# Patient Record
Sex: Male | Born: 1935 | Race: White | Hispanic: No | Smoking: Former smoker
Health system: Southern US, Community
[De-identification: ages and names within clinical notes are randomized; demographics above are authoritative.]

## PROBLEM LIST (undated history)

## (undated) DIAGNOSIS — I499 Cardiac arrhythmia, unspecified: Secondary | ICD-10-CM

## (undated) DIAGNOSIS — R0600 Dyspnea, unspecified: Secondary | ICD-10-CM

## (undated) DIAGNOSIS — I5189 Other ill-defined heart diseases: Secondary | ICD-10-CM

## (undated) DIAGNOSIS — K589 Irritable bowel syndrome without diarrhea: Secondary | ICD-10-CM

## (undated) DIAGNOSIS — K219 Gastro-esophageal reflux disease without esophagitis: Secondary | ICD-10-CM

## (undated) DIAGNOSIS — C801 Malignant (primary) neoplasm, unspecified: Secondary | ICD-10-CM

## (undated) DIAGNOSIS — I480 Paroxysmal atrial fibrillation: Secondary | ICD-10-CM

## (undated) DIAGNOSIS — K635 Polyp of colon: Secondary | ICD-10-CM

## (undated) DIAGNOSIS — I2510 Atherosclerotic heart disease of native coronary artery without angina pectoris: Secondary | ICD-10-CM

## (undated) DIAGNOSIS — K649 Unspecified hemorrhoids: Secondary | ICD-10-CM

## (undated) DIAGNOSIS — I493 Ventricular premature depolarization: Secondary | ICD-10-CM

## (undated) DIAGNOSIS — I471 Supraventricular tachycardia: Secondary | ICD-10-CM

## (undated) DIAGNOSIS — I4891 Unspecified atrial fibrillation: Secondary | ICD-10-CM

## (undated) DIAGNOSIS — H9190 Unspecified hearing loss, unspecified ear: Secondary | ICD-10-CM

## (undated) DIAGNOSIS — I10 Essential (primary) hypertension: Secondary | ICD-10-CM

## (undated) DIAGNOSIS — R0609 Other forms of dyspnea: Secondary | ICD-10-CM

## (undated) DIAGNOSIS — K449 Diaphragmatic hernia without obstruction or gangrene: Secondary | ICD-10-CM

## (undated) DIAGNOSIS — I2699 Other pulmonary embolism without acute cor pulmonale: Secondary | ICD-10-CM

## (undated) DIAGNOSIS — M1990 Unspecified osteoarthritis, unspecified site: Secondary | ICD-10-CM

## (undated) HISTORY — DX: Paroxysmal atrial fibrillation: I48.0

## (undated) HISTORY — DX: Malignant (primary) neoplasm, unspecified: C80.1

## (undated) HISTORY — DX: Unspecified hemorrhoids: K64.9

## (undated) HISTORY — DX: Atherosclerotic heart disease of native coronary artery without angina pectoris: I25.10

## (undated) HISTORY — DX: Irritable bowel syndrome, unspecified: K58.9

## (undated) HISTORY — DX: Diaphragmatic hernia without obstruction or gangrene: K44.9

## (undated) HISTORY — DX: Gastro-esophageal reflux disease without esophagitis: K21.9

## (undated) HISTORY — DX: Other ill-defined heart diseases: I51.89

## (undated) HISTORY — DX: Other forms of dyspnea: R06.09

## (undated) HISTORY — DX: Polyp of colon: K63.5

## (undated) HISTORY — DX: Supraventricular tachycardia: I47.1

## (undated) HISTORY — DX: Dyspnea, unspecified: R06.00

## (undated) HISTORY — PX: SKIN SURGERY: SHX2413

## (undated) HISTORY — PX: EYE SURGERY: SHX253

## (undated) HISTORY — DX: Other pulmonary embolism without acute cor pulmonale: I26.99

## (undated) HISTORY — DX: Ventricular premature depolarization: I49.3

## (undated) HISTORY — PX: HEMORROIDECTOMY: SUR656

---

## 2004-08-25 ENCOUNTER — Ambulatory Visit: Payer: Self-pay | Admitting: Unknown Physician Specialty

## 2006-08-23 ENCOUNTER — Ambulatory Visit: Payer: Self-pay | Admitting: Unknown Physician Specialty

## 2006-10-20 ENCOUNTER — Ambulatory Visit: Payer: Self-pay | Admitting: Ophthalmology

## 2006-11-09 ENCOUNTER — Ambulatory Visit: Payer: Self-pay | Admitting: Ophthalmology

## 2008-12-19 ENCOUNTER — Ambulatory Visit: Payer: Self-pay | Admitting: Unknown Physician Specialty

## 2009-01-09 ENCOUNTER — Ambulatory Visit: Payer: Self-pay | Admitting: Unknown Physician Specialty

## 2011-01-19 ENCOUNTER — Ambulatory Visit: Payer: Self-pay | Admitting: Unknown Physician Specialty

## 2012-09-26 DIAGNOSIS — C439 Malignant melanoma of skin, unspecified: Secondary | ICD-10-CM

## 2012-09-26 HISTORY — DX: Malignant melanoma of skin, unspecified: C43.9

## 2013-07-14 ENCOUNTER — Inpatient Hospital Stay: Payer: Self-pay | Admitting: General Surgery

## 2013-07-14 DIAGNOSIS — Q438 Other specified congenital malformations of intestine: Secondary | ICD-10-CM

## 2013-07-14 DIAGNOSIS — K562 Volvulus: Secondary | ICD-10-CM

## 2013-07-14 HISTORY — PX: COLON SURGERY: SHX602

## 2013-07-14 LAB — COMPREHENSIVE METABOLIC PANEL
ALK PHOS: 105 U/L
Albumin: 3.5 g/dL (ref 3.4–5.0)
Anion Gap: 8 (ref 7–16)
BILIRUBIN TOTAL: 0.4 mg/dL (ref 0.2–1.0)
BUN: 15 mg/dL (ref 7–18)
CREATININE: 0.96 mg/dL (ref 0.60–1.30)
Calcium, Total: 8.4 mg/dL — ABNORMAL LOW (ref 8.5–10.1)
Chloride: 105 mmol/L (ref 98–107)
Co2: 25 mmol/L (ref 21–32)
EGFR (African American): >60
Glucose: 100 mg/dL — ABNORMAL HIGH (ref 65–99)
Osmolality: 277 (ref 275–301)
Potassium: 4 mmol/L (ref 3.5–5.1)
SGOT(AST): 16 U/L (ref 15–37)
SGPT (ALT): 20 U/L (ref 12–78)
SODIUM: 138 mmol/L (ref 136–145)
TOTAL PROTEIN: 7 g/dL (ref 6.4–8.2)

## 2013-07-14 LAB — URINALYSIS, COMPLETE
BLOOD: NEGATIVE
Bacteria: NEGATIVE
Bilirubin,UR: NEGATIVE
GLUCOSE, UR: NEGATIVE mg/dL (ref 0–75)
KETONE: NEGATIVE
Leukocyte Esterase: NEGATIVE
Nitrite: NEGATIVE
PH: 5 (ref 4.5–8.0)
Protein: NEGATIVE
RBC, UR: NONE SEEN /HPF (ref 0–5)
Specific Gravity: 1.013 (ref 1.003–1.030)
WBC UR: NONE SEEN /HPF (ref 0–5)

## 2013-07-14 LAB — CBC
HCT: 47.5 % (ref 40.0–52.0)
HGB: 16.5 g/dL (ref 13.0–18.0)
MCH: 29.7 pg (ref 26.0–34.0)
MCHC: 34.7 g/dL (ref 32.0–36.0)
MCV: 86 fL (ref 80–100)
PLATELETS: 199 10*3/uL (ref 150–440)
RBC: 5.55 10*6/uL (ref 4.40–5.90)
RDW: 14.1 % (ref 11.5–14.5)
WBC: 6.9 10*3/uL (ref 3.8–10.6)

## 2013-07-14 LAB — LIPASE, BLOOD: Lipase: 103 U/L (ref 73–393)

## 2013-07-14 LAB — TROPONIN I: Troponin-I: <0.02 ng/mL

## 2013-07-15 LAB — CBC WITH DIFFERENTIAL/PLATELET
BASOS ABS: 0 10*3/uL (ref 0.0–0.1)
BASOS PCT: 0.1 %
EOS PCT: 0 %
Eosinophil #: 0 10*3/uL (ref 0.0–0.7)
HCT: 42.8 % (ref 40.0–52.0)
HGB: 14.2 g/dL (ref 13.0–18.0)
LYMPHS ABS: 1.5 10*3/uL (ref 1.0–3.6)
LYMPHS PCT: 8.8 %
MCH: 28.3 pg (ref 26.0–34.0)
MCHC: 33.1 g/dL (ref 32.0–36.0)
MCV: 86 fL (ref 80–100)
MONO ABS: 1 x10 3/mm (ref 0.2–1.0)
Monocyte %: 6 %
NEUTROS PCT: 85.1 %
Neutrophil #: 13.9 10*3/uL — ABNORMAL HIGH (ref 1.4–6.5)
Platelet: 235 10*3/uL (ref 150–440)
RBC: 5 10*6/uL (ref 4.40–5.90)
RDW: 14.1 % (ref 11.5–14.5)
WBC: 16.4 10*3/uL — AB (ref 3.8–10.6)

## 2013-07-15 LAB — BASIC METABOLIC PANEL
ANION GAP: 7 (ref 7–16)
BUN: 13 mg/dL (ref 7–18)
Calcium, Total: 7.6 mg/dL — ABNORMAL LOW (ref 8.5–10.1)
Chloride: 102 mmol/L (ref 98–107)
Co2: 25 mmol/L (ref 21–32)
Creatinine: 0.94 mg/dL (ref 0.60–1.30)
EGFR (Non-African Amer.): >60
Glucose: 167 mg/dL — ABNORMAL HIGH (ref 65–99)
OSMOLALITY: 272 (ref 275–301)
Potassium: 4.2 mmol/L (ref 3.5–5.1)
SODIUM: 134 mmol/L — AB (ref 136–145)

## 2013-07-16 LAB — CBC WITH DIFFERENTIAL/PLATELET
BASOS ABS: 0 10*3/uL (ref 0.0–0.1)
Basophil %: 0.2 %
EOS PCT: 1.3 %
Eosinophil #: 0.1 10*3/uL (ref 0.0–0.7)
HCT: 35.5 % — ABNORMAL LOW (ref 40.0–52.0)
HGB: 12.5 g/dL — AB (ref 13.0–18.0)
Lymphocyte #: 1.6 10*3/uL (ref 1.0–3.6)
Lymphocyte %: 14.4 %
MCH: 30 pg (ref 26.0–34.0)
MCHC: 35.4 g/dL (ref 32.0–36.0)
MCV: 85 fL (ref 80–100)
MONO ABS: 0.8 x10 3/mm (ref 0.2–1.0)
Monocyte %: 6.9 %
NEUTROS PCT: 77.2 %
Neutrophil #: 8.5 10*3/uL — ABNORMAL HIGH (ref 1.4–6.5)
PLATELETS: 205 10*3/uL (ref 150–440)
RBC: 4.17 10*6/uL — ABNORMAL LOW (ref 4.40–5.90)
RDW: 14 % (ref 11.5–14.5)
WBC: 11.1 10*3/uL — AB (ref 3.8–10.6)

## 2013-07-16 LAB — BASIC METABOLIC PANEL
ANION GAP: 6 — AB (ref 7–16)
BUN: 12 mg/dL (ref 7–18)
CO2: 28 mmol/L (ref 21–32)
Calcium, Total: 7.7 mg/dL — ABNORMAL LOW (ref 8.5–10.1)
Chloride: 98 mmol/L (ref 98–107)
Creatinine: 1.02 mg/dL (ref 0.60–1.30)
EGFR (African American): >60
EGFR (Non-African Amer.): >60
GLUCOSE: 132 mg/dL — AB (ref 65–99)
Osmolality: 266 (ref 275–301)
POTASSIUM: 3.8 mmol/L (ref 3.5–5.1)
Sodium: 132 mmol/L — ABNORMAL LOW (ref 136–145)

## 2013-07-17 ENCOUNTER — Encounter: Payer: Self-pay | Admitting: General Surgery

## 2013-07-17 LAB — BASIC METABOLIC PANEL
ANION GAP: 6 — AB (ref 7–16)
BUN: 9 mg/dL (ref 7–18)
Calcium, Total: 7.5 mg/dL — ABNORMAL LOW (ref 8.5–10.1)
Chloride: 104 mmol/L (ref 98–107)
Co2: 27 mmol/L (ref 21–32)
Creatinine: 0.92 mg/dL (ref 0.60–1.30)
EGFR (African American): >60
EGFR (Non-African Amer.): >60
GLUCOSE: 111 mg/dL — AB (ref 65–99)
Osmolality: 273 (ref 275–301)
POTASSIUM: 4.1 mmol/L (ref 3.5–5.1)
Sodium: 137 mmol/L (ref 136–145)

## 2013-07-18 LAB — PATHOLOGY REPORT

## 2013-07-20 ENCOUNTER — Encounter: Payer: Self-pay | Admitting: General Surgery

## 2013-07-21 ENCOUNTER — Ambulatory Visit (INDEPENDENT_AMBULATORY_CARE_PROVIDER_SITE_OTHER): Payer: Self-pay | Admitting: *Deleted

## 2013-07-21 DIAGNOSIS — K56609 Unspecified intestinal obstruction, unspecified as to partial versus complete obstruction: Secondary | ICD-10-CM

## 2013-07-21 NOTE — Progress Notes (Signed)
The staples were removed and steri strips applied. Patient came in today for a wound check.  The wound is clean, with no signs of infection noted. Follow up as scheduled.

## 2013-07-21 NOTE — Patient Instructions (Signed)
Patient to follow up as scheduled. The patient is aware to call back for any questions or concerns.  

## 2013-07-24 ENCOUNTER — Encounter: Payer: Self-pay | Admitting: General Surgery

## 2013-08-02 ENCOUNTER — Ambulatory Visit (INDEPENDENT_AMBULATORY_CARE_PROVIDER_SITE_OTHER): Payer: Self-pay | Admitting: General Surgery

## 2013-08-02 ENCOUNTER — Encounter: Payer: Self-pay | Admitting: General Surgery

## 2013-08-02 ENCOUNTER — Ambulatory Visit: Payer: Self-pay

## 2013-08-02 VITALS — BP 120/74 | HR 72 | Resp 12 | Ht 70.0 in | Wt 208.0 lb

## 2013-08-02 DIAGNOSIS — K56609 Unspecified intestinal obstruction, unspecified as to partial versus complete obstruction: Secondary | ICD-10-CM

## 2013-08-02 NOTE — Patient Instructions (Addendum)
Patient to return in three weeks.No exertional activity

## 2013-08-02 NOTE — Progress Notes (Signed)
This is a 80 year old male here today for his post op small bowel resection done on 07/14/13. Patient states he is doing okay.  Incision looks clean and well healed.Abdomen is soft and non tender, normal bowel sounds.  Lungs are clear  At surgery pt had multiple large diverticula in proximal 2 feet of jejunum causing this segment to twist on the mesentery Resection of this segment was done. Path revealed no diverticulitis but evidence of volvulus. Pt advised to avoid exertional activity for now.  F/U in 3 weeks or sooner for any abd symptoms.

## 2013-08-22 ENCOUNTER — Ambulatory Visit (INDEPENDENT_AMBULATORY_CARE_PROVIDER_SITE_OTHER): Payer: Self-pay | Admitting: General Surgery

## 2013-08-22 ENCOUNTER — Encounter: Payer: Self-pay | Admitting: General Surgery

## 2013-08-22 VITALS — BP 122/64 | HR 58 | Resp 12 | Ht 70.0 in | Wt 206.0 lb

## 2013-08-22 DIAGNOSIS — K56609 Unspecified intestinal obstruction, unspecified as to partial versus complete obstruction: Secondary | ICD-10-CM

## 2013-08-22 NOTE — Patient Instructions (Signed)
The patient is aware to call back for any questions or concerns. May resume regular activity.

## 2013-08-22 NOTE — Progress Notes (Signed)
Patient ID: Jordan Figueroa, male   DOB: [birth year], 80 y.o.   MRN: [id number]  Chief Complaint  Patient presents with  . Follow-up    small bowel obstruction    HPI Jordan Figueroa is a 80 y.o. male.  Here today for follow up small bowel obstruction. He states he is doing well, bowels moving daily, no bleeding or abdominal pain. He had small bowel resection done on 07/14/13 for volvulus of segment of jejunum with multiple large diverticula.  HPI  Past Medical History  Diagnosis Date  . IBS (irritable bowel syndrome)   . Colon polyps   . GERD (gastroesophageal reflux disease)   . Hiatal hernia   . Hemorrhoids   . Cancer     skin    Past Surgical History  Procedure Laterality Date  . Colon surgery  07/14/2013    small bowel obstruction    History reviewed. No pertinent family history.  Social History History  Substance Use Topics  . Smoking status: Former Research scientist (life sciences)  . Smokeless tobacco: Never Used  . Alcohol Use: No    No Known Allergies  Current Outpatient Prescriptions  Medication Sig Dispense Refill  . finasteride (PROSCAR) 5 MG tablet       . tamsulosin (FLOMAX) 0.4 MG CAPS capsule Take 0.4 mg by mouth daily.       No current facility-administered medications for this visit.    Review of Systems Review of Systems  Constitutional: Negative.   Respiratory: Negative.   Cardiovascular: Negative.   Gastrointestinal: Negative for abdominal pain, diarrhea, constipation and blood in stool.    Blood pressure 122/64, pulse 58, resp. rate 12, height 5\' 10"  (1.778 m), weight 206 lb (93.441 kg).  Physical Exam Physical Exam  Constitutional: He is oriented to person, place, and time. He appears well-developed and well-nourished.  Eyes: Conjunctivae are normal. No scleral icterus.  Abdominal: Soft. Normal appearance. There is no hepatosplenomegaly. There is no tenderness.  diastasis recti. Abdominal incision well healed.  Neurological: He is alert and oriented to person,  place, and time.  Skin: Skin is warm and dry.    Data Reviewed none  Assessment    Abdominal incision well healed.    Plan    Follow up as needed. May resume regular activity.       SANKAR,SEEPLAPUTHUR G 08/22/2013, 1:07 PM

## 2014-04-20 DIAGNOSIS — C4492 Squamous cell carcinoma of skin, unspecified: Secondary | ICD-10-CM

## 2014-04-20 HISTORY — DX: Squamous cell carcinoma of skin, unspecified: C44.92

## 2014-05-05 NOTE — Discharge Summary (Signed)
PATIENT NAME:  Jordan Figueroa, SALMONS MR#:  [id number] DATE OF BIRTH:  04-05-1927  DATE OF ADMISSION:  07/14/2013 DATE OF DISCHARGE:  07/19/2013  HISTORY OF PRESENT ILLNESS: This is an 81 year old, very healthy male, who presented with an ongoing history for the last 2 to 3 weeks of significant abdominal pain. The patient had been to the urgent care and to his primary care physician, who initially thought that he might have symptoms of constipation, and was recommended that he use some laxatives. The patient, however, failed to have any relief of pain. He describes the pain as being more or less persistent, located in the mid to upper abdominal area, and fairly severe at times. He has had some bowel movements and thought the pain was getting worse, and presented to the Emergency Room. His past history was significant for BPH, history of colon polyps. His last colonoscopy was about 4 to 5 years ago and that was normal. He has a history of melanoma in the distant past.  On initial evaluation in the Emergency Room, it was noted that the patient had tenderness in his abdomen, mostly in the mid and upper abdominal area without any guarding or rebound. There were no hernias. The abdomen was soft, but distended, and there were hyperactive bowel sounds.   LABORATORY DATA: Had initial chemistries which were essentially normal. White count was normal at 6900, hemoglobin was 16.   The patient underwent a CT of the abdomen and pelvis, which revealed findings consistent with a small bowel obstruction, and it appeared that he may have a volvulus involving the small intestine in the upper to mid abdominal area. There was evidence of some pneumatosis in some of the loops of the bowel. This was concerning for some vascular compromise, although the patient did not appear to be septic or acidotic from this kind of process.   Initial impression was that he had a volvulus, which was accounting for his obstructive symptoms, but also  there was evidence of pneumatosis, which was concerning for vascular compromise, and, therefore, laparotomy was recommended. The patient was agreeable.   He was taken to the operating room and found that, in fact, he had a volvulus involving the proximal 2 feet of jejunum, and this appeared to be twisted pretty much on the mesentery going towards the left side. After this was satisfactorily reversed back into position without any takedown of adhesions, it was noted that the patient had a somewhat long and thick mesentery with an extremely heavy 2 feet of   jejunum containing multiple large congenital diverticula. There was evidence of some inflammation involving the mesenteric area. There was some scarring of the lower part of the mesentery where the patient likely had twisted.However, the patient had strong pulsatile flow all the way to the bowel, which appeared normal in color. Some of the diverticula were very large measuring 6 to 7 cm in size. There were 1 or 2 scattered diverticula in the distal portion of the bowel, which appeared more or less collapsed and normal. It was decided to the resect the involvedportion of the jejunum, which was about 2 feet, with a total length of the small intestine being approximately 22 feet. With resection, reanastomosing was accomplished. The patient was  returned to floor care after surgery.   The patient had essentially an uneventful postoperative course. His abdominal pain that he experienced preoperatively was completely resolved. All he complained of was some mild incisional pain. His NG tube was draining some green bilious  material, but the quantity lessened very quickly. The patient had return of some bowel sounds with a decrease in the abdominal distention. The NG tube came out accidentally after a second postoperative day, and it was just left out. The patient subsequently had a small bowel movement and was started on a liquid diet, and advanced to a full liquid  without any difficulty.   The patient was subsequently discharged home in stable condition on July 8 to be followed as an outpatient.   FINAL DIAGNOSES:  1. 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 2. History of benign prostatic hypertrophy.   OPERATION PERFORMED: Laparotomy and small bowel resection with reanastomosis.   ____________________________ S.Robinette Haines, MD sgs:jr D: 08/09/2013 15:17:12 ET T: 08/09/2013 17:04:13 ET JOB#: [id number]  cc: S.G. Jamal Collin, MD, <Dictator> [facility] Robinette Haines MD ELECTRONICALLY SIGNED 08/10/2013 8:49

## 2014-05-05 NOTE — H&P (Signed)
Subjective/Chief Complaint abdominal pain   History of Present Illness 81 yr old healthy male presents withg 3 wk history of intermittent crampy abd pain. Was seen in urgentcare twice and treated for possible fecal retention. He has had no relief, says he has no n/v, bowels have been moving. Pain is getting worse.   Past History BPH, history of colon polyps. Last colonoscopy 4-32yr ago-normal. History of melanoma   Past Med/Surgical Hx:  prostate:   melanoma:   denie surgical hx:   ALLERGIES:  No Known Allergies:   HOME MEDICATIONS: Medication Instructions Status  Flomax 0.4 mg oral capsule 1 cap(s) orally once a day Active   Family and Social History:  Family History Non-Contributory   Place of Living Home   Review of Systems:  Subjective/Chief Complaint abd pain   Fever/Chills No   Cough No   Sputum No   Abdominal Pain Yes   Diarrhea No   Constipation No   Nausea/Vomiting No   SOB/DOE No   Chest Pain No   Dysuria No   Physical Exam:  GEN well developed, well nourished, no acute distress   HEENT pink conjunctivae, PERRL   NECK supple  No masses   RESP normal resp effort  clear BS   CARD regular rate  no murmur   ABD positive tenderness  no liver/spleen enlargement  no hernia  soft  distended  hyperactive BS  tender in mid abdomonal area, no guarding or rebound, tympanitic   LYMPH negative neck   EXTR negative cyanosis/clubbing, negative edema, strong prdal pulses   PSYCH A+O to time, place, person   Lab Results: Routine Chem:  03-Jul-15 09:30   Glucose, Serum  100  BUN 15  Creatinine (comp) 0.96  Sodium, Serum 138  Potassium, Serum 4.0  Chloride, Serum 105  CO2, Serum 25  eGFR (African American) >60  eGFR (Non-African American) >60 (eGFR values <639mmin/1.73 m2 may be an indication of chronic kidney disease (CKD). Calculated eGFR is useful in patients with stable renal function. The eGFR calculation will not be reliable in  acutely ill patients when serum creatinine is changing rapidly. It is not useful in  patients on dialysis. The eGFR calculation may not be applicable to patients at the low and high extremes of body sizes, pregnant women, and vegetarians.)  Lipase 103 (Result(s) reported on 14 Jul 2013 at 09:56AM.)  Routine Hem:  03-Jul-15 09:30   WBC (CBC) 6.9  RBC (CBC) 5.55  Hemoglobin (CBC) 16.5  Hematocrit (CBC) 47.5  Platelet Count (CBC) 199 (Result(s) reported on 14 Jul 2013 at 09:48AM.)   Radiology Results: CT:    03-Jul-15 10:22, CT Abdomen and Pelvis With Contrast  CT Abdomen and Pelvis With Contrast  REASON FOR EXAM:    (1) abdominal pain  (mid abdomen) with bloating; (2)   same;    NOTE: Nursing to G  COMMENTS:   May transport without cardiac monitor    PROCEDURE: CT  - CT ABDOMEN / PELVIS  W  - Jul 14 2013 10:22AM     CLINICAL DATA:  Abdominal pain    EXAM:  CT ABDOMEN AND PELVIS WITH CONTRAST    TECHNIQUE:  Multidetector CT imaging of the abdomen and pelvis was performed  using the standard protocol following bolus administration of  intravenous contrast.  CONTRAST:  100 mL Isovue-300    COMPARISON:  CT abdomen pelvis dated 12/19/2008    FINDINGS:  Lung bases are negative.    Multiple punctate areas  of free air adjacent to the dome of the  liver, posterior to the gastric antrum, along the central apex of  porta hepatis, and within Morison's pouch. Free air is also  appreciated within the anterior aspect of the abdomen. There is  diffuse pneumatosis throughout multiple loops of small bowel within  the left portion of the abdomen and centrally. On the coronal images  loops of small bowel demonstrate a whorled appearance centrally in  the abdomen, containing a central transition point. These findings  are concerning for a small bowel volvulus, internal hernia.    There is diverticulosis within the sigmoid colon.    There is no abdominal aortic aneurysm. The celiac, SMA,  IMA, portal  vein, SMV are opacified.    The liver is otherwise unremarkable. Multiple calcified granuloma  within the spleen. The pancreas, adrenals are unremarkable.    No abdominal or pelvic masses, free fluid, loculated fluid  collections, masses nor adenopathy. The gallbladder fossa is  unremarkable.    Stable bilateral benign-appearing renal cyst. Kidneys otherwise  unremarkable.    Multilevel spondylosis no aggressive appearing osseous lesions.     IMPRESSION:  Multiple dilated loops small bowel with areas of pneumatosis. There  are areas of free air within the abdomen. There are findings within  the central abdomen consistent with small bowel volvulus, internal  hernia, and small bowel obstruction with perforation. These results  were called by telephone at the time of interpretation on 07/14/2013  at 10:50 AM to Dr. Harvest Dark , who verbally acknowledged  these results. Surgical consultation recommended.    Diverticulosis within the colon.  Stable bilateral Bosniak type 1 cyst    Granulomatous changes within the spleen    Small hiatal hernia      Electronically Signed    By: Margaree Mackintosh M.D.    On: 07/14/2013 11:01         Verified By: Mikki Santee, M.D., MD    Assessment/Admission Diagnosis Findings consistent with bowel obstruction, perfioration. Hee is not toxic or acidotic.   Plan Exploratory laparotomy, possible bowel resction. Explained in full including procedure, risks and benefits. Pt is agreeable.   Electronic Signatures: Christene Lye (MD)  (Signed 03-Jul-15 12:36)  Authored: CHIEF COMPLAINT and HISTORY, PAST MEDICAL/SURGIAL HISTORY, ALLERGIES, HOME MEDICATIONS, FAMILY AND SOCIAL HISTORY, REVIEW OF SYSTEMS, PHYSICAL EXAM, LABS, Radiology, ASSESSMENT AND PLAN   Last Updated: 03-Jul-15 12:36 by Christene Lye (MD)

## 2014-05-05 NOTE — Op Note (Signed)
PATIENT NAME:  Jordan Figueroa, Jordan Figueroa MR#:  [id number] DATE OF BIRTH:  1927/04/16  DATE OF PROCEDURE:  07/14/2013  PREOPERATIVE DIAGNOSIS: Small bowel obstruction, possible perforation.   POSTOPERATIVE DIAGNOSES:  1.  Small bowel obstruction due to volvulus of the proximal jejunum.  2.  Multiple large diverticula of the proximal jejunum.   OPERATION PERFORMED:  1.  Exploratory laparotomy. 2.  Small bowel resection with reanastomosis.   ANESTHESIA: General.   COMPLICATIONS: None.   ESTIMATED BLOOD LOSS: 50 mL.   DRAINS: None.    SURGEON:  Mckinley Jewel, MD.  ASSISTANT: Dr. Tollie Pizza.   DESCRIPTION OF PROCEDURE: This patient was put to sleep in the supine position on the operating table. Foley catheter was inserted. Abdomen was prepped and draped as a sterile field. Timeout was performed. Vertical midline incision was made in the upper abdomen from the mid epigastrium down to the umbilicus, and carefully deepened through the layers into the peritoneal cavity.  There was no evidence of free air or any free fluid encountered.  With satisfactory exposure, exploration of the small bowel was performed. A CT scan suggested a possible volvulus. The terminal ileum was identified, then it followed all the way back towards the ligament of Treitz. It was noted that the distal half of the small bowel was relatively normal, however, approaching the area of the proximal jejunum, there appeared to be a slight twist of the mesentery with the bowels tucked underneath it.  This was then pulled out to reveal a moderately dilated proximal jejunum about 2 feet in length that appeared to be the cause of his problem. There were no adhesions to account for this; however, it was noted that this portion of the bowel to contain multiple congenital diverticula, some of them measuring up to 78 cm in size. It felt very, very bulky with some mild thickening in the wall, and there appeared to be some palpable soft fullness and in the  areas of diverticula and in the adjoining portions of the bowel. Multiple small nodules were palpated in the mesentery and there was scarring in the mesentery of this segment of the bowel suggesting the volvulus. After adequate exploration, it was decided that this was about a 2 foot length with the entire small bowel measurement being approximately 22-24 feet.  It was felt safe to resect this portion of the bowel to prevent him from doing this again. Accordingly, approximately 6 inches from the ligament of Treitz, the jejunal area was free of any abnormality.  The mesentery here was opened and just beyond the end of the row of diverticula, the small bowel mesentery was also opened. This mesentery was then scored and was then taken down with the use of the Harmonic device and also by clamping, cutting, and ligating with 2-0 Vicryl. A side-to-side anastomosis was then performed using a GIA stapling device and the remaining opening was closed and the bowel transected with a second application of the GIA. The anastomotic staple lines, that were then in the corners, were reinforced with 3-0 silk stitches. The resected portion of the bowel was subsequently opened at the end of the procedure revealing no intraluminal abnormality. The mesenteric opening was reapproximated with a running suture of 3-0 Vicryl. The bowel was then placed back in the abdominal cavity. There was a very little in the way of an omentum to cover this.  Sponge and instrument counts were declared correct x 2. The peritoneum was closed with a running suture of 2-0 Vicryl.  The fascia was then approximated with the interrupted figure-of-8 stitches of 0 PDS. The subcutaneous tissue was irrigated and the subcutaneous layer was closed with a running 2-0 Vicryl stitch, and the skin approximated with staples. Dry sterile dressing was placed. The patient tolerated the procedure well. No immediate problems were encountered. He was subsequently extubated and  returned to the recovery room in stable condition.    ____________________________ S.Robinette Haines, MD sgs:ts D: 07/15/2013 09:04:23 ET T: 07/15/2013 14:30:52 ET JOB#: [id number]  cc: Synthia Innocent. Jamal Collin, MD, <Dictator> [facility] Robinette Haines MD ELECTRONICALLY SIGNED 07/19/2013 5:43

## 2015-03-13 DIAGNOSIS — I471 Supraventricular tachycardia, unspecified: Secondary | ICD-10-CM

## 2015-03-13 HISTORY — DX: Supraventricular tachycardia: I47.1

## 2015-03-13 HISTORY — DX: Supraventricular tachycardia, unspecified: I47.10

## 2015-04-10 ENCOUNTER — Inpatient Hospital Stay
Admission: EM | Admit: 2015-04-10 | Discharge: 2015-04-13 | DRG: 308 | Disposition: A | Discharge status: Home / Self Care | Payer: Medicare Other | Attending: Internal Medicine | Admitting: Internal Medicine

## 2015-04-10 ENCOUNTER — Emergency Department: Payer: Medicare Other

## 2015-04-10 ENCOUNTER — Inpatient Hospital Stay (HOSPITAL_COMMUNITY)
Admit: 2015-04-10 | Discharge: 2015-04-10 | Disposition: A | Discharge status: Home / Self Care | Payer: Medicare Other | Attending: Specialist | Admitting: Specialist

## 2015-04-10 DIAGNOSIS — Z85828 Personal history of other malignant neoplasm of skin: Secondary | ICD-10-CM | POA: Diagnosis not present

## 2015-04-10 DIAGNOSIS — R42 Dizziness and giddiness: Secondary | ICD-10-CM | POA: Diagnosis not present

## 2015-04-10 DIAGNOSIS — I4891 Unspecified atrial fibrillation: Secondary | ICD-10-CM

## 2015-04-10 DIAGNOSIS — R002 Palpitations: Secondary | ICD-10-CM

## 2015-04-10 DIAGNOSIS — Z87891 Personal history of nicotine dependence: Secondary | ICD-10-CM | POA: Diagnosis not present

## 2015-04-10 DIAGNOSIS — M7989 Other specified soft tissue disorders: Secondary | ICD-10-CM | POA: Diagnosis present

## 2015-04-10 DIAGNOSIS — K649 Unspecified hemorrhoids: Secondary | ICD-10-CM | POA: Diagnosis present

## 2015-04-10 DIAGNOSIS — I2699 Other pulmonary embolism without acute cor pulmonale: Secondary | ICD-10-CM | POA: Diagnosis present

## 2015-04-10 DIAGNOSIS — Z79899 Other long term (current) drug therapy: Secondary | ICD-10-CM | POA: Diagnosis not present

## 2015-04-10 DIAGNOSIS — I471 Supraventricular tachycardia: Secondary | ICD-10-CM | POA: Diagnosis not present

## 2015-04-10 DIAGNOSIS — K589 Irritable bowel syndrome without diarrhea: Secondary | ICD-10-CM | POA: Diagnosis present

## 2015-04-10 DIAGNOSIS — Z86010 Personal history of colonic polyps: Secondary | ICD-10-CM | POA: Diagnosis not present

## 2015-04-10 DIAGNOSIS — J111 Influenza due to unidentified influenza virus with other respiratory manifestations: Secondary | ICD-10-CM | POA: Diagnosis not present

## 2015-04-10 DIAGNOSIS — Z823 Family history of stroke: Secondary | ICD-10-CM

## 2015-04-10 DIAGNOSIS — R0602 Shortness of breath: Secondary | ICD-10-CM

## 2015-04-10 DIAGNOSIS — K219 Gastro-esophageal reflux disease without esophagitis: Secondary | ICD-10-CM | POA: Diagnosis present

## 2015-04-10 DIAGNOSIS — K449 Diaphragmatic hernia without obstruction or gangrene: Secondary | ICD-10-CM | POA: Diagnosis present

## 2015-04-10 DIAGNOSIS — J101 Influenza due to other identified influenza virus with other respiratory manifestations: Secondary | ICD-10-CM | POA: Diagnosis present

## 2015-04-10 DIAGNOSIS — Z820 Family history of epilepsy and other diseases of the nervous system: Secondary | ICD-10-CM

## 2015-04-10 DIAGNOSIS — I248 Other forms of acute ischemic heart disease: Secondary | ICD-10-CM | POA: Diagnosis present

## 2015-04-10 DIAGNOSIS — J209 Acute bronchitis, unspecified: Secondary | ICD-10-CM | POA: Diagnosis present

## 2015-04-10 DIAGNOSIS — R609 Edema, unspecified: Secondary | ICD-10-CM

## 2015-04-10 HISTORY — PX: TRANSTHORACIC ECHOCARDIOGRAM: SHX275

## 2015-04-10 LAB — COMPREHENSIVE METABOLIC PANEL
ALT: 22 U/L (ref 17–63)
AST: 24 U/L (ref 15–41)
Albumin: 4.1 g/dL (ref 3.5–5.0)
Alkaline Phosphatase: 73 U/L (ref 38–126)
Anion gap: 7 (ref 5–15)
BUN: 16 mg/dL (ref 6–20)
CHLORIDE: 105 mmol/L (ref 101–111)
CO2: 24 mmol/L (ref 22–32)
CREATININE: 1.2 mg/dL (ref 0.61–1.24)
Calcium: 8.6 mg/dL — ABNORMAL LOW (ref 8.9–10.3)
GFR calc Af Amer: >60 mL/min (ref >60)
GFR, EST NON AFRICAN AMERICAN: 53 mL/min — AB (ref >60)
GLUCOSE: 125 mg/dL — AB (ref 65–99)
Potassium: 3.7 mmol/L (ref 3.5–5.1)
Sodium: 136 mmol/L (ref 135–145)
Total Bilirubin: 0.6 mg/dL (ref 0.3–1.2)
Total Protein: 7.2 g/dL (ref 6.5–8.1)

## 2015-04-10 LAB — CBC WITH DIFFERENTIAL/PLATELET
BASOS ABS: 0 10*3/uL (ref 0–0.1)
Basophils Relative: 0 %
Eosinophils Absolute: 0.1 10*3/uL (ref 0–0.7)
Eosinophils Relative: 1 %
HEMATOCRIT: 49.6 % (ref 40.0–52.0)
Hemoglobin: 16.7 g/dL (ref 13.0–18.0)
LYMPHS PCT: 8 %
Lymphs Abs: 0.8 10*3/uL — ABNORMAL LOW (ref 1.0–3.6)
MCH: 29.2 pg (ref 26.0–34.0)
MCHC: 33.7 g/dL (ref 32.0–36.0)
MCV: 86.7 fL (ref 80.0–100.0)
Monocytes Absolute: 0.7 10*3/uL (ref 0.2–1.0)
Monocytes Relative: 7 %
NEUTROS PCT: 84 %
Neutro Abs: 8.3 10*3/uL — ABNORMAL HIGH (ref 1.4–6.5)
PLATELETS: 176 10*3/uL (ref 150–440)
RBC: 5.72 MIL/uL (ref 4.40–5.90)
RDW: 14.1 % (ref 11.5–14.5)
WBC: 9.9 10*3/uL (ref 3.8–10.6)

## 2015-04-10 LAB — INFLUENZA PANEL BY PCR (TYPE A & B)
H1N1FLUPCR: NOT DETECTED
INFLAPCR: POSITIVE — AB
Influenza B By PCR: NEGATIVE

## 2015-04-10 LAB — MRSA PCR SCREENING: MRSA by PCR: NEGATIVE

## 2015-04-10 LAB — TROPONIN I: TROPONIN I: 0.04 ng/mL — AB (ref <0.031)

## 2015-04-10 LAB — PROTIME-INR
INR: 1.18
Prothrombin Time: 15.2 seconds — ABNORMAL HIGH (ref 11.4–15.0)

## 2015-04-10 LAB — BRAIN NATRIURETIC PEPTIDE: B Natriuretic Peptide: 87 pg/mL (ref 0.0–100.0)

## 2015-04-10 LAB — GLUCOSE, CAPILLARY: GLUCOSE-CAPILLARY: 120 mg/dL — AB (ref 65–99)

## 2015-04-10 LAB — MAGNESIUM: Magnesium: 1.9 mg/dL (ref 1.7–2.4)

## 2015-04-10 LAB — TSH: TSH: 1.329 u[IU]/mL (ref 0.350–4.500)

## 2015-04-10 MED ORDER — DILTIAZEM HCL 100 MG IV SOLR
5.0000 mg/h | Freq: Once | INTRAVENOUS | Status: AC
  Administered 2015-04-10: 5 mg/h via INTRAVENOUS
  Filled 2015-04-10: qty 100

## 2015-04-10 MED ORDER — POTASSIUM CHLORIDE CRYS ER 20 MEQ PO TBCR
20.0000 meq | EXTENDED_RELEASE_TABLET | Freq: Every day | ORAL | Status: DC
  Administered 2015-04-10 – 2015-04-13 (×4): 20 meq via ORAL
  Filled 2015-04-10 (×4): qty 1

## 2015-04-10 MED ORDER — METOPROLOL TARTRATE 1 MG/ML IV SOLN: 5.0000 mg | Freq: Once | INTRAVENOUS | Status: DC

## 2015-04-10 MED ORDER — ONDANSETRON HCL 4 MG PO TABS: 4.0000 mg | ORAL_TABLET | Freq: Four times a day (QID) | ORAL | Status: DC | PRN

## 2015-04-10 MED ORDER — ADENOSINE 12 MG/4ML IV SOLN
12.0000 mg | Freq: Once | INTRAVENOUS | Status: AC
  Administered 2015-04-10: 12 mg via INTRAVENOUS

## 2015-04-10 MED ORDER — AMIODARONE IV BOLUS ONLY 150 MG/100ML
150.0000 mg | Freq: Once | INTRAVENOUS | Status: AC
  Administered 2015-04-10: 150 mg via INTRAVENOUS
  Filled 2015-04-10: qty 100

## 2015-04-10 MED ORDER — SODIUM CHLORIDE 0.9 % IV BOLUS (SEPSIS)
1000.0000 mL | Freq: Once | INTRAVENOUS | Status: AC
  Administered 2015-04-10: 1000 mL via INTRAVENOUS

## 2015-04-10 MED ORDER — ACETAMINOPHEN 325 MG PO TABS
650.0000 mg | ORAL_TABLET | Freq: Four times a day (QID) | ORAL | Status: DC | PRN
  Administered 2015-04-10 – 2015-04-12 (×4): 650 mg via ORAL
  Filled 2015-04-10 (×4): qty 2

## 2015-04-10 MED ORDER — METOPROLOL TARTRATE 25 MG PO TABS
25.0000 mg | ORAL_TABLET | Freq: Two times a day (BID) | ORAL | Status: DC
  Administered 2015-04-11 – 2015-04-13 (×4): 25 mg via ORAL
  Filled 2015-04-10 (×5): qty 1

## 2015-04-10 MED ORDER — ONDANSETRON HCL 4 MG/2ML IJ SOLN: 4.0000 mg | Freq: Four times a day (QID) | INTRAMUSCULAR | Status: DC | PRN

## 2015-04-10 MED ORDER — SODIUM CHLORIDE 0.9% FLUSH
3.0000 mL | Freq: Two times a day (BID) | INTRAVENOUS | Status: DC
  Administered 2015-04-10 – 2015-04-13 (×6): 3 mL via INTRAVENOUS

## 2015-04-10 MED ORDER — ENOXAPARIN SODIUM 40 MG/0.4ML ~~LOC~~ SOLN: 40.0000 mg | SUBCUTANEOUS | Status: DC

## 2015-04-10 MED ORDER — ADENOSINE 12 MG/4ML IV SOLN
INTRAVENOUS | Status: AC
  Filled 2015-04-10: qty 4

## 2015-04-10 MED ORDER — DILTIAZEM HCL 25 MG/5ML IV SOLN
15.0000 mg | Freq: Once | INTRAVENOUS | Status: AC
  Administered 2015-04-10: 15 mg via INTRAVENOUS

## 2015-04-10 MED ORDER — ADENOSINE 12 MG/4ML IV SOLN
INTRAVENOUS | Status: AC
  Filled 2015-04-10: qty 8

## 2015-04-10 MED ORDER — METOPROLOL TARTRATE 25 MG PO TABS
25.0000 mg | ORAL_TABLET | Freq: Four times a day (QID) | ORAL | Status: DC
  Administered 2015-04-10: 25 mg via ORAL
  Filled 2015-04-10: qty 1

## 2015-04-10 MED ORDER — ADENOSINE 6 MG/2ML IV SOLN
6.0000 mg | Freq: Once | INTRAVENOUS | Status: AC
  Administered 2015-04-10: 6 mg via INTRAVENOUS
  Filled 2015-04-10: qty 2

## 2015-04-10 MED ORDER — ACETAMINOPHEN 650 MG RE SUPP: 650.0000 mg | Freq: Four times a day (QID) | RECTAL | Status: DC | PRN

## 2015-04-10 MED ORDER — DILTIAZEM HCL 25 MG/5ML IV SOLN
INTRAVENOUS | Status: AC
  Administered 2015-04-10: 15 mg via INTRAVENOUS
  Filled 2015-04-10: qty 5

## 2015-04-10 NOTE — ED Notes (Signed)
Attempted to call report

## 2015-04-10 NOTE — ED Notes (Signed)
Carotid massage given. Hr 96

## 2015-04-10 NOTE — ED Provider Notes (Signed)
[facility] Emergency Department Provider Note  ____________________________________________  Time seen: Approximately 1:45 PM  I have reviewed the triage vital signs and the nursing notes.   HISTORY  Chief Complaint Irregular Heart Beat    HPI Jordan Figueroa is a 82 y.o. male  who has had several episodes of tachycardia and dizziness since yesterday. Patient states he got some better last night but then got much worse again today. Patient has never had a problem with irregular heartbeat, CHF, or COPD. Patient states that his heart is beating so fast since making him dizzy and woozy feeling. Patient denies any significant chest pain associated, nausea, vomiting, or changes bowels. He states he has had a small amount of swelling on his bilateral ankles but otherwise no other acute focal deficits or findings. The patient also denies any fever, chills, cough, or any other respiratory issues other than feeling somewhat short of breath. Patient is not having any pain at this time.    Past Medical History  Diagnosis Date  . IBS (irritable bowel syndrome)   . Colon polyps   . GERD (gastroesophageal reflux disease)   . Hiatal hernia   . Hemorrhoids   . Cancer ([place])     skin    There are no active problems to display for this patient.   Past Surgical History  Procedure Laterality Date  . Colon surgery  07/14/2013    small bowel obstruction    Current Outpatient Rx  Name  Route  Sig  Dispense  Refill  . guaiFENesin (MUCINEX) 600 MG 12 hr tablet   Oral   Take 600 mg by mouth 2 (two) times daily as needed.         . pseudoephedrine (SUDAFED) 30 MG tablet   Oral   Take 30 mg by mouth every 4 (four) hours as needed for congestion.           Allergies Review of patient's allergies indicates no known allergies.  No family history on file.  Social History Social History  Substance Use Topics  . Smoking status: Former Research scientist (life sciences)  . Smokeless tobacco:  Never Used  . Alcohol Use: No    Review of Systems Constitutional: No fever/chills Eyes: No visual changes. ENT: No sore throat. Cardiovascular: Denies chest pain. patient with rapid heart rate  Respiratory:Patient with shortness of breath has been worsening over the past couple of days.. Gastrointestinal: No abdominal pain.  No nausea, no vomiting.  No diarrhea.  No constipation. Genitourinary: Negative for dysuria. Musculoskeletal: Negative for back pain. Skin: Negative for rash. Neurological: Negative for headache or focal deficits.  Patient extremely dizzy with the rapid heart rate.otherwise negative.  ____________________________________________   PHYSICAL EXAM:  VITAL SIGNS: ED Triage Vitals  Enc Vitals Group     BP 04/10/15 1354 136/57 mmHg     Pulse Rate 04/10/15 1351 64     Resp 04/10/15 1351 21     Temp 04/10/15 1354 98.6 F (37 C)     Temp Source 04/10/15 1354 Oral     SpO2 04/10/15 1351 96 %     Weight --      Height --      Head Cir --      Peak Flow --      Pain Score 04/10/15 1354 0     Pain Loc --      Pain Edu? --      Excl. in [place]? --     Constitutional: Alert and oriented.  Well appearing and in Mild distress secondary to his heart rate. The patient arrived in the ER his heart rate was in the 180s and quickly dropped to 100. Not long after patient went back to 186. Patient was given some IV Cardizem at that time. Eyes: Conjunctivae are normal. PERRL. EOMI. Head: Atraumatic. Nose: No congestion/rhinnorhea. Mouth/Throat: Mucous membranes are moist.  Oropharynx non-erythematous. Neck: No stridor.   Cardiovascular: Normal rate, regular rhythm. Grossly normal heart sounds.  Good peripheral circulation. Respiratory:  Patient with mild distress with occasional scattered wheezes throughout.  No retractions. Lungs CTAB. Gastrointestinal: Soft and nontender. No distention. No abdominal bruits. No CVA tenderness. Musculoskeletal: No lower extremity tenderness,  mild edema on the legs.  No joint effusions. Neurologic:  Normal speech and language. No gross focal neurologic deficits are appreciated. No gait instability. Skin:  Skin is warm, dry and intact. No rash noted. Psychiatric: Mood and affect are normal. Speech and behavior are normal.  ____________________________________________   LABS (all [phone] ordered are listed, but only abnormal results are displayed)  Labs Reviewed  CBC WITH DIFFERENTIAL/PLATELET - Abnormal; Notable for the following:    Neutro Abs 8.3 (*)    Lymphs Abs 0.8 (*)    All other components within normal limits  COMPREHENSIVE METABOLIC PANEL - Abnormal; Notable for the following:    Glucose, Bld 125 (*)    Calcium 8.6 (*)    GFR calc non Af Amer 53 (*)    All other components within normal limits  PROTIME-INR - Abnormal; Notable for the following:    Prothrombin Time 15.2 (*)    All other components within normal limits  TROPONIN I  BRAIN NATRIURETIC PEPTIDE   ____________________________________________  EKG  ED ECG REPORT I, Ruby Cola, the attending physician, personally viewed and interpreted this ECG.   Date: 04/10/2015  EKG Time: 1351  [id number]  Rhythm: normal EKG, normal sinus rhythm, unchanged from previous tracings, atrial fibrillation, rate 193, verses SVT  Axis: LAD  Intervals:Nonspecific intraventricular block  ST&T Change: nonspecific ST and T wave changes  ____________________________________________  RADIOLOGY Dg Chest 1 View  04/10/2015  CLINICAL DATA:  Central chest pain, cough yesterday. EXAM: CHEST 1 VIEW COMPARISON:  None. FINDINGS: Mild cardiomegaly. Lungs are clear. No effusions. No acute bony abnormality. IMPRESSION: Mild cardiomegaly.  No active disease. Electronically Signed   By: Rolm Baptise M.D.   On: 04/10/2015 14:50    ____________________________________________   PROCEDURES  Procedure(s) performed: None  Critical Care performed: Yes, see critical care  note(s) CRITICAL CARE Performed by: Ruby Cola   Total critical care time: 35 minutes  Critical care time was exclusive of separately billable procedures and treating other patients.  Critical care was necessary to treat or prevent imminent or life-threatening deterioration.  Critical care was time spent personally by me on the following activities: development of treatment plan with patient and/or surrogate as well as nursing, discussions with consultants, evaluation of patient's response to treatment, examination of patient, obtaining history from patient or surrogate, ordering and performing treatments and interventions, ordering and review of laboratory studies, ordering and review of radiographic studies, pulse oximetry and re-evaluation of patient's condition.  ____________________________________________   INITIAL IMPRESSION / ASSESSM  ertinent labs & imaging results that were available during my care of the patient were reviewed by me and considered in my medical decision making (see chart for details). 4:05 PM Pt was given 10mg  IV cardizem on arrival to the ED and once he  converted he had a rhythm that was either afib or NSR with frequent PACs.  Pt has questionable P waves.  4:05 PM Patient continued to have multiple episodes one in and out of this atrial fibrillation with RVR versus SVT. Patient was imaged time responded to IV Cardizem boluses therefore we decided to place him on IV Cardizem drip at 5 mg per hour. Patient still is pain-free and otherwise symptom-free at this time. The hospitalist was consulted for admission. ____________________________________________   FINAL CLINICAL IMPRESSION(S) / ED DIAGNOSES  Final diagnoses:  Atrial fibrillation with RVR ([place])  SVT (supraventricular tachycardia) ([place])  Dizziness       Ruby Cola, MD 04/10/15 1605

## 2015-04-10 NOTE — H&P (Signed)
[place] [facility] [place] NAME: Jordan Figueroa    MR#:  [id number]  DATE OF BIRTH:  12-May-1927  DATE OF ADMISSION:  04/10/2015  PRIMARY CARE PHYSICIAN: Juluis Pitch, MD   REQUESTING/REFERRING PHYSICIAN: Dr. Marta Antu  CHIEF COMPLAINT:   Chief Complaint  Patient presents with  . Irregular Heart Beat    HISTORY OF PRESENT ILLNESS:  Jordan Figueroa  is a 82 y.o. male with a known history of Irritable bowel syndrome, GERD, hiatal hernia who presents to the hospital due to shortness of breath, lower extremity edema and noted to be in SVT. She says that he has had worsening lower extremity edema now for the past few days and also has had some exertional dyspnea. He made an appointment to his primary care physician and when he arrived though today he was noted to be tachycardic with heart rates in the 150s to 160s. He was sent to the ER for further evaluation. In the emergency room patient received a couple doses of IV Cardizem which slowed his heart rate down but it went right back up into the 150s to 160s.  I also gave the patient 2 doses of adenosine which slowed his heart rate down but he went right back up into the 150s to 160s. He denies any chest pain, nausea, vomiting, abdominal pain, or any other associated symptoms. Given his persistent SVT hospitalist services were contacted further treatment and evaluation.  PAST MEDICAL HISTORY:   Past Medical History  Diagnosis Date  . IBS (irritable bowel syndrome)   . Colon polyps   . GERD (gastroesophageal reflux disease)   . Hiatal hernia   . Hemorrhoids   . Cancer ([place])     skin    PAST SURGICAL HISTORY:   Past Surgical History  Procedure Laterality Date  . Colon surgery  07/14/2013    small bowel obstruction    SOCIAL HISTORY:   Social History  Substance Use Topics  . Smoking status: Former Smoker -- 20.00 packs/day for 2 years    Types: Cigarettes  . Smokeless tobacco: Never  Used  . Alcohol Use: No    FAMILY HISTORY:   Family History  Problem Relation Age of Onset  . CVA Father   . Alzheimer's disease Mother     DRUG ALLERGIES:  No Known Allergies  REVIEW OF SYSTEMS:   Review of Systems  Constitutional: Negative for fever and weight loss.  HENT: Negative for congestion, nosebleeds and tinnitus.   Eyes: Negative for blurred vision, double vision and redness.  Respiratory: Positive for shortness of breath. Negative for cough and hemoptysis.   Cardiovascular: Positive for chest pain, palpitations and leg swelling. Negative for orthopnea and PND.  Gastrointestinal: Negative for nausea, vomiting, abdominal pain, diarrhea and melena.  Genitourinary: Negative for dysuria, urgency and hematuria.  Musculoskeletal: Negative for joint pain and falls.  Neurological: Negative for dizziness, tingling, sensory change, focal weakness, seizures, weakness and headaches.  Endo/Heme/Allergies: Negative for polydipsia. Does not bruise/bleed easily.  Psychiatric/Behavioral: Negative for depression and memory loss. The patient is not nervous/anxious.     MEDICATIONS AT HOME:   Prior to Admission medications   Medication Sig Start Date End Date Taking? Authorizing Provider  guaiFENesin (MUCINEX) 600 MG 12 hr tablet Take 600 mg by mouth 2 (two) times daily as needed.   Yes Historical Provider, MD  pseudoephedrine (SUDAFED) 30 MG tablet Take 30 mg by mouth every 4 (four) hours as needed for congestion.  Yes Historical Provider, MD      VITAL SIGNS:  Blood pressure 125/65, pulse 84, temperature 100.9 F (38.3 C), temperature source Oral, resp. rate 20, SpO2 95 %.  PHYSICAL EXAMINATION:  Physical Exam  GENERAL:  82 y.o.-year-old patient lying in the bed in no acute distress.  EYES: Pupils equal, round, reactive to light and accommodation. No scleral icterus. Extraocular muscles intact.  HEENT: Head atraumatic, normocephalic. Oropharynx and nasopharynx clear. No  oropharyngeal erythema, moist oral mucosa  NECK:  Supple, no jugular venous distention. No thyroid enlargement, no tenderness.  LUNGS: Normal breath sounds bilaterally, no wheezing, rales, rhonchi. No use of accessory muscles of respiration.  CARDIOVASCULAR: S1, S2 Tachycardic. No murmurs, rubs, gallops, clicks.  ABDOMEN: Soft, nontender, nondistended. Bowel sounds present. No organomegaly or mass.  EXTREMITIES: +1 edema b/l, No cyanosis, or clubbing. + 2 pedal & radial pulses b/l.   NEUROLOGIC: Cranial nerves II through XII are intact. No focal Motor or sensory deficits appreciated b/l PSYCHIATRIC: The patient is alert and oriented x 3. Good affect.  SKIN: No obvious rash, lesion, or ulcer.   LABORATORY PANEL:   CBC  Recent Labs Lab 04/10/15 1356  WBC 9.9  HGB 16.7  HCT 49.6  PLT 176   ------------------------------------------------------------------------------------------------------------------  Chemistries   Recent Labs Lab 04/10/15 1356  NA 136  K 3.7  CL 105  CO2 24  GLUCOSE 125*  BUN 16  CREATININE 1.20  CALCIUM 8.6*  AST 24  ALT 22  ALKPHOS 73  BILITOT 0.6   ------------------------------------------------------------------------------------------------------------------  Cardiac Enzymes  Recent Labs Lab 04/10/15 1356  TROPONINI <0.03   ------------------------------------------------------------------------------------------------------------------  RADIOLOGY:  Dg Chest 1 View  04/10/2015  CLINICAL DATA:  Central chest pain, cough yesterday. EXAM: CHEST 1 VIEW COMPARISON:  None. FINDINGS: Mild cardiomegaly. Lungs are clear. No effusions. No acute bony abnormality. IMPRESSION: Mild cardiomegaly.  No active disease. Electronically Signed   By: Rolm Baptise M.D.   On: 04/10/2015 14:50     IMPRESSION AND PLAN:   82 year old male with past medical history of GERD, hiatal hernia, irritable bowel syndrome presents to the hospital due to palpitations,  shortness of breath and noted to be in SVT.    #1 SVT-this is the cause of patient's palpitations. -It has not improved with Adenocard. Patient has been started on a Cardizem drip which I will continue. -I will also add oral metoprolol. I will get a cardiology consult. Check a magnesium level. -He is hemodynamically stable.  #2 shortness of breath-etiology unclear possibly related to underlying SVT. -I will hold off on IV diuretics. Check a two-dimensional echocardiogram. Get a cardiology consult. -Cycle his cardiac markers.    All the records are reviewed and case discussed with ED provider. Management plans discussed with the patient, family and they are in agreement.  CODE STATUS: Full code  TOTAL Critical Care TIME TAKING CARE OF THIS PATIENT: 50 minutes.    Henreitta Leber M.D on 04/10/2015 at 4:39 PM  Between 7am to 6pm - Pager - [phone]  After 6pm go to [email or web address] - password [email or web address] [facility]  Office  [phone]  CC: Primary care physician; Juluis Pitch, MD

## 2015-04-10 NOTE — ED Notes (Signed)
Ice packs placed on neck

## 2015-04-10 NOTE — ED Notes (Signed)
MD at bedside. 

## 2015-04-10 NOTE — ED Notes (Signed)
Pt sts he is feeling "less dizzy".  HR 95

## 2015-04-10 NOTE — ED Notes (Signed)
Per MD, keep pt of cardizem drip and give 25 mg Lopressor PO

## 2015-04-10 NOTE — ED Notes (Addendum)
MD at bedside. MD has pt cough, drink cold water and do valsalva maneuver.  Pts HR decreases w/ cough and carotid massage.

## 2015-04-10 NOTE — ED Notes (Signed)
Pt placed on 2L O2 

## 2015-04-10 NOTE — ED Notes (Signed)
Pt sent over from [facility] w/ c/o HR 190.  Pt A/O x 4, NAD.  Pt placed on ED monitor and HR 109. Pt c/o CP r/t cough.  Pt sts he went to [facility] for leg swelling.  While being triage pts HR 196.  Pt sts that he is having a "dizzy spell".

## 2015-04-10 NOTE — Consult Note (Signed)
Cardiology Consultation Note  Patient ID: Jordan Figueroa, MRN: [id number], DOB/AGE: 1927-04-30 82 y.o. Admit date: 04/10/2015   Date of Consult: 04/10/2015 Primary Physician: Juluis Pitch, MD Primary Cardiologist: New to [facility]  Chief Complaint: Weakness, SOB, and lower extremity swelling x 6 weeks Reason for Consult: New onset SVT  HPI: 82 y.o. male with h/o chronic SOB, obesity, melanoma, and hiatal hernia who presented to [facility] on 3/29 with a 6 week history of persistent SOB and a 1 day history of lower extremity swelling after visiting his PCP. He was found to be in new onset SVT with heart rates into the 160s. Cardiology is consulted for further evaluation.   He has no previously known cardiac history and has never seen a cardiologist before. He previously walked 4 miles daily up until approximately 6 weeks ago when he began to develop right foot pain. He has was diagnosed with plantar fasciitis. Since this issue his exercise tolerance has declined significantly and he now gets SOB with minimal ambulation. Never with chest pain. Today he noticed bilateral lower extremity swelling prompting him to go see his PCP. Upon his arrival he was noted to be tachycardic and was sent to the ED for further evaluation. He denies any orthopnea or PND. He does report early satiety.   Upon the patient's arrival to [facility] they were found to have troponin negative x 1, BNP 87, unremarkable CBC, K+ 3.7, Mg++ 1.9, SCr 1.20. Multiple ECGs described in detail below, CXR showed mild cardiomegaly without active disease. He has received adenosine 12 mg x 1 and IV Cardizem 10 in the ED with brief conversion to NSR, subsequently going back into SVT qith HR in the 130's. On telemetry in the ED has just converted back to NSR with PACs with HR in the 90's on metoprolol and diltiazem gtt.      Past Medical History  Diagnosis Date  . IBS (irritable bowel syndrome)   . Colon polyps   . GERD (gastroesophageal reflux disease)   .  Hiatal hernia   . Hemorrhoids   . Cancer ([place])     skin      Most Recent Cardiac Studies: None.   Surgical History:  Past Surgical History  Procedure Laterality Date  . Colon surgery  07/14/2013    small bowel obstruction     Home Meds: Prior to Admission medications   Medication Sig Start Date End Date Taking? Authorizing Provider  guaiFENesin (MUCINEX) 600 MG 12 hr tablet Take 600 mg by mouth 2 (two) times daily as needed.   Yes Historical Provider, MD  pseudoephedrine (SUDAFED) 30 MG tablet Take 30 mg by mouth every 4 (four) hours as needed for congestion.   Yes Historical Provider, MD    Inpatient Medications:  . metoprolol tartrate  25 mg Oral Q6H      Allergies: No Known Allergies  Social History   Social History  . Marital Status: Widowed    Spouse Name: N/A  . Number of Children: N/A  . Years of Education: N/A   Occupational History  . Not on file.   Social History Main Topics  . Smoking status: Former Smoker -- 20.00 packs/day for 2 years    Types: Cigarettes  . Smokeless tobacco: Never Used  . Alcohol Use: No  . Drug Use: No  . Sexual Activity: Not on file   Other Topics Concern  . Not on file   Social History Narrative     Family History  Problem Relation  Age of Onset  . CVA Father   . Alzheimer's disease Mother      Review of Systems: Review of Systems  Constitutional: Positive for malaise/fatigue. Negative for fever, chills, weight loss and diaphoresis.  HENT: Negative for congestion, ear pain, hearing loss and sore throat.   Eyes: Negative for discharge and redness.  Respiratory: Positive for shortness of breath. Negative for cough, hemoptysis, sputum production, wheezing and stridor.   Cardiovascular: Positive for leg swelling. Negative for chest pain, palpitations, orthopnea, claudication and PND.  Gastrointestinal: Negative for heartburn, nausea, vomiting, abdominal pain, diarrhea, constipation, blood in stool and melena.    Genitourinary: Negative for hematuria.  Musculoskeletal: Positive for joint pain. Negative for myalgias, back pain, falls and neck pain.       Long standing right foot pain  Skin: Negative for rash.  Neurological: Positive for weakness. Negative for dizziness, tingling, tremors, sensory change, speech change, focal weakness, seizures, loss of consciousness and headaches.  Endo/Heme/Allergies: Does not bruise/bleed easily.  Psychiatric/Behavioral: Negative for substance abuse. The patient is not nervous/anxious.   All other systems reviewed and are negative.   Labs:  Recent Labs  04/10/15 1356  TROPONINI <0.03   Lab Results  Component Value Date   WBC 9.9 04/10/2015   HGB 16.7 04/10/2015   HCT 49.6 04/10/2015   MCV 86.7 04/10/2015   PLT 176 04/10/2015    Recent Labs Lab 04/10/15 1356  NA 136  K 3.7  CL 105  CO2 24  BUN 16  CREATININE 1.20  CALCIUM 8.6*  PROT 7.2  BILITOT 0.6  ALKPHOS 73  ALT 22  AST 24  GLUCOSE 125*   No results found for: CHOL, HDL, LDLCALC, TRIG No results found for: DDIMER  Radiology/Studies:  Dg Chest 1 View  04/10/2015  CLINICAL DATA:  Central chest pain, cough yesterday. EXAM: CHEST 1 VIEW COMPARISON:  None. FINDINGS: Mild cardiomegaly. Lungs are clear. No effusions. No acute bony abnormality. IMPRESSION: Mild cardiomegaly.  No active disease. Electronically Signed   By: Rolm Baptise M.D.   On: 04/10/2015 14:50    EKG: 13:47 sinus tachycardia with PACs with supraventricular bigeminy, 105 bpm, 1st degree AV block, bifascicular block, old anterior MI. 13:51 SVT, 193 bpm, bifascicular block, old anterior MI, nonspecific st/t changes. 14:02 NSR with PACs, 91 bpm, left axis deviation, bifascicular block, old anterior MI.   Weights: There were no vitals filed for this visit.   Physical Exam: Blood pressure 136/77, pulse 163, temperature 100.9 F (38.3 C), temperature source Oral, resp. rate 23, SpO2 92 %. There is no weight on file to  calculate BMI. General: Well developed, well nourished, in no acute distress. Head: Normocephalic, atraumatic, sclera non-icteric, no xanthomas, nares are without discharge.  Neck: Negative for carotid bruits. JVD not elevated. Lungs: Clear bilaterally to auscultation without wheezes, rales, or rhonchi. Breathing is unlabored. Heart: Irregular with S1 S2. No murmurs, rubs, or gallops appreciated. Patient intermittently going into SVT with HR in the 160's. During exam he was in NSR with frequent PACs.  Abdomen: Obese, soft, non-tender, non-distended with normoactive bowel sounds. No hepatomegaly. No rebound/guarding. No obvious abdominal masses. Msk:  Strength and tone appear normal for age. Extremities: No clubbing or cyanosis. Trace pre-tibial edema along the bilateral lower extremities.  Distal pedal pulses are 2+ and equal bilaterally. Neuro: Alert and oriented X 3. No facial asymmetry. No focal deficit. Moves all extremities spontaneously. Psych:  Responds to questions appropriately with a normal affect.    Assessment  and Plan:   1. New onset SVT: -Heart rates intermittently into the 130's to 160's when he is in SVT -He has received PO metoprolol and is on diltiazem gtt and has converted to NSR with frequent PACs in the ED several times, each time lasting longer in sinus rhythm than the last -He will break with adenosine for approximately 10-12 seconds then go back into SVT -He does not feel his tachy-palpitations, but is symptomatic with his SOB -His blood pressure is beginning to become somewhat soft with readings in the 0000000 systolic -As his rate controlling medication load continues to become therapeutic his heart rates should begin to improve -Give 150 mg of IV amiodarone bolus over 60 minutes in an effort to convert to sinus and help prevent Afib -Continue PO metoprolol with next dose in AM of 3/30 -If he converts to NSR on amiodarone may want to taper diltiazem gtt and put back on  BB given his AV block -Uncertain precipitating event at this time given potassium of 3.7, magnesium of 1.9, negative troponin, and negative BNP -Will add on TSH to assess thyroid function -Given his low grade fever we have also asked IM to check for influenza  -Check echocardiogram when he is rate controlled to assess LV systolic function, wall motion, and right-sided pressure -His baseline sinus rhythm rate is in the 90's to low 100's. This may be in the setting of some dehydration vs his low-grade fever. He may benefit from gentle IV hydration and close monitoring of his temperature -He does have a 1st degree AV block with a PR interval of 233 msec on EKG. Will need to be cautious with AV nodal blocking agents  2. Lower extremity swelling/SOB: -Negative BNP -Check echo as above   -Albumin 4.1 -May benefit from compression hose   3. Hypokalemia: -Replete to 4.0  4. Obesity: -Weight loss advised -Would benefit from outpatient sleep study   5. Prior tobacco abuse: -No formal diagnosis of COPD   Signed, [name], PA-C Pager: [phone] 04/10/2015, 4:44 PM  I have seen, examined and evaluated the patient on 3/29 along with Mr. Idolina Primer, [place].  After reviewing all the available data and chart,  I agree with his findings, examination as well as impression recommendations.  Very pleasant elderly gentleman with no prior cardiac history who presented to [facility] emergency room after being seen by his PCP for edema that is minimal today. He also has been noted to have some rapid heart rates. In the emergency room he was noted to have a low-grade fever, and has had a productive cough over last couple days. Telemetry showed SVT initially in the 190s, currently in the 140-160 range.  He is been treated with IV diltiazem and now is on IV diltiazem drip. He has been given 6 and then 12 mg of atropine, and I gave him an additional 12 of atropine while in the emergency room. This temporarily broke the  SVT as did vagal maneuvers, but he quickly went reverted back into SVT.  Besides being a little short of breath of the SVT, he is not necessarily symptomatic with it. Does feel somewhat flushed and hot. Exam reveals tachycardia with no obvious murmurs rubs or gallops. Lung exam shows mild upper airway x-ray wheeze but no significant rhonchi. He does have a cough that sounds relatively wet but not apparently productive.  I discussed recommendations in detail with Thurmond Butts, and agree with his note above. I would prefer to see if we could  potentially convert him to sinus rhythm with IV amiodarone bolus, continue carefully with ties and drip overnight and plan for oral beta blocker tomorrow morning  When he is in sinus rhythm and he does have first-degree AV block, therefore we would need to be cautious for how much AV nodal agents will use for discharge. I agree with echocardiogram to evaluate his dyspnea and edema. I would expect him to have mild troponin elevation with prolonged SVT. In the absence of anginal symptoms with a heart rate of 160 beats a minute, don't think he needs an ischemic evaluation at this time.   I personally spent close to 45 minutes with the patient with at least 30 minutes of it being a care management of SVT during adenosine and performing vagal maneuvers.    Leonie Man, M.D., M.S. Interventional Cardiologist   Pager # [phone] Phone # [phone] [address] [place], [place] [address]

## 2015-04-11 ENCOUNTER — Encounter: Payer: Self-pay | Admitting: Radiology

## 2015-04-11 ENCOUNTER — Inpatient Hospital Stay: Payer: Medicare Other

## 2015-04-11 DIAGNOSIS — J111 Influenza due to unidentified influenza virus with other respiratory manifestations: Secondary | ICD-10-CM

## 2015-04-11 DIAGNOSIS — R42 Dizziness and giddiness: Secondary | ICD-10-CM | POA: Insufficient documentation

## 2015-04-11 LAB — ECHOCARDIOGRAM COMPLETE
Height: 71 in
Weight: 3608.49 oz

## 2015-04-11 LAB — TROPONIN I
TROPONIN I: 0.06 ng/mL — AB (ref <0.031)
Troponin I: 0.05 ng/mL — ABNORMAL HIGH (ref <0.031)

## 2015-04-11 LAB — GLUCOSE, CAPILLARY: GLUCOSE-CAPILLARY: 134 mg/dL — AB (ref 65–99)

## 2015-04-11 MED ORDER — GUAIFENESIN 100 MG/5ML PO SOLN
5.0000 mL | ORAL | Status: DC | PRN
  Administered 2015-04-11: 100 mg via ORAL
  Filled 2015-04-11: qty 10

## 2015-04-11 MED ORDER — RIVAROXABAN 20 MG PO TABS: 20.0000 mg | ORAL_TABLET | Freq: Every day | ORAL | Status: DC

## 2015-04-11 MED ORDER — AMIODARONE HCL 200 MG PO TABS
400.0000 mg | ORAL_TABLET | Freq: Two times a day (BID) | ORAL | Status: DC
  Administered 2015-04-11 – 2015-04-12 (×4): 400 mg via ORAL
  Filled 2015-04-11 (×4): qty 2

## 2015-04-11 MED ORDER — RIVAROXABAN 15 MG PO TABS
15.0000 mg | ORAL_TABLET | Freq: Two times a day (BID) | ORAL | Status: DC
  Administered 2015-04-11 – 2015-04-13 (×4): 15 mg via ORAL
  Filled 2015-04-11 (×4): qty 1

## 2015-04-11 MED ORDER — OSELTAMIVIR PHOSPHATE 30 MG PO CAPS
30.0000 mg | ORAL_CAPSULE | Freq: Two times a day (BID) | ORAL | Status: DC
  Administered 2015-04-11 – 2015-04-13 (×6): 30 mg via ORAL
  Filled 2015-04-11 (×6): qty 1

## 2015-04-11 MED ORDER — CEFUROXIME AXETIL 500 MG PO TABS
500.0000 mg | ORAL_TABLET | Freq: Two times a day (BID) | ORAL | Status: DC
  Administered 2015-04-11 – 2015-04-13 (×5): 500 mg via ORAL
  Filled 2015-04-11 (×6): qty 1

## 2015-04-11 MED ORDER — FUROSEMIDE 10 MG/ML IJ SOLN
20.0000 mg | Freq: Two times a day (BID) | INTRAMUSCULAR | Status: DC
  Administered 2015-04-11 – 2015-04-12 (×4): 20 mg via INTRAVENOUS
  Filled 2015-04-11 (×4): qty 2

## 2015-04-11 MED ORDER — IOPAMIDOL (ISOVUE-370) INJECTION 76%
100.0000 mL | Freq: Once | INTRAVENOUS | Status: AC | PRN
  Administered 2015-04-11: 100 mL via INTRAVENOUS

## 2015-04-11 MED ORDER — IBUPROFEN 400 MG PO TABS
400.0000 mg | ORAL_TABLET | Freq: Four times a day (QID) | ORAL | Status: DC | PRN
  Administered 2015-04-11: 400 mg via ORAL
  Filled 2015-04-11: qty 1

## 2015-04-11 NOTE — Progress Notes (Signed)
Patient: Jordan Figueroa / Admit Date: 04/10/2015 / Date of Encounter: 04/11/2015, 9:03 AM   Subjective: Patient tested positive for influenza A. Echo showed EF 60-65%, no RWMA, GR1DD, left atrium normal in size, RV systolic function normal, PASP 38 mm Hg. SOB improving. Febrile with Tmax 102.4 overnight.   Review of Systems: Review of Systems  Constitutional: Positive for fever, chills, weight loss and malaise/fatigue. Negative for diaphoresis.  HENT: Positive for congestion.   Eyes: Negative for discharge and redness.  Respiratory: Positive for cough and shortness of breath. Negative for hemoptysis, sputum production and wheezing.   Cardiovascular: Positive for leg swelling. Negative for chest pain, palpitations, orthopnea, claudication and PND.  Gastrointestinal: Negative for nausea, vomiting and abdominal pain.  Musculoskeletal: Positive for myalgias. Negative for falls.  Skin: Negative for rash.  Neurological: Positive for weakness and headaches. Negative for dizziness, tingling, tremors, sensory change, speech change, focal weakness and loss of consciousness.  Endo/Heme/Allergies: Does not bruise/bleed easily.  Psychiatric/Behavioral: The patient is not nervous/anxious.     Objective: Telemetry: converted from SVT to sinus rhythm at 6:09 PM on 3/29 and has remained in sinus rhythm with a heart rate in the 70's since Physical Exam: Blood pressure 129/57, pulse 72, temperature 101.2 F (38.4 C), temperature source Oral, resp. rate 17, height 5\' 11"  (1.803 m), weight 225 lb 8.5 oz (102.3 kg), SpO2 97 %. Body mass index is 31.47 kg/(m^2). General: Well developed, well nourished, in no acute distress. Head: Normocephalic, atraumatic, sclera non-icteric, no xanthomas, nares are without discharge. Neck: Negative for carotid bruits. JVP not elevated. Lungs: Clear bilaterally to auscultation without wheezes, rales, or rhonchi. Breathing is unlabored. Heart: RRR S1 S2 without murmurs, rubs,  or gallops.  Abdomen: Obese, soft, non-tender, non-distended with normoactive bowel sounds. No rebound/guarding. Extremities: No clubbing or cyanosis. No edema. Distal pedal pulses are 2+ and equal bilaterally. Neuro: Alert and oriented X 3. Moves all extremities spontaneously. Psych:  Responds to questions appropriately with a normal affect.   Intake/Output Summary (Last 24 hours) at 04/11/15 0903 Last data filed at 04/11/15 0700  Gross per 24 hour  Intake    425 ml  Output    700 ml  Net   -275 ml    Inpatient Medications:  . enoxaparin (LOVENOX) injection  40 mg Subcutaneous Q24H  . metoprolol tartrate  25 mg Oral BID  . oseltamivir  30 mg Oral BID  . potassium chloride  20 mEq Oral Daily  . sodium chloride flush  3 mL Intravenous Q12H   Infusions:    Labs:  Recent Labs  04/10/15 1356  NA 136  K 3.7  CL 105  CO2 24  GLUCOSE 125*  BUN 16  CREATININE 1.20  CALCIUM 8.6*  MG 1.9    Recent Labs  04/10/15 1356  AST 24  ALT 22  ALKPHOS 73  BILITOT 0.6  PROT 7.2  ALBUMIN 4.1    Recent Labs  04/10/15 1356  WBC 9.9  NEUTROABS 8.3*  HGB 16.7  HCT 49.6  MCV 86.7  PLT 176    Recent Labs  04/10/15 1356 04/10/15 2032 04/11/15 0018 04/11/15 0335  TROPONINI <0.03 0.04* 0.05* 0.06*   Invalid input(s): POCBNP No results for input(s): HGBA1C in the last 72 hours.   Weights: Filed Weights   04/10/15 1800  Weight: 225 lb 8.5 oz (102.3 kg)     Radiology/Studies:  Dg Chest 1 View  04/10/2015  CLINICAL DATA:  Central chest pain, cough yesterday.  EXAM: CHEST 1 VIEW COMPARISON:  None. FINDINGS: Mild cardiomegaly. Lungs are clear. No effusions. No acute bony abnormality. IMPRESSION: Mild cardiomegaly.  No active disease. Electronically Signed   By: Rolm Baptise M.D.   On: 04/10/2015 14:50     Assessment and Plan   1. New onset SVT: -Converted to sinus rhythm with heart rate in the 70's at 6:09 PM on 3/29 s/p amiodarone bolus and has remained in sinus  rhythm since -Will place patient on PO amiodarone 400 mg bid for now while he is dealing with influenza A as he is at high risk of recurrent arrhythmia -Continue metoprolol 25 mg bid  2. Influenza A: -Tamiflu per IM -Supportive care  3. Lower extremity swelling/SOB: -Negative BNP -Echo showed EF 60-65%, no RWMA, GR1DD, mild MR, left atrium normal in size, RV systolic function normal, PASP 38 mm Hg -Consider compression hose -SOB possibly in the setting of deconditioning and influenza A  4. Elevated troponin: -Likely supply demand ischemia in the setting of the patient's SVT and influenza A -No angina  -Normal echo  5. Hypokalemia:   -Bmet pending   Melvern Banker, PA-C Pager: [phone] 04/11/2015, 9:03 AM   Attending Note Patient seen and examined, agree with detailed note above,  Patient presentation and plan discussed on rounds.   General malaise today, flu positive Now on Tamiflu Normal sinus rhythm with no SVT since amiodarone started Now on amiodarone by mouth dose twice a day Poor appetite this morning otherwise stable No change on clinical exam, lungs with coarse breath sounds, soft abdomen, heart rate regular  --- Would continue amiodarone 400 mg twice a day for SVT, We can wean this off later once flu symptoms have resolved   Signed: Esmond Plants  M.D., Ph.D. [facility]

## 2015-04-11 NOTE — Progress Notes (Signed)
Pt has remained A & O x 4. Bilateral upper lobe expiratory wheezes on 3LNC. Pt has been in sinus rhythm with multifocal PVCs. Cardizem gtt has been d/c today and pt is now on Amiodarone PO. Pt was generally appearing unwell today upon shift assessment with a Tmax of 101.2-PRN 650mg  APAP given. Since this am pt appears better and has been more talkative. Daughter at bedside. Orders to transfer pt to floor and report given to Lavella Lemons, Therapist, sports.

## 2015-04-11 NOTE — Care Management (Signed)
Patient presented to ED from [facility] for irregular heart beat.  Admitted to icu stepdown and for transferred out to 2A.  There is discussion regarding need for chronic anticoagulation

## 2015-04-11 NOTE — Progress Notes (Signed)
Patient resting overnight, no complaints of pain or shortness of breath. Cardiazem continues at 5mg /hr until bag complete. No SVT or significant ectopy noted. Daughter at bedside updated on POC. See [facility] for further assessment. Will continue to assess for changes/need.

## 2015-04-11 NOTE — Progress Notes (Signed)
[place] at [facility]                                                                                                                                                                                            Patient Demographics   Jordan Figueroa, is a 82 y.o. male, DOB - 03-20-1927, [id number]  Admit date - 04/10/2015   Admitting Physician Henreitta Leber, MD  Outpatient Primary MD for the patient is Jordan Lovie Macadamia, MD   LOS - 1  Subjective: Patient feeling little better but still short of breath states that he has a dry cough. No further SVT    Review of Systems:   CONSTITUTIONAL: No documented fever. No fatigue, weakness. No weight gain, no weight loss.  EYES: No blurry or double vision.  ENT: No tinnitus. No postnasal drip. No redness of the oropharynx.  RESPIRATORY: Positive cough, no wheeze, no hemoptysis. Positive dyspnea.  CARDIOVASCULAR: No chest pain. No orthopnea. No palpitations. No syncope.  GASTROINTESTINAL: No nausea, no vomiting or diarrhea. No abdominal pain. No melena or hematochezia.  GENITOURINARY: No dysuria or hematuria.  ENDOCRINE: No polyuria or nocturia. No heat or cold intolerance.  HEMATOLOGY: No anemia. No bruising. No bleeding.  INTEGUMENTARY: No rashes. No lesions.  MUSCULOSKELETAL: No arthritis. No swelling. No gout.  NEUROLOGIC: No numbness, tingling, or ataxia. No seizure-type activity.  PSYCHIATRIC: No anxiety. No insomnia. No ADD.    Vitals:   Filed Vitals:   04/11/15 0800 04/11/15 0900 04/11/15 0918 04/11/15 0938  BP: 112/56 121/61  121/61  Pulse: 72 77  77  Temp: 101.2 F (38.4 C)  100.6 F (38.1 C)   TempSrc: Oral  Oral   Resp: 18 17    Height:      Weight:      SpO2: 93% 94%      Wt Readings from Last 3 Encounters:  04/10/15 102.3 kg (225 lb 8.5 oz)  08/22/13 93.441 kg (206 lb)  08/02/13 94.348 kg (208 lb)     Intake/Output Summary (Last 24 hours) at 04/11/15 1048 Last data filed  at 04/11/15 0700  Gross per 24 hour  Intake    425 ml  Output    700 ml  Net   -275 ml    Physical Exam:   GENERAL: Pleasant-appearing in no apparent distress.  HEAD, EYES, EARS, NOSE AND THROAT: Atraumatic, normocephalic. Extraocular muscles are intact. Pupils equal and reactive to light. Sclerae anicteric. No conjunctival injection. No oro-pharyngeal erythema.  NECK: Supple. There is no jugular venous distention. No bruits, no lymphadenopathy, no thyromegaly.  HEART: Regular rate and rhythm,.  No murmurs, no rubs, no clicks.  LUNGS: Clear to auscultation bilaterally. No rales or rhonchi. No wheezes.  ABDOMEN: Soft, flat, nontender, nondistended. Has good bowel sounds. No hepatosplenomegaly appreciated.  EXTREMITIES: No evidence of any cyanosis, clubbing, or 2+ peripheral edema.  +2 pedal and radial pulses bilaterally.  NEUROLOGIC: The patient is alert, awake, and oriented x3 with no focal motor or sensory deficits appreciated bilaterally.  SKIN: Moist and warm with no rashes appreciated.  Psych: Not anxious, depressed LN: No inguinal LN enlargement    Antibiotics   Anti-infectives    Start     Dose/Rate Route Frequency Ordered Stop   04/11/15 1700  cefUROXime (CEFTIN) tablet 500 mg     500 mg Oral 2 times daily with meals 04/11/15 1047     04/11/15 0130  oseltamivir (TAMIFLU) capsule 30 mg     30 mg Oral 2 times daily 04/11/15 0117 04/15/15 2159      Medications   Scheduled Meds: . amiodarone  400 mg Oral BID  . cefUROXime  500 mg Oral BID WC  . enoxaparin (LOVENOX) injection  40 mg Subcutaneous Q24H  . furosemide  20 mg Intravenous Q12H  . metoprolol tartrate  25 mg Oral BID  . oseltamivir  30 mg Oral BID  . potassium chloride  20 mEq Oral Daily  . sodium chloride flush  3 mL Intravenous Q12H   Continuous Infusions:  PRN Meds:.acetaminophen **OR** acetaminophen, ondansetron **OR** ondansetron (ZOFRAN) IV   Data Review:   Micro Results Recent Results (from the  past 240 hour(s))  MRSA PCR Screening     Status: None   Collection Time: 04/10/15  6:42 PM  Result Value Ref Range Status   MRSA by PCR NEGATIVE NEGATIVE Final    Comment:        The GeneXpert MRSA Assay (FDA approved for NASAL specimens only), is one component of a comprehensive MRSA colonization surveillance program. It is not intended to diagnose MRSA infection nor to guide or monitor treatment for MRSA infections.     Radiology Reports Dg Chest 1 View  04/10/2015  CLINICAL DATA:  Central chest pain, cough yesterday. EXAM: CHEST 1 VIEW COMPARISON:  None. FINDINGS: Mild cardiomegaly. Lungs are clear. No effusions. No acute bony abnormality. IMPRESSION: Mild cardiomegaly.  No active disease. Electronically Signed   By: Rolm Baptise M.D.   On: 04/10/2015 14:50     CBC  Recent Labs Lab 04/10/15 1356  WBC 9.9  HGB 16.7  HCT 49.6  PLT 176  MCV 86.7  MCH 29.2  MCHC 33.7  RDW 14.1  LYMPHSABS 0.8*  MONOABS 0.7  EOSABS 0.1  BASOSABS 0.0    Chemistries   Recent Labs Lab 04/10/15 1356  NA 136  K 3.7  CL 105  CO2 24  GLUCOSE 125*  BUN 16  CREATININE 1.20  CALCIUM 8.6*  MG 1.9  AST 24  ALT 22  ALKPHOS 73  BILITOT 0.6   ------------------------------------------------------------------------------------------------------------------ estimated creatinine clearance is 54.8 mL/min (by C-G formula based on Cr of 1.2). ------------------------------------------------------------------------------------------------------------------ No results for input(s): HGBA1C in the last 72 hours. ------------------------------------------------------------------------------------------------------------------ No results for input(s): CHOL, HDL, LDLCALC, TRIG, CHOLHDL, LDLDIRECT in the last 72 hours. ------------------------------------------------------------------------------------------------------------------  Recent Labs  04/10/15 1407  TSH 1.329    ------------------------------------------------------------------------------------------------------------------ No results for input(s): VITAMINB12, FOLATE, FERRITIN, TIBC, IRON, RETICCTPCT in the last 72 hours.  Coagulation profile  Recent Labs Lab 04/10/15 1356  INR 1.18    No results for input(s): DDIMER in  the last 72 hours.  Cardiac Enzymes  Recent Labs Lab 04/10/15 2032 04/11/15 0018 04/11/15 0335  TROPONINI 0.04* 0.05* 0.06*   ------------------------------------------------------------------------------------------------------------------ Invalid input(s): POCBNP    Assessment & Plan   82 year old male with past medical history of GERD, hiatal hernia, irritable bowel syndrome presents to the hospital due to palpitations, shortness of breath and noted to be in SVT.   #1 SVT- No further episodes appreciate cardiology input Continue amiodarone twice a day   #2 shortness of breath-etiology unclear possibly related to underlying SVT. We'll go ahead and start patient on Ceftin for acute bronchitis He has lower extremity swelling but chest x-ray is negative I will try some IV Lasix If shortness of breath persists we'll get a CT scan of the chest to further evaluate  #3 influenza continue Tamiflu  #4 miscellaneous Lovenox for DVT prophylax       Code Status Orders        Start     Ordered   04/10/15 1834  Full code   Continuous     04/10/15 1833    Code Status History    Date Active Date Inactive Code Status Order ID Comments User Context   This patient has a current code status but no historical code status.    Advance Directive Documentation        Most Recent Value   Type of Advance Directive  Living will   Pre-existing out of facility DNR order (yellow form or pink MOST form)     "MOST" Form in Place?             Consults Cardiology   DVT Prophylaxis  Lovenox   Lab Results  Component Value Date   PLT 176 04/10/2015      Time Spent in minutes   35 minutes  Greater than 50% of time spent in care coordination and counseling patient regarding the condition and plan of care.   Dustin Flock M.D on 04/11/2015 at 10:48 AM  Between 7am to 6pm - Pager - [phone]  After 6pm go to [email or web address] - password [email or web address] [place]   Office  [phone]

## 2015-04-11 NOTE — Plan of Care (Signed)
Problem: Safety: Goal: Ability to remain free from injury will improve Outcome: Progressing Fall precautions in place  Problem: Pain Managment: Goal: General experience of comfort will improve Outcome: Progressing Prn medications  Problem: Physical Regulation: Goal: Will remain free from infection Outcome: Progressing Po antibiotics  Problem: Tissue Perfusion: Goal: Risk factors for ineffective tissue perfusion will decrease Outcome: Progressing SQ Lovenox  Problem: Cardiac: Goal: Ability to achieve and maintain adequate cardiopulmonary perfusion will improve Outcome: Progressing Po amiodarone

## 2015-04-11 NOTE — Progress Notes (Addendum)
ANTICOAGULATION CONSULT NOTE - Initial Consult  Pharmacy Consult for Xarelto  Indication: pulmonary embolus  No Known Allergies  Patient Measurements: Height: 5\' 11"  (180.3 cm) Weight: 223 lb 3.2 oz (101.243 kg) IBW/kg (Calculated) : 75.3 Heparin Dosing Weight:   Vital Signs: Temp: 100 F (37.8 C) (03/30 1445) Temp Source: Oral (03/30 1445) BP: 111/55 mmHg (03/30 1445) Pulse Rate: 70 (03/30 1445)  Labs:  Recent Labs  04/10/15 1356 04/10/15 2032 04/11/15 0018 04/11/15 0335  HGB 16.7  --   --   --   HCT 49.6  --   --   --   PLT 176  --   --   --   LABPROT 15.2*  --   --   --   INR 1.18  --   --   --   CREATININE 1.20  --   --   --   TROPONINI <0.03 0.04* 0.05* 0.06*    Estimated Creatinine Clearance: 54.6 mL/min (by C-G formula based on Cr of 1.2).   Medical History: Past Medical History  Diagnosis Date  . IBS (irritable bowel syndrome)   . Colon polyps   . GERD (gastroesophageal reflux disease)   . Hiatal hernia   . Hemorrhoids   . Cancer ([place])     skin    Medications:  Prescriptions prior to admission  Medication Sig Dispense Refill Last Dose  . guaiFENesin (MUCINEX) 600 MG 12 hr tablet Take 600 mg by mouth 2 (two) times daily as needed.   04/09/2015 at Unknown time  . pseudoephedrine (SUDAFED) 30 MG tablet Take 30 mg by mouth every 4 (four) hours as needed for congestion.   04/09/2015 at Unknown time    Assessment: TBW = 101.2 kg ,  SrCr = 1.2  CrCl (TBW) = 64.42 ml/min  No prior anticoag noted  Goal of Therapy:  resolution of PE   Plan:  Xarelto 15 mg PO BID ordered to start 3/30 X 21 days followed by  Xarelto 20 mg PO Q Supper to start 4/21 @ 17:00.   Jordan Figueroa D 04/11/2015,4:04 PM

## 2015-04-11 NOTE — Progress Notes (Signed)
82 yo wm transferred from ICU 12 to room 244 via bed.  No distress on 2LO2 per [place].  Cardiac monitor placed on pt and verified.  Denies chest pain.  Lungs diminished with scattered wheezes bil.  Pt on droplet precautions for flu.  1+ edema lower legs bil.  SL lt ac/rt fa inplace.  Daughter at bedside.  Oriented to room and surroundings.  CB in reach, SR up x 2, bed alarm on.

## 2015-04-12 ENCOUNTER — Inpatient Hospital Stay: Payer: Medicare Other

## 2015-04-12 MED ORDER — PREDNISONE 20 MG PO TABS
40.0000 mg | ORAL_TABLET | Freq: Once | ORAL | Status: AC
  Administered 2015-04-12: 40 mg via ORAL
  Filled 2015-04-12: qty 2

## 2015-04-12 MED ORDER — GUAIFENESIN ER 600 MG PO TB12
600.0000 mg | ORAL_TABLET | Freq: Two times a day (BID) | ORAL | Status: DC
  Administered 2015-04-12 – 2015-04-13 (×3): 600 mg via ORAL
  Filled 2015-04-12 (×3): qty 1

## 2015-04-12 NOTE — Progress Notes (Signed)
ANTICOAGULATION CONSULT NOTE - Initial Consult  Pharmacy Consult for Xarelto  Indication: pulmonary embolus  No Known Allergies  Patient Measurements: Height: 5\' 11"  (180.3 cm) Weight: 223 lb 3.2 oz (101.243 kg) IBW/kg (Calculated) : 75.3 Heparin Dosing Weight:   Vital Signs: Temp: 99.2 F (37.3 C) (03/31 0745) Temp Source: Oral (03/31 0745) BP: 122/67 mmHg (03/31 0745) Pulse Rate: 74 (03/31 0745)  Labs:  Recent Labs  04/10/15 1356 04/10/15 2032 04/11/15 0018 04/11/15 0335  HGB 16.7  --   --   --   HCT 49.6  --   --   --   PLT 176  --   --   --   LABPROT 15.2*  --   --   --   INR 1.18  --   --   --   CREATININE 1.20  --   --   --   TROPONINI <0.03 0.04* 0.05* 0.06*    Estimated Creatinine Clearance: 54.6 mL/min (by C-G formula based on Cr of 1.2).   Medical History: Past Medical History  Diagnosis Date  . IBS (irritable bowel syndrome)   . Colon polyps   . GERD (gastroesophageal reflux disease)   . Hiatal hernia   . Hemorrhoids   . Cancer [facility])     skin    Assessment: Pharmacy consulted to dose rivaroxaban for newly diagnosed PE. Patient was not taking anticoagulants prior to admission. Patient was started on rivaroxaban yesterday.  CrCl: 54 mL/min  Goal of Therapy:  resolution of PE   Plan:  Continue current regimen of rivaroxaban 15 mg PO BID x 21 days followed by 20 mg daily with supper to start on 4/21.   Lenis Noon, PharmD Clinical Pharmacist 04/12/2015,9:43 AM

## 2015-04-12 NOTE — Plan of Care (Signed)
Problem: Safety: Goal: Ability to remain free from injury will improve Outcome: Progressing Fall precautions in place  Problem: Pain Managment: Goal: General experience of comfort will improve Outcome: Progressing Prn medications  Problem: Physical Regulation: Goal: Ability to maintain clinical measurements within normal limits will improve Outcome: Progressing PT eval today Goal: Will remain free from infection Outcome: Progressing antibiotics  Problem: Tissue Perfusion: Goal: Risk factors for ineffective tissue perfusion will decrease Outcome: Progressing PO Xarelto  Problem: Activity: Goal: Risk for activity intolerance will decrease Outcome: Progressing PT evaluation  Problem: Cardiac: Goal: Ability to achieve and maintain adequate cardiopulmonary perfusion will improve Outcome: Progressing PO amiodarone and xarelto

## 2015-04-12 NOTE — Care Management Note (Signed)
Case Management Note  Patient Details  Name: Jordan Figueroa MRN: [id number] Date of Birth: [birth year]  Subjective/Objective:  Met with patient at bedside. He lives at home. PCP is Dr. Lovie Macadamia.  He states he is independent, active and drives. He has been less mobile lately due to foot pain. Denies the use of any DME. Positive for a PE. Now on Xarelto. Coupon given for free 30 day trial. Venous Leg Doppler pending to R/O DVT. PT eval pending.  Patient denies the need for any home health services but is willing to work with PT and go from there.                  Action/Plan:   Expected Discharge Date:  04/15/15               Expected Discharge Plan:  Home/Self Care  In-House Referral:     Discharge planning Services  CM Consult  Post Acute Care Choice:    Choice offered to:     DME Arranged:    DME Agency:     HH Arranged:    HH Agency:     Status of Service:  Completed, signed off  Medicare Important Message Given:    Date Medicare IM Given:    Medicare IM give by:    Date Additional Medicare IM Given:    Additional Medicare Important Message give by:     If discussed at [place] of Stay Meetings, dates discussed:    Additional Comments:  Jolly Mango, RN 04/12/2015, 2:39 PM

## 2015-04-12 NOTE — Progress Notes (Signed)
[name] [facility]                                                                                                                                                                                            Patient Demographics   Jordan Figueroa, is a 82 y.o. male, DOB - 12-Oct-1927, [id number]  Admit date - 04/10/2015   Admitting Physician Henreitta Leber, MD  Outpatient Primary MD for the patient is Jordan Lovie Macadamia, MD   LOS - 2  Subjective: No further SVT noted patient had a CT per PE which shows pulmonary embolism. Patient does report that he had some issues with this foot and what had a sedentary lifestyle recently. Still feels short of breath and has a cough.   Review of Systems:   CONSTITUTIONAL: No documented fever. No fatigue, weakness. No weight gain, no weight loss.  EYES: No blurry or double vision.  ENT: No tinnitus. No postnasal drip. No redness of the oropharynx.  RESPIRATORY: Positive cough, no wheeze, no hemoptysis. Positive dyspnea.  CARDIOVASCULAR: No chest pain. No orthopnea. No palpitations. No syncope.  GASTROINTESTINAL: No nausea, no vomiting or diarrhea. No abdominal pain. No melena or hematochezia.  GENITOURINARY: No dysuria or hematuria.  ENDOCRINE: No polyuria or nocturia. No heat or cold intolerance.  HEMATOLOGY: No anemia. No bruising. No bleeding.  INTEGUMENTARY: No rashes. No lesions.  MUSCULOSKELETAL: No arthritis. No swelling. No gout.  NEUROLOGIC: No numbness, tingling, or ataxia. No seizure-type activity.  PSYCHIATRIC: No anxiety. No insomnia. No ADD.    Vitals:   Filed Vitals:   04/11/15 2024 04/11/15 2357 04/12/15 0540 04/12/15 0745  BP: 109/52 114/63 132/64 122/67  Pulse: 68 68 79 74  Temp: 99.9 F (37.7 C) 98.9 F (37.2 C) 99 F (37.2 C) 99.2 F (37.3 C)  TempSrc: Oral Oral Oral Oral  Resp:  20 20 17   Height:      Weight:      SpO2: 94% 96% 95% 95%    Wt Readings from Last 3 Encounters:   04/11/15 101.243 kg (223 lb 3.2 oz)  08/22/13 93.441 kg (206 lb)  08/02/13 94.348 kg (208 lb)     Intake/Output Summary (Last 24 hours) at 04/12/15 0919 Last data filed at 04/12/15 0540  Gross per 24 hour  Intake    243 ml  Output   2370 ml  Net  -2127 ml    Physical Exam:   GENERAL: Pleasant-appearing in no apparent distress.  HEAD, EYES, EARS, NOSE AND THROAT: Atraumatic, normocephalic. Extraocular muscles are intact. Pupils equal and reactive to light. Sclerae anicteric. No conjunctival  injection. No oro-pharyngeal erythema.  NECK: Supple. There is no jugular venous distention. No bruits, no lymphadenopathy, no thyromegaly.  HEART: Regular rate and rhythm,. No murmurs, no rubs, no clicks.  LUNGS: Clear to auscultation bilaterally. No rales or rhonchi. No wheezes.  ABDOMEN: Soft, flat, nontender, nondistended. Has good bowel sounds. No hepatosplenomegaly appreciated.  EXTREMITIES: No evidence of any cyanosis, clubbing, or 2+ peripheral edema.  +2 pedal and radial pulses bilaterally.  NEUROLOGIC: The patient is alert, awake, and oriented x3 with no focal motor or sensory deficits appreciated bilaterally.  SKIN: Moist and warm with no rashes appreciated.  Psych: Not anxious, depressed LN: No inguinal LN enlargement    Antibiotics   Anti-infectives    Start     Dose/Rate Route Frequency Ordered Stop   04/11/15 1200  cefUROXime (CEFTIN) tablet 500 mg     500 mg Oral 2 times daily with meals 04/11/15 1047     04/11/15 0130  oseltamivir (TAMIFLU) capsule 30 mg     30 mg Oral 2 times daily 04/11/15 0117 04/15/15 2159      Medications   Scheduled Meds: . amiodarone  400 mg Oral BID  . cefUROXime  500 mg Oral BID WC  . furosemide  20 mg Intravenous Q12H  . guaiFENesin  600 mg Oral BID  . metoprolol tartrate  25 mg Oral BID  . oseltamivir  30 mg Oral BID  . potassium chloride  20 mEq Oral Daily  . predniSONE  40 mg Oral Once  . Rivaroxaban  15 mg Oral BID WC  . [START ON  05/03/2015] rivaroxaban  20 mg Oral Q supper  . sodium chloride flush  3 mL Intravenous Q12H   Continuous Infusions:  PRN Meds:.acetaminophen **OR** acetaminophen, guaiFENesin, ibuprofen, ondansetron **OR** ondansetron (ZOFRAN) IV   Data Review:   Micro Results Recent Results (from the past 240 hour(s))  MRSA PCR Screening     Status: None   Collection Time: 04/10/15  6:42 PM  Result Value Ref Range Status   MRSA by PCR NEGATIVE NEGATIVE Final    Comment:        The GeneXpert MRSA Assay (FDA approved for NASAL specimens only), is one component of a comprehensive MRSA colonization surveillance program. It is not intended to diagnose MRSA infection nor to guide or monitor treatment for MRSA infections.     Radiology Reports Dg Chest 1 View  04/10/2015  CLINICAL DATA:  Central chest pain, cough yesterday. EXAM: CHEST 1 VIEW COMPARISON:  None. FINDINGS: Mild cardiomegaly. Lungs are clear. No effusions. No acute bony abnormality. IMPRESSION: Mild cardiomegaly.  No active disease. Electronically Signed   By: Rolm Baptise M.D.   On: 04/10/2015 14:50   Ct Angio Chest Pe W/cm &/or Wo Cm  04/11/2015  CLINICAL DATA:  Shortness of Breath EXAM: CT ANGIOGRAPHY CHEST WITH CONTRAST TECHNIQUE: Multidetector CT imaging of the chest was performed using the standard protocol during bolus administration of intravenous contrast. Multiplanar CT image reconstructions and MIPs were obtained to evaluate the vascular anatomy. CONTRAST:  100 mL Isovue 370. COMPARISON:  04/10/2015 FINDINGS: Lungs are well aerated bilaterally. A tiny calcified nodule is noted in the left apex on image number 20 of series 6 consistent with a small calcified granuloma. No other nodular changes are seen. Very minimal atelectatic changes are noted in the right upper lobe along the major fissure. No sizable effusion is noted. The thoracic inlet is within normal limits. The thoracic aorta is unremarkable. The pulmonary artery shows  a  normal branching pattern. A tiny filling defect is noted in the right main pulmonary artery extending into right middle lobe pulmonary arterial branches consistent with a small pulmonary embolus. No significant hilar or mediastinal adenopathy is noted. Some calcified lymph nodes are noted within the right hilum consistent with prior granulomatous disease. Very mild bronchial thickening is noted which may be related to a degree of bronchitis. Mild coronary calcifications are seen. The visualized upper abdomen is within normal limits. The osseous structures show no acute abnormality. Degenerative changes of the thoracic spine are seen. Review of the MIP images confirms the above findings. IMPRESSION: Mild bronchial thickening which may be related to a degree of bronchitis. Tiny filling defect within the right middle lobe arterial branches consistent with single pulmonary embolus. No other definitive embolus is seen. Electronically Signed   By: Inez Catalina M.D.   On: 04/11/2015 14:36     CBC  Recent Labs Lab 04/10/15 1356  WBC 9.9  HGB 16.7  HCT 49.6  PLT 176  MCV 86.7  MCH 29.2  MCHC 33.7  RDW 14.1  LYMPHSABS 0.8*  MONOABS 0.7  EOSABS 0.1  BASOSABS 0.0    Chemistries   Recent Labs Lab 04/10/15 1356  NA 136  K 3.7  CL 105  CO2 24  GLUCOSE 125*  BUN 16  CREATININE 1.20  CALCIUM 8.6*  MG 1.9  AST 24  ALT 22  ALKPHOS 73  BILITOT 0.6   ------------------------------------------------------------------------------------------------------------------ estimated creatinine clearance is 54.6 mL/min (by C-G formula based on Cr of 1.2). ------------------------------------------------------------------------------------------------------------------ No results for input(s): HGBA1C in the last 72 hours. ------------------------------------------------------------------------------------------------------------------ No results for input(s): CHOL, HDL, LDLCALC, TRIG, CHOLHDL, LDLDIRECT  in the last 72 hours. ------------------------------------------------------------------------------------------------------------------  Recent Labs  04/10/15 1407  TSH 1.329   ------------------------------------------------------------------------------------------------------------------ No results for input(s): VITAMINB12, FOLATE, FERRITIN, TIBC, IRON, RETICCTPCT in the last 72 hours.  Coagulation profile  Recent Labs Lab 04/10/15 1356  INR 1.18    No results for input(s): DDIMER in the last 72 hours.  Cardiac Enzymes  Recent Labs Lab 04/10/15 2032 04/11/15 0018 04/11/15 0335  TROPONINI 0.04* 0.05* 0.06*   ------------------------------------------------------------------------------------------------------------------ Invalid input(s): POCBNP    Assessment & Plan   82 year old male with past medical history of GERD, hiatal hernia, irritable bowel syndrome presents to the hospital due to palpitations, shortness of breath and noted to be in SVT.   #1 SVT- No further episodes appreciate cardiology input Continue amiodarone twice a day   #2 shortness of breath- Due to acute bronchitis as well as an acute pulmonary embolism We'll try to wean his oxygen Continue Ceftin  #3 influenza continue Tamiflu  #4 Pulmonary embolism continue Xarelto, I would check Doppler of his lower extremity  #5 DVT prophylaxis with Xarelto     Code Status Orders        Start     Ordered   04/10/15 1834  Full code   Continuous     04/10/15 1833    Code Status History    Date Active Date Inactive Code Status Order ID Comments User Context   This patient has a current code status but no historical code status.    Advance Directive Documentation        Most Recent Value   Type of Advance Directive  Living will   Pre-existing out of facility DNR order (yellow form or pink MOST form)     "MOST" Form in Place?  Consults Cardiology   DVT Prophylaxis   Lovenox   Lab Results  Component Value Date   PLT 176 04/10/2015     Time Spent in minutes   35 minutes  Greater than 50% of time spent in care coordination and counseling patient regarding the condition and plan of care.   Dustin Flock M.D on 04/12/2015 at 9:19 AM  Between 7am to 6pm - Pager - [phone]  After 6pm go to [email or web address] - password [email or web address] [facility]   Office  [phone]

## 2015-04-12 NOTE — Progress Notes (Signed)
PT Cancellation Note  Patient Details Name: Jordan Figueroa MRN: [id number] DOB: 1927/02/08   Cancelled Treatment:    Reason Eval/Treat Not Completed: Medical issues which prohibited therapy Pt with PE, startedxorelto yesterday, will hold PT until tomorrow.  Kreg Shropshire 04/12/2015, 11:59 AM

## 2015-04-12 NOTE — Progress Notes (Signed)
Hospital Problem List     Principal Problem:   Influenza Active Problems:   Paroxysmal SVT (supraventricular tachycardia) (HCC)   Dizziness    Patient Profile:   Primary Cardiologist: New to [facility] - Dr. Ellyn Hack  82 y.o. male w/ PMH of chronic SOB, obesity, melanoma, and hiatal hernia who presented to [facility] on 04/10/2015 with a 6 week history of persistent SOB and a 1 day history of lower extremity swelling after visiting his PCP. Found to be in new onset SVT with heart rates into the 160s. Subsequently diagnosed with influenza.  Subjective   Reports improvement in his breathing. Reports a mild sternal chest discomfort with coughing.  Inpatient Medications    . amiodarone  400 mg Oral BID  . cefUROXime  500 mg Oral BID WC  . furosemide  20 mg Intravenous Q12H  . guaiFENesin  600 mg Oral BID  . metoprolol tartrate  25 mg Oral BID  . oseltamivir  30 mg Oral BID  . potassium chloride  20 mEq Oral Daily  . Rivaroxaban  15 mg Oral BID WC  . [START ON 05/03/2015] rivaroxaban  20 mg Oral Q supper  . sodium chloride flush  3 mL Intravenous Q12H    Vital Signs    Filed Vitals:   04/11/15 2024 04/11/15 2357 04/12/15 0540 04/12/15 0745  BP: 109/52 114/63 132/64 122/67  Pulse: 68 68 79 74  Temp: 99.9 F (37.7 C) 98.9 F (37.2 C) 99 F (37.2 C) 99.2 F (37.3 C)  TempSrc: Oral Oral Oral Oral  Resp:  20 20 17   Height:      Weight:      SpO2: 94% 96% 95% 95%    Intake/Output Summary (Last 24 hours) at 04/12/15 1150 Last data filed at 04/12/15 0946  Gross per 24 hour  Intake    243 ml  Output   1620 ml  Net  -1377 ml   Filed Weights   04/10/15 1800 04/11/15 1445  Weight: 225 lb 8.5 oz (102.3 kg) 223 lb 3.2 oz (101.243 kg)    Physical Exam    General: Well developed, well nourished, male appearing in no acute distress. Head: Normocephalic, atraumatic.  Neck: Supple without bruits, JVD not elevated. Lungs:  Resp regular and unlabored, CTA without wheezing or  rales. Heart: RRR, S1, S2, no S3, S4, or murmur; no rub. Abdomen: Soft, non-tender, non-distended with normoactive bowel sounds. No hepatomegaly. No rebound/guarding. No obvious abdominal masses. Extremities: No clubbing, cyanosis, or edema. Distal pedal pulses are 2+ bilaterally. Neuro: Alert and oriented X 3. Moves all extremities spontaneously. Psych: Normal affect.  Labs    CBC  Recent Labs  04/10/15 1356  WBC 9.9  NEUTROABS 8.3*  HGB 16.7  HCT 49.6  MCV 86.7  PLT 0000000   Basic Metabolic Panel  Recent Labs  04/10/15 1356  NA 136  K 3.7  CL 105  CO2 24  GLUCOSE 125*  BUN 16  CREATININE 1.20  CALCIUM 8.6*  MG 1.9   Liver Function Tests  Recent Labs  04/10/15 1356  AST 24  ALT 22  ALKPHOS 73  BILITOT 0.6  PROT 7.2  ALBUMIN 4.1   No results for input(s): LIPASE, AMYLASE in the last 72 hours. Cardiac Enzymes  Recent Labs  04/10/15 2032 04/11/15 0018 04/11/15 0335  TROPONINI 0.04* 0.05* 0.06*   BNP Invalid input(s): POCBNP D-Dimer No results for input(s): DDIMER in the last 72 hours. Hemoglobin A1C No results for input(s): HGBA1C  in the last 72 hours. Fasting Lipid Panel No results for input(s): CHOL, HDL, LDLCALC, TRIG, CHOLHDL, LDLDIRECT in the last 72 hours. Thyroid Function Tests  Recent Labs  04/10/15 1407  TSH 1.329    Telemetry    NSR, HR in 60's - 70's. Multiform PVC's noted.  ECG    No new tracings.   Cardiac Studies and Radiology    Dg Chest 1 View: 04/10/2015  CLINICAL DATA:  Central chest pain, cough yesterday. EXAM: CHEST 1 VIEW COMPARISON:  None. FINDINGS: Mild cardiomegaly. Lungs are clear. No effusions. No acute bony abnormality. IMPRESSION: Mild cardiomegaly.  No active disease. Electronically Signed   By: Rolm Baptise M.D.   On: 04/10/2015 14:50   Ct Angio Chest Pe W/cm &/or Wo Cm: 04/11/2015  CLINICAL DATA:  Shortness of Breath EXAM: CT ANGIOGRAPHY CHEST WITH CONTRAST TECHNIQUE: Multidetector CT imaging of the chest  was performed using the standard protocol during bolus administration of intravenous contrast. Multiplanar CT image reconstructions and MIPs were obtained to evaluate the vascular anatomy. CONTRAST:  100 mL Isovue 370. COMPARISON:  04/10/2015 FINDINGS: Lungs are well aerated bilaterally. A tiny calcified nodule is noted in the left apex on image number 20 of series 6 consistent with a small calcified granuloma. No other nodular changes are seen. Very minimal atelectatic changes are noted in the right upper lobe along the major fissure. No sizable effusion is noted. The thoracic inlet is within normal limits. The thoracic aorta is unremarkable. The pulmonary artery shows a normal branching pattern. A tiny filling defect is noted in the right main pulmonary artery extending into right middle lobe pulmonary arterial branches consistent with a small pulmonary embolus. No significant hilar or mediastinal adenopathy is noted. Some calcified lymph nodes are noted within the right hilum consistent with prior granulomatous disease. Very mild bronchial thickening is noted which may be related to a degree of bronchitis. Mild coronary calcifications are seen. The visualized upper abdomen is within normal limits. The osseous structures show no acute abnormality. Degenerative changes of the thoracic spine are seen. Review of the MIP images confirms the above findings. IMPRESSION: Mild bronchial thickening which may be related to a degree of bronchitis. Tiny filling defect within the right middle lobe arterial branches consistent with single pulmonary embolus. No other definitive embolus is seen. Electronically Signed   By: Inez Catalina M.D.   On: 04/11/2015 14:36    Echocardiogram: 04/10/2015 Study Conclusions  - Left ventricle: The cavity size was normal. Systolic function was  normal. The estimated ejection fraction was in the range of 60%  to 65%. Wall motion was normal; there were no regional wall  motion  abnormalities. Doppler parameters are consistent with  abnormal left ventricular relaxation (grade 1 diastolic  dysfunction). - Mitral valve: There was mild regurgitation. - Left atrium: The atrium was normal in size. - Right ventricle: Systolic function was normal. - Pulmonary arteries: Systolic pressure was mildly elevated. PA  peak pressure: 38 mm Hg (S).  Assessment & Plan    1. New onset SVT - Converted to sinus rhythm with heart rate in the 70's at 6:09 PM on 04/10/2015 after receiving an Amiodarone bolus and has remained in sinus rhythm since. Telemetry shows frequent PVC's but no recurrence of SVT. - Continue PO Amiodarone 400 mg BID for now while he is dealing with influenza A as he is at high risk of recurrent arrhythmia. Would wean once his flu symptoms have improved. - Continue Lopressor 25 mg BID.  2. Influenza A - started on Tamiflu  - per admitting team  3. SOB/ Newly Diagnosed Pulmonary Embolism - Negative BNP woth echo showing EF 60-65%, no RWMA, GR1DD, mild MR, left atrium normal in size, RV systolic function normal, PASP 38 mm Hg - CT Scan showed a filling defect within the right middle lobe arterial branches consistent with single pulmonary embolus. Lower extremity dopplers are pending. Started on Xarelto.  - per admitting team  4. Elevated troponin - cyclic troponin values were 0.04, 0.05, and 0.06, likely supply-demand ischemia in the setting of the patient's SVT and influenza A. - denies any recent anginal symptoms. Echocardiogram without wall motion abnormalities. - no further ischemic evaluation indicated at this time.  5. Hypokalemia - 3.7 on 04/10/2015. - repeat BMET is pending.   Signed, Erma Heritage , PA-C 11:50 AM 04/12/2015 Pager: [phone]

## 2015-04-13 LAB — BASIC METABOLIC PANEL
Anion gap: 5 (ref 5–15)
BUN: 16 mg/dL (ref 6–20)
CALCIUM: 8 mg/dL — AB (ref 8.9–10.3)
CHLORIDE: 103 mmol/L (ref 101–111)
CO2: 29 mmol/L (ref 22–32)
CREATININE: 0.89 mg/dL (ref 0.61–1.24)
GFR calc non Af Amer: >60 mL/min (ref >60)
Glucose, Bld: 113 mg/dL — ABNORMAL HIGH (ref 65–99)
Potassium: 3.5 mmol/L (ref 3.5–5.1)
SODIUM: 137 mmol/L (ref 135–145)

## 2015-04-13 LAB — CBC
HCT: 44.5 % (ref 40.0–52.0)
HEMOGLOBIN: 15.4 g/dL (ref 13.0–18.0)
MCH: 29.6 pg (ref 26.0–34.0)
MCHC: 34.6 g/dL (ref 32.0–36.0)
MCV: 85.5 fL (ref 80.0–100.0)
Platelets: 150 10*3/uL (ref 150–440)
RBC: 5.21 MIL/uL (ref 4.40–5.90)
RDW: 13.6 % (ref 11.5–14.5)
WBC: 7.2 10*3/uL (ref 3.8–10.6)

## 2015-04-13 MED ORDER — RIVAROXABAN 15 MG PO TABS: ORAL_TABLET | ORAL | Status: DC

## 2015-04-13 MED ORDER — METOPROLOL TARTRATE 25 MG PO TABS: 25.0000 mg | ORAL_TABLET | Freq: Two times a day (BID) | ORAL | Status: DC

## 2015-04-13 MED ORDER — OSELTAMIVIR PHOSPHATE 30 MG PO CAPS: 30.0000 mg | ORAL_CAPSULE | Freq: Two times a day (BID) | ORAL | Status: DC

## 2015-04-13 MED ORDER — RIVAROXABAN 20 MG PO TABS: ORAL_TABLET | ORAL | Status: DC

## 2015-04-13 NOTE — Progress Notes (Signed)
MD aware PO metoprolol held d/t HR<65. MD will assess and see if PO amio needs to be given this AM as well.

## 2015-04-13 NOTE — Care Management Note (Signed)
Case Management Note  Patient Details  Name: Jordan Figueroa MRN: [id number] Date of Birth: 12/12/1927  Subjective/Objective:         Jordan Figueroa insurance provider would not authorize Chief Strategy Officer. Dr Leslye Peer discontinued [name] and prescribed Eliquis 10mg  BID x 1 week, the 5 mg for the rest of the month. This Probation officer provided Eliquis coupon to Manufacturing engineer on [facility]. Pharmacist there ran the Eliquis coupon through the computer and it was approved by Jordan Figueroa provider.            Action/Plan:   Expected Discharge Date:  04/15/15               Expected Discharge Plan:  Home/Self Care  In-House Referral:     Discharge planning Services  CM Consult  Post Acute Care Choice:    Choice offered to:     DME Arranged:    DME Agency:     HH Arranged:    HH Agency:     Status of Service:  Completed, signed off  Medicare Important Message Given:    Date Medicare IM Given:    Medicare IM give by:    Date Additional Medicare IM Given:    Additional Medicare Important Message give by:     If discussed at [place] of Stay Meetings, dates discussed:    Additional Comments:  [name] A, RN 04/13/2015, 1:17 PM

## 2015-04-13 NOTE — Progress Notes (Signed)
Pt. Discharged to home via wc. Discharge instructions and medication regimen reviewed at bedside with patient and daughter. Both verbalize 3understanding of instructions and medication regimen. Prescriptions included with d/c papers. Patient assessment unchanged from this morning. TELE and IV discontinued per policy.

## 2015-04-13 NOTE — Evaluation (Signed)
Physical Therapy Evaluation Patient Details Name: Jordan Figueroa MRN: [id number] DOB: Nov 17, 1927 Today's Date: 04/13/2015   History of Present Illness  Pt here with the flu, he is feeling better and is eager to go home.    Clinical Impression  Pt did well with PT exam, he showed no safety issues or other concerns.  His O2 remained in the high 90s t/o the effort, he was confident with ambulation and reports he feels ready to go home.  Pt does not need further PT intervention.     Follow Up Recommendations No PT follow up    Equipment Recommendations       Recommendations for Other Services       Precautions / Restrictions Precautions Precautions: Fall Restrictions Weight Bearing Restrictions: No      Mobility  Bed Mobility               General bed mobility comments: Pt in recliner on arrival.  Transfers Overall transfer level: Independent               General transfer comment: Pt able to rise to standing w/o issue, no AD and no safety concerns.    Ambulation/Gait   Ambulation Distance (Feet): 250 Feet Assistive device: None       General Gait Details: Pt walks with good confidence, speed and safety. He has no issues, does not fatigue with the effort and ultimately is near his baseline.   Stairs Stairs: Yes Stairs assistance: Independent Stair Management: One rail Right Number of Stairs: 6    Wheelchair Mobility    Modified Rankin (Stroke Patients Only)       Balance Overall balance assessment: Independent                                           Pertinent Vitals/Pain Pain Assessment: No/denies pain    Home Living Family/patient expects to be discharged to:: Private residence Living Arrangements: Children Available Help at Discharge: Family Type of Home: House Home Access: Stairs to enter   Technical brewer of Steps: 1          Prior Function Level of Independence: Independent         Comments: Pt  gets out of the house multiple times a week and generally is very active.      Hand Dominance        Extremity/Trunk Assessment   Upper Extremity Assessment: Overall WFL for tasks assessed           Lower Extremity Assessment: Overall WFL for tasks assessed         Communication   Communication: No difficulties  Cognition Arousal/Alertness: Awake/alert Behavior During Therapy: WFL for tasks assessed/performed Overall Cognitive Status: Within Functional Limits for tasks assessed                      General Comments      Exercises        Assessment/Plan    PT Assessment Patent does not need any further PT services  PT Diagnosis Generalized weakness   PT Problem List    PT Treatment Interventions     PT Goals (Current goals can be found in the Care Plan section) Acute Rehab PT Goals Patient Stated Goal: Go home    Frequency     Barriers to discharge  Co-evaluation               End of Session Equipment Utilized During Treatment: Gait belt Activity Tolerance: Patient tolerated treatment well Patient left: with call bell/phone within reach;with chair alarm set           Time: [phone] PT Time Calculation (min) (ACUTE ONLY): 14 min   Charges:   PT Evaluation $PT Eval Low Complexity: 1 Procedure     PT G Codes:       Wayne Both, PT, DPT [phone]  Kreg Shropshire 04/13/2015, 11:28 AM

## 2015-04-13 NOTE — Discharge Instructions (Signed)
Rivaroxaban oral tablets What is this medicine? RIVAROXABAN (ri va ROX a ban) is an anticoagulant (blood thinner). It is used to treat blood clots in the lungs or in the veins. It is also used after knee or hip surgeries to prevent blood clots. It is also used to lower the chance of stroke in people with a medical condition called atrial fibrillation. This medicine may be used for other purposes; ask your health care provider or pharmacist if you have questions. What should I tell my health care provider before I take this medicine? They need to know if you have any of these conditions: -bleeding disorders -bleeding in the brain -blood in your stools (black or tarry stools) or if you have blood in your vomit -history of stomach bleeding -kidney disease -liver disease -low blood counts, like low white cell, platelet, or red cell counts -recent or planned spinal or epidural procedure -take medicines that treat or prevent blood clots -an unusual or allergic reaction to rivaroxaban, other medicines, foods, dyes, or preservatives -pregnant or trying to get pregnant -breast-feeding How should I use this medicine? Take this medicine by mouth with a glass of water. Follow the directions on the prescription label. Take your medicine at regular intervals. Do not take it more often than directed. Do not stop taking except on your doctor's advice. Stopping this medicine may increase your risk of a blood clot. Be sure to refill your prescription before you run out of medicine. If you are taking this medicine after hip or knee replacement surgery, take it with or without food. If you are taking this medicine for atrial fibrillation, take it with your evening meal. If you are taking this medicine to treat blood clots, take it with food at the same time each day. If you are unable to swallow your tablet, you may crush the tablet and mix it in applesauce. Then, immediately eat the applesauce. You should eat more  food right after you eat the applesauce containing the crushed tablet. Talk to your pediatrician regarding the use of this medicine in children. Special care may be needed. Overdosage: If you think you have taken too much of this medicine contact a poison control center or emergency room at once. NOTE: This medicine is only for you. Do not share this medicine with others. What if I miss a dose? If you take your medicine once a day and miss a dose, take the missed dose as soon as you remember. If you take your medicine twice a day and miss a dose, take the missed dose immediately. In this instance, 2 tablets may be taken at the same time. The next day you should take 1 tablet twice a day as directed. What may interact with this medicine? -aspirin and aspirin-like medicines -certain antibiotics like erythromycin, azithromycin, and clarithromycin -certain medicines for fungal infections like ketoconazole and itraconazole -certain medicines for irregular heart beat like amiodarone, quinidine, dronedarone -certain medicines for seizures like carbamazepine, phenytoin -certain medicines that treat or prevent blood clots like warfarin, enoxaparin, and dalteparin -conivaptan -diltiazem -felodipine -indinavir -lopinavir; ritonavir -NSAIDS, medicines for pain and inflammation, like ibuprofen or naproxen -ranolazine -rifampin -ritonavir -St. John's wort -verapamil This list may not describe all possible interactions. Give your health care provider a list of all the medicines, herbs, non-prescription drugs, or dietary supplements you use. Also tell them if you smoke, drink alcohol, or use illegal drugs. Some items may interact with your medicine. What should I watch for while using this   medicine? Visit your doctor or health care professional for regular checks on your progress. Your condition will be monitored carefully while you are receiving this medicine. Notify your doctor or health care  professional and seek emergency treatment if you develop breathing problems; changes in vision; chest pain; severe, sudden headache; pain, swelling, warmth in the leg; trouble speaking; sudden numbness or weakness of the face, arm, or leg. These can be signs that your condition has gotten worse. If you are going to have surgery, tell your doctor or health care professional that you are taking this medicine. Tell your health care professional that you use this medicine before you have a spinal or epidural procedure. Sometimes people who take this medicine have bleeding problems around the spine when they have a spinal or epidural procedure. This bleeding is very rare. If you have a spinal or epidural procedure while on this medicine, call your health care professional immediately if you have back pain, numbness or tingling (especially in your legs and feet), muscle weakness, paralysis, or loss of bladder or bowel control. Avoid sports and activities that might cause injury while you are using this medicine. Severe falls or injuries can cause unseen bleeding. Be careful when using sharp tools or knives. Consider using an Copy. Take special care brushing or flossing your teeth. Report any injuries, bruising, or red spots on the skin to your doctor or health care professional. What side effects may I notice from receiving this medicine? Side effects that you should report to your doctor or health care professional as soon as possible: -allergic reactions like skin rash, itching or hives, swelling of the face, lips, or tongue -back pain -redness, blistering, peeling or loosening of the skin, including inside the mouth -signs and symptoms of bleeding such as bloody or black, tarry stools; red or dark-brown urine; spitting up blood or brown material that looks like coffee grounds; red spots on the skin; unusual bruising or bleeding from the eye, gums, or nose Side effects that usually do not require  medical attention (Report these to your doctor or health care professional if they continue or are bothersome.): -dizziness -muscle pain This list may not describe all possible side effects. Call your doctor for medical advice about side effects. You may report side effects to FDA at 1-800-FDA-1088. Where should I keep my medicine? Keep out of the reach of children. Store at room temperature between 15 and 30 degrees C (59 and 86 degrees F). Throw away any unused medicine after the expiration date. NOTE: This sheet is a summary. It may not cover all possible information. If you have questions about this medicine, talk to your doctor, pharmacist, or health care provider.    2016, Elsevier/Gold Standard. (2013-12-27 12:45:34)  Oseltamivir capsules What is this medicine? OSELTAMIVIR (os el TAM i vir) is an antiviral medicine. It is used to prevent and to treat some kinds of influenza or the flu. It will not work for colds or other viral infections. This medicine may be used for other purposes; ask your health care provider or pharmacist if you have questions. What should I tell my health care provider before I take this medicine? They need to know if you have any of the following conditions: -heart disease -immune system problems -kidney disease -liver disease -lung disease -an unusual or allergic reaction to oseltamivir, other medicines, foods, dyes, or preservatives -pregnant or trying to get pregnant -breast-feeding How should I use this medicine? Take this medicine by mouth with  a glass of water. Follow the directions on the prescription label. Start this medicine at the first sign of flu symptoms. You can take it with or without food. If it upsets your stomach, take it with food. Take your medicine at regular intervals. Do not take your medicine more often than directed. Take all of your medicine as directed even if you think you are better. Do not skip doses or stop your medicine  early. Talk to your pediatrician regarding the use of this medicine in children. While this drug may be prescribed for children as young as 14 days for selected conditions, precautions do apply. Overdosage: If you think you have taken too much of this medicine contact a poison control center or emergency room at once. NOTE: This medicine is only for you. Do not share this medicine with others. What if I miss a dose? If you miss a dose, take it as soon as you remember. If it is almost time for your next dose (within 2 hours), take only that dose. Do not take double or extra doses. What may interact with this medicine? Interactions are not expected. This list may not describe all possible interactions. Give your health care provider a list of all the medicines, herbs, non-prescription drugs, or dietary supplements you use. Also tell them if you smoke, drink alcohol, or use illegal drugs. Some items may interact with your medicine. What should I watch for while using this medicine? Visit your doctor or health care professional for regular check ups. Tell your doctor if your symptoms do not start to get better or if they get worse. If you have the flu, you may be at an increased risk of developing seizures, confusion, or abnormal behavior. This occurs early in the illness, and more frequently in children and teens. These events are not common, but may result in accidental injury to the patient. Families and caregivers of patients should watch for signs of unusual behavior and contact a doctor or health care professional right away if the patient shows signs of unusual behavior. This medicine is not a substitute for the flu shot. Talk to your doctor each year about an annual flu shot. What side effects may I notice from receiving this medicine? Side effects that you should report to your doctor or health care professional as soon as possible: -allergic reactions like skin rash, itching or hives, swelling  of the face, lips, or tongue -anxiety, confusion, unusual behavior -breathing problems -hallucination, loss of contact with reality -redness, blistering, peeling or loosening of the skin, including inside the mouth -seizures Side effects that usually do not require medical attention (report to your doctor or health care professional if they continue or are bothersome): -diarrhea -headache -nausea, vomiting -pain This list may not describe all possible side effects. Call your doctor for medical advice about side effects. You may report side effects to FDA at 1-800-FDA-1088. Where should I keep my medicine? Keep out of the reach of children. Store at room temperature between 15 and 30 degrees C (59 and 86 degrees F). Throw away any unused medicine after the expiration date. NOTE: This sheet is a summary. It may not cover all possible information. If you have questions about this medicine, talk to your doctor, pharmacist, or health care provider.    2016, Elsevier/Gold Standard. (2014-07-04 10:50:39)  Metoprolol tablets What is this medicine? METOPROLOL (me TOE proe lole) is a beta-blocker. Beta-blockers reduce the workload on the heart and help it to beat more  regularly. This medicine is used to treat high blood pressure and to prevent chest pain. It is also used to after a heart attack and to prevent an additional heart attack from occurring. This medicine may be used for other purposes; ask your health care provider or pharmacist if you have questions. What should I tell my health care provider before I take this medicine? They need to know if you have any of these conditions: -diabetes -heart or vessel disease like slow heart rate, worsening heart failure, heart block, sick sinus syndrome or Raynaud's disease -kidney disease -liver disease -lung or breathing disease, like asthma or emphysema -pheochromocytoma -thyroid disease -an unusual or allergic reaction to metoprolol, other  beta-blockers, medicines, foods, dyes, or preservatives -pregnant or trying to get pregnant -breast-feeding How should I use this medicine? Take this medicine by mouth with a drink of water. Follow the directions on the prescription label. Take this medicine immediately after meals. Take your doses at regular intervals. Do not take more medicine than directed. Do not stop taking this medicine suddenly. This could lead to serious heart-related effects. Talk to your pediatrician regarding the use of this medicine in children. Special care may be needed. Overdosage: If you think you have taken too much of this medicine contact a poison control center or emergency room at once. NOTE: This medicine is only for you. Do not share this medicine with others. What if I miss a dose? If you miss a dose, take it as soon as you can. If it is almost time for your next dose, take only that dose. Do not take double or extra doses. What may interact with this medicine? This medicine may interact with the following medications: -certain medicines for blood pressure, heart disease, irregular heart beat -certain medicines for depression like monoamine oxidase (MAO) inhibitors, fluoxetine, or paroxetine -clonidine -dobutamine -epinephrine -isoproterenol -reserpine This list may not describe all possible interactions. Give your health care provider a list of all the medicines, herbs, non-prescription drugs, or dietary supplements you use. Also tell them if you smoke, drink alcohol, or use illegal drugs. Some items may interact with your medicine. What should I watch for while using this medicine? Visit your doctor or health care professional for regular check ups. Contact your doctor right away if your symptoms worsen. Check your blood pressure and pulse rate regularly. Ask your health care professional what your blood pressure and pulse rate should be, and when you should contact them. You may get drowsy or dizzy.  Do not drive, use machinery, or do anything that needs mental alertness until you know how this medicine affects you. Do not sit or stand up quickly, especially if you are an older patient. This reduces the risk of dizzy or fainting spells. Contact your doctor if these symptoms continue. Alcohol may interfere with the effect of this medicine. Avoid alcoholic drinks. What side effects may I notice from receiving this medicine? Side effects that you should report to your doctor or health care professional as soon as possible: -allergic reactions like skin rash, itching or hives -cold or numb hands or feet -depression -difficulty breathing -faint -fever with sore throat -irregular heartbeat, chest pain -rapid weight gain -swollen legs or ankles Side effects that usually do not require medical attention (report to your doctor or health care professional if they continue or are bothersome): -anxiety or nervousness -change in sex drive or performance -dry skin -headache -nightmares or trouble sleeping -short term memory loss -stomach upset or diarrhea -  unusually tired This list may not describe all possible side effects. Call your doctor for medical advice about side effects. You may report side effects to FDA at 1-800-FDA-1088. Where should I keep my medicine? Keep out of the reach of children. Store at room temperature between 15 and 30 degrees C (59 and 86 degrees F). Throw away any unused medicine after the expiration date. NOTE: This sheet is a summary. It may not cover all possible information. If you have questions about this medicine, talk to your doctor, pharmacist, or health care provider.    2016, Elsevier/Gold Standard. (2012-09-02 14:40:36)

## 2015-04-14 NOTE — Discharge Summary (Signed)
[facility] NAME: Jordan Figueroa    MR#:  [id number]  DATE OF BIRTH:  01-04-1928  DATE OF ADMISSION:  04/10/2015 ADMITTING PHYSICIAN: Henreitta Leber, MD  DATE OF DISCHARGE: 04/13/2015 10:56 AM  PRIMARY CARE PHYSICIAN: Juluis Pitch, MD    ADMISSION DIAGNOSIS:  Dizziness [R42] SVT (supraventricular tachycardia) (HCC) [I47.1] Atrial fibrillation with RVR ([place]) [I48.91]  DISCHARGE DIAGNOSIS:  Principal Problem:   Influenza Active Problems:   Paroxysmal SVT (supraventricular tachycardia) (HCC)   Dizziness   SECONDARY DIAGNOSIS:   Past Medical History  Diagnosis Date  . IBS (irritable bowel syndrome)   . Colon polyps   . GERD (gastroesophageal reflux disease)   . Hiatal hernia   . Hemorrhoids   . Cancer ([place])     skin    HOSPITAL COURSE:   1. Paroxysmal SVT. Patient was placed on amiodarone and metoprolol control heart rate. Patient became bradycardic and amiodarone was stopped and metoprolol will be continued upon discharge home. 2. Influenza positive patient was given Tamiflu and prescribed Tamiflu upon discharge home 3. Small pulmonary embolism seen on CT scan. This was likely asymptomatic but since we saw and I will treat it. Patient was given Xarelto while here in the hospital. There was a problem with the pharmacy filling the Xarelto prescription and I had to call and eliquis instead. The patient will take 5 mg 2 tablets twice a day for 1 week and then 5 mg twice a day afterwards. Benefits and risks of blood thinner explained to the patient and the patient's daughter and they understand the risks. 4. Antibiotics were stopped secondary to no pneumonia seen on CT scan   DISCHARGE CONDITIONS:   Satisfactory  CONSULTS OBTAINED:  Treatment Team:  Leonie Man, MD Dustin Flock, MD  DRUG ALLERGIES:  No Known Allergies  DISCHARGE MEDICATIONS:   Discharge Medication List as of 04/13/2015 10:18 AM     START taking these medications   Details  metoprolol tartrate (LOPRESSOR) 25 MG tablet Take 1 tablet (25 mg total) by mouth 2 (two) times daily., Starting 04/13/2015, Until Discontinued, Print    oseltamivir (TAMIFLU) 30 MG capsule Take 1 capsule (30 mg total) by mouth 2 (two) times daily., Starting 04/13/2015, Until Discontinued, Print    !! Rivaroxaban (XARELTO) 15 MG TABS tablet On tablet twice a day for 20 days, then switch to 20mg  daily afterwards, Print    !! rivaroxaban (XARELTO) 20 MG TABS tablet One tablet po daily.  After 20 days of twice a day dosing., Print     !! - Potential duplicate medications found. Please discuss with provider.    STOP taking these medications     guaiFENesin (MUCINEX) 600 MG 12 hr tablet      pseudoephedrine (SUDAFED) 30 MG tablet        Xarelto was stopped because of issue with pharmacy filling the prescription. Eliquis 5 mg. 2 tablets twice a day for 1 week and then one tablet twice a day afterwards was called into the pharmacy.  DISCHARGE INSTRUCTIONS:   Satisfactory  If you experience worsening of your admission symptoms, develop shortness of breath, life threatening emergency, suicidal or homicidal thoughts you must seek medical attention immediately by calling 911 or calling your MD immediately  if symptoms less severe.  You Must read complete instructions/literature along with all the possible adverse reactions/side effects for all the Medicines you take and that have been prescribed to you. Take any  new Medicines after you have completely understood and accept all the possible adverse reactions/side effects.   Please note  You were cared for by a hospitalist during your hospital stay. If you have any questions about your discharge medications or the care you received while you were in the hospital after you are discharged, you can call the unit and asked to speak with the hospitalist on call if the hospitalist that took care of you is not  available. Once you are discharged, your primary care physician will handle any further medical issues. Please note that NO REFILLS for any discharge medications will be authorized once you are discharged, as it is imperative that you return to your primary care physician (or establish a relationship with a primary care physician if you do not have one) for your aftercare needs so that they can reassess your need for medications and monitor your lab values.    Today   CHIEF COMPLAINT:   Chief Complaint  Patient presents with  . Irregular Heart Beat    HISTORY OF PRESENT ILLNESS:  Jordan Figueroa  is a 82 y.o. male presented with palpitations   VITAL SIGNS:  Blood pressure 119/66, pulse 54, temperature 97.9 F (36.6 C), temperature source Oral, resp. rate 19, height 5\' 11"  (1.803 m), weight 101.243 kg (223 lb 3.2 oz), SpO2 92 %.    PHYSICAL EXAMINATION:  GENERAL:  82 y.o.-year-old patient lying in the bed with no acute distress.  EYES: Pupils equal, round, reactive to light and accommodation. No scleral icterus. Extraocular muscles intact.  HEENT: Head atraumatic, normocephalic. Oropharynx and nasopharynx clear.  NECK:  Supple, no jugular venous distention. No thyroid enlargement, no tenderness.  LUNGS: Coarse breath sounds bilaterally, no wheezing, rales,rhonchi or crepitation. No use of accessory muscles of respiration.  CARDIOVASCULAR: S1, S2 bradycardic. No murmurs, rubs, or gallops.  ABDOMEN: Soft, non-tender, non-distended. Bowel sounds present. No organomegaly or mass.  EXTREMITIES: Trace edema, cyanosis, or clubbing.  NEUROLOGIC: Cranial nerves II through XII are intact. Muscle strength 5/5 in all extremities. Sensation intact. Gait not checked.  PSYCHIATRIC: The patient is alert and oriented x 3.  SKIN: No obvious rash, lesion, or ulcer.   DATA REVIEW:   CBC  Recent Labs Lab 04/13/15 0541  WBC 7.2  HGB 15.4  HCT 44.5  PLT 150    Chemistries   Recent Labs Lab  04/10/15 1356 04/13/15 0541  NA 136 137  K 3.7 3.5  CL 105 103  CO2 24 29  GLUCOSE 125* 113*  BUN 16 16  CREATININE 1.20 0.89  CALCIUM 8.6* 8.0*  MG 1.9  --   AST 24  --   ALT 22  --   ALKPHOS 73  --   BILITOT 0.6  --     Cardiac Enzymes  Recent Labs Lab 04/11/15 0335  TROPONINI 0.06*    Microbiology Results  Results for orders placed or performed during the hospital encounter of 04/10/15  MRSA PCR Screening     Status: None   Collection Time: 04/10/15  6:42 PM  Result Value Ref Range Status   MRSA by PCR NEGATIVE NEGATIVE Final    Comment:        The GeneXpert MRSA Assay (FDA approved for NASAL specimens only), is one component of a comprehensive MRSA colonization surveillance program. It is not intended to diagnose MRSA infection nor to guide or monitor treatment for MRSA infections.     RADIOLOGY:  US Venous Img Lower Bilateral  04/12/2015  CLINICAL DATA:  82 year old male with pulmonary embolus. EXAM: BILATERAL LOWER EXTREMITY VENOUS DOPPLER ULTRASOUND TECHNIQUE: Gray-scale sonography with graded compression, as well as color Doppler and duplex ultrasound were performed to evaluate the lower extremity deep venous systems from the level of the common femoral vein and including the common femoral, femoral, profunda femoral, popliteal and calf veins including the posterior tibial, peroneal and gastrocnemius veins when visible. The superficial great saphenous vein was also interrogated. Spectral Doppler was utilized to evaluate flow at rest and with distal augmentation maneuvers in the common femoral, femoral and popliteal veins. COMPARISON:  None. FINDINGS: Deep venous system appears patent and compressible from groin through popliteal fossae bilaterally. Spontaneous venous flow present with evidence of respiratory phasicity. Augmentation intact. No intraluminal thrombus identified. Visualized portions of the greater saphenous veins patent bilaterally. IMPRESSION: No  evidence of deep venous thrombosis. Electronically Signed   By: Margarette Canada M.D.   On: 04/12/2015 18:26   Management plans discussed with the patient, family and they are in agreement.  CODE STATUS:  Code Status History    Date Active Date Inactive Code Status Order ID Comments User Context   04/10/2015  6:33 PM 04/13/2015  1:56 PM Full Code [id number]  Henreitta Leber, MD Inpatient    Advance Directive Documentation        Most Recent Value   Type of Advance Directive  Living will   Pre-existing out of facility DNR order (yellow form or pink MOST form)     "MOST" Form in Place?        TOTAL TIME TAKING CARE OF THIS PATIENT: 35 minutes.    Loletha Grayer M.D on 04/13/2015 at 5 PM  Between 7am to 6pm - Pager - [phone]  After 6pm go to [email or web address] - password [email or web address]  Office  [phone]  CC: Primary care physician; Juluis Pitch, MD

## 2015-04-16 DIAGNOSIS — I480 Paroxysmal atrial fibrillation: Secondary | ICD-10-CM | POA: Insufficient documentation

## 2015-04-16 DIAGNOSIS — I2782 Chronic pulmonary embolism: Secondary | ICD-10-CM | POA: Insufficient documentation

## 2015-04-17 ENCOUNTER — Encounter: Payer: Self-pay | Admitting: Cardiology

## 2015-04-17 ENCOUNTER — Ambulatory Visit (INDEPENDENT_AMBULATORY_CARE_PROVIDER_SITE_OTHER): Payer: Medicare Other | Admitting: Cardiology

## 2015-04-17 VITALS — BP 134/64 | HR 59 | Ht 71.0 in | Wt 223.5 lb

## 2015-04-17 DIAGNOSIS — I471 Supraventricular tachycardia: Secondary | ICD-10-CM

## 2015-04-17 DIAGNOSIS — R42 Dizziness and giddiness: Secondary | ICD-10-CM

## 2015-04-17 MED ORDER — METOPROLOL TARTRATE 25 MG PO TABS: 12.5000 mg | ORAL_TABLET | Freq: Two times a day (BID) | ORAL | Status: DC

## 2015-04-17 NOTE — Progress Notes (Signed)
PCP: Kirk Ruths., MD  Clinic Note: Chief Complaint  Patient presents with  . other    F/u hospital SVT c/o chest pain. Meds reviewed verbally with pt.    HPI: Jordan Figueroa is a 82 y.o. male with a PMH below who presents today for Hospital follow-up. Use metered [facility] on March 29 with what amounted to be influenza A complicated by PSVT.  PSVT is initially broken with vagal maneuvers and adenosine, however recurred rapidly. Therefore we treated him with amiodarone intravenously in the ER. Subsequently was converted to by mouth. He was started on Tamiflu and then discharged over the weekend on simply beta blocker. He is also noted to have a small pulmonary embolus that was treated with Xarelto which is been converted to [facility].   Studies Reviewed:   Echo 04/10/2015: - Left ventricle: The cavity size was normal. Systolic function was  normal. The estimated ejection fraction was in the range of 60%  to 65%. Wall motion was normal; there were no regional wall  motion abnormalities. Doppler parameters are consistent with  abnormal left ventricular relaxation (grade 1 diastolic  dysfunction). - Mitral valve: There was mild regurgitation. - Left atrium: The atrium was normal in size.  - Right ventricle: Systolic function was normal. - Pulmonary arteries: Systolic pressure was mildly elevated. PA  peak pressure: 38 mm Hg (S).  Interval History: Jordan Figueroa presents today denying any recurrent rapid irregular heartbeats or palpitations. He still feeling quite fatigued and achy from having the flu, but has had a notable improvement from his initial presenting symptoms. Unfortunately his daughter (much like myself) contracted influenza from him. He is having some musculoskeletal type chest discomfort from lots of coughing, but has not had any anginal type symptoms. No syncope or near-syncope, TIA or amaurosis fugax. No PND, orthopnea or edema. No exertional dyspnea. No melena,  hematochezia, hematuria, or epstaxis. No claudication.  ROS: A comprehensive was performed. Review of Systems  Constitutional: Positive for malaise/fatigue (Getting over the flu). Negative for fever and chills.  HENT: Negative for congestion and nosebleeds.   Eyes: Negative for blurred vision.  Respiratory: Positive for cough and sputum production (Becoming less productive).   Cardiovascular: Negative.        Per history of present illness  Gastrointestinal: Negative for diarrhea and constipation.  Musculoskeletal: Positive for myalgias (From the flu, getting better).  Neurological: Negative for dizziness and headaches.  Endo/Heme/Allergies: Does not bruise/bleed easily.  Psychiatric/Behavioral: Negative for depression and memory loss. The patient is not nervous/anxious.   All other systems reviewed and are negative.   Past Medical History  Diagnosis Date  . IBS (irritable bowel syndrome)   . Colon polyps   . GERD (gastroesophageal reflux disease)   . Hiatal hernia   . Hemorrhoids   . Pulmonary embolism ([place])   . Cancer [facility])     skin    Past Surgical History  Procedure Laterality Date  . Colon surgery  07/14/2013    small bowel obstruction  . Skin surgery      Prior to Admission medications   Medication Sig Start Date End Date Taking? Authorizing Provider  apixaban (ELIQUIS) 5 MG TABS tablet Take 5 mg by mouth 2 (two) times daily.   Yes Historical Provider, MD  metoprolol tartrate (LOPRESSOR) 25 MG tablet Take 1 tablet (25 mg total) by mouth 2 (two) times daily. 04/13/15  Yes Loletha Grayer, MD   No Known Allergies   Social History   Social History  .  Marital Status: Widowed    Spouse Name: N/A  . Number of Children: N/A  . Years of Education: N/A   Social History Main Topics  . Smoking status: Former Smoker -- 20.00 packs/day for 2 years    Types: Cigarettes  . Smokeless tobacco: Never Used  . Alcohol Use: No  . Drug Use: No  . Sexual Activity: Not Asked    Other Topics Concern  . None   Social History Narrative   Family History  Problem Relation Age of Onset  . CVA Father   . Alzheimer's disease Mother   . Heart Problems Sister   . Heart attack Brother     Wt Readings from Last 3 Encounters:  04/17/15 223 lb 8 oz (101.379 kg)  04/11/15 223 lb 3.2 oz (101.243 kg)  08/22/13 206 lb (93.441 kg)    PHYSICAL EXAM BP 134/64 mmHg  Pulse 59  Ht 5\' 11"  (1.803 m)  Wt 223 lb 8 oz (101.379 kg)  BMI 31.19 kg/m2 General appearance: alert, cooperative, appears stated age, no distress and Mildly obese HEENT: [place]/AT, EOMI, MMM, anicteric sclera Neck: no adenopathy, no carotid bruit and no JVD Lungs: clear to auscultation bilaterally, normal percussion bilaterally and non-labored Heart: regular rate and rhythm, S1, S2 normal, no murmur, click, rub or gallop; nondisplaced PMI Abdomen: soft, non-tender; bowel sounds normal; no masses,  no organomegaly; no HJR Extremities: extremities normal, atraumatic, no cyanosis, or edema Pulses: 2+ and symmetric;  Skin: mobility and turgor normal, no edema and no lesions noted  Neurologic: Mental status: Alert, oriented, thought content appropriate Cranial nerves: normal (II-XII grossly intact)    Adult ECG Report  Rate: 59 ;  Rhythm: sinus bradycardia, premature ventricular contractions (PVC) and 1 A-V block (PR interval 252). Left axis deviation (-30);   Narrative Interpretation: Otherwise normal EKG  Other studies Reviewed: Additional studies/ records that were reviewed today include:  Recent Labs:   Lab Results  Component Value Date   CREATININE 0.89 04/13/2015    ASSESSMENT / PLAN: Problem List Items Addressed This Visit    Paroxysmal SVT (supraventricular tachycardia) ([place]) - Primary    No recurrent episodes. With resting bradycardia and some dizziness/fatigue, I will back off on metoprolol to 12 have twice a day. For breakthrough SVT/ heart rate, take 25mg  metoprolol. If heart  rate/rhythm is not improved within 10 minutes, call EMS. We also discussed vagal maneuvers.      Relevant Medications   apixaban (ELIQUIS) 5 MG TABS tablet   metoprolol tartrate (LOPRESSOR) 25 MG tablet   Other Relevant Orders   EKG 12-Lead (Completed)   Dizziness    I can tell the dizziness is still residual from the influenza or chronotropic incompetence. He does have a lot of fatigue and lack of get up and go. I will back off on his beta blocker as indicated.         Current medicines are reviewed at length with the patient today. (+/- concerns) None The following changes have been made:  DECREASE metoprolol to 12.5mg  twice daily For breakthrough afib/increased heart rate, take 25mg  metoprolol. If heart rate/rhythm is not improved within 10 minutes, call EMS.   Studies Ordered:   Orders Placed This Encounter  Procedures  . EKG 12-Lead   ROV 4 month   HARDING, Leonie Green, M.D., M.S. Interventional Cardiologist   Pager # [phone] Phone # [phone] [address] [place], [place] [address]

## 2015-04-17 NOTE — Patient Instructions (Signed)
Medication Instructions:  Your physician has recommended you make the following change in your medication:  DECREASE metoprolol to 12.5mg  twice daily For breakthrough afib/increased heart rate, take 25mg  metoprolol. If heart rate/rhythm is not improved within 10 minutes, call EMS.   Labwork: none  Testing/Procedures: none  Follow-Up: Your physician wants you to follow-up in: August with Dr. Ellyn Hack.  You will receive a reminder letter in the mail two months in advance. If you don't receive a letter, please call our office to schedule the follow-up appointment.    Any Other Special Instructions Will Be Listed Below (If Applicable).     If you need a refill on your cardiac medications before your next appointment, please call your pharmacy.

## 2015-04-19 ENCOUNTER — Encounter: Payer: Self-pay | Admitting: Cardiology

## 2015-04-19 NOTE — Assessment & Plan Note (Signed)
I can tell the dizziness is still residual from the influenza or chronotropic incompetence. He does have a lot of fatigue and lack of get up and go. I will back off on his beta blocker as indicated.

## 2015-04-19 NOTE — Assessment & Plan Note (Signed)
No recurrent episodes. With resting bradycardia and some dizziness/fatigue, I will back off on metoprolol to 12 have twice a day. For breakthrough SVT/ heart rate, take 25mg  metoprolol. If heart rate/rhythm is not improved within 10 minutes, call EMS. We also discussed vagal maneuvers.

## 2015-05-07 ENCOUNTER — Telehealth: Payer: Self-pay | Admitting: Cardiology

## 2015-05-07 MED ORDER — APIXABAN 5 MG PO TABS: 5.0000 mg | ORAL_TABLET | Freq: Two times a day (BID) | ORAL | Status: AC

## 2015-05-07 NOTE — Telephone Encounter (Signed)
Refill sent for Eliquis

## 2015-05-07 NOTE — Telephone Encounter (Signed)
°*  STAT* If patient is at the pharmacy, call can be transferred to refill team.   1. Which medications need to be refilled? (please list name of each medication and dose if known) Eliquis  2. Which pharmacy/location (including street and city if local pharmacy) is medication to [facility] sent to? [facility] [address]  3. Do they need a 30 day or 90 day supply? 90 day

## 2015-05-09 ENCOUNTER — Telehealth: Payer: Self-pay

## 2015-05-09 NOTE — Telephone Encounter (Signed)
Eliquis approved through 01/12/2016. [id number].

## 2015-05-09 NOTE — Telephone Encounter (Signed)
Eliquis approved through 01/12/2016. PA- [id number].

## 2015-05-31 ENCOUNTER — Telehealth: Payer: Self-pay | Admitting: Cardiology

## 2015-05-31 NOTE — Telephone Encounter (Signed)
°*  STAT* If patient is at the pharmacy, call can be transferred to refill team.   1. Which medications need to be refilled? (please list name of each medication and dose if known) metoprolol  25 mg po x 2 daily   2. Which pharmacy/location (including street and city if local pharmacy) is medication to be sent to?  [facility]   3. Do they need a 30 day or 90 day supply? [place]

## 2015-06-03 ENCOUNTER — Telehealth: Payer: Self-pay | Admitting: Cardiology

## 2015-06-03 ENCOUNTER — Other Ambulatory Visit: Payer: Self-pay | Admitting: *Deleted

## 2015-06-03 MED ORDER — METOPROLOL TARTRATE 25 MG PO TABS: 12.5000 mg | ORAL_TABLET | Freq: Two times a day (BID) | ORAL | Status: DC

## 2015-06-03 NOTE — Telephone Encounter (Signed)
Requested Prescriptions   Signed Prescriptions Disp Refills  . metoprolol tartrate (LOPRESSOR) 25 MG tablet 60 tablet 3    Sig: Take 0.5 tablets (12.5 mg total) by mouth 2 (two) times daily.    Authorizing Provider: Leonie Man    Ordering User: Britt Bottom

## 2015-06-03 NOTE — Telephone Encounter (Signed)
°*  STAT* If patient is at the pharmacy, call can be transferred to refill team.   1. Which medications need to be refilled? (please list name of each medication and dose if known)  Metoprolol 12.5 mg po x 2  daily   2. Which pharmacy/location (including street and city if local pharmacy) is medication to be sent to? [facility] [place] [address]   3. Do they need a 30 day or 90 day supply? [place]

## 2015-06-05 ENCOUNTER — Ambulatory Visit (INDEPENDENT_AMBULATORY_CARE_PROVIDER_SITE_OTHER): Payer: Medicare Other | Admitting: Cardiology

## 2015-06-05 ENCOUNTER — Encounter: Payer: Self-pay | Admitting: Cardiology

## 2015-06-05 ENCOUNTER — Encounter (INDEPENDENT_AMBULATORY_CARE_PROVIDER_SITE_OTHER): Payer: Self-pay

## 2015-06-05 VITALS — BP 128/64 | HR 69 | Ht 71.0 in | Wt 224.8 lb

## 2015-06-05 DIAGNOSIS — I471 Supraventricular tachycardia: Secondary | ICD-10-CM

## 2015-06-05 DIAGNOSIS — R0602 Shortness of breath: Secondary | ICD-10-CM

## 2015-06-05 DIAGNOSIS — R079 Chest pain, unspecified: Secondary | ICD-10-CM | POA: Diagnosis not present

## 2015-06-05 DIAGNOSIS — I2699 Other pulmonary embolism without acute cor pulmonale: Secondary | ICD-10-CM | POA: Diagnosis not present

## 2015-06-05 MED ORDER — ASPIRIN EC 81 MG PO TBEC: 81.0000 mg | DELAYED_RELEASE_TABLET | Freq: Every day | ORAL | Status: DC

## 2015-06-05 MED ORDER — NITROGLYCERIN 0.4 MG SL SUBL: 0.4000 mg | SUBLINGUAL_TABLET | SUBLINGUAL | Status: DC | PRN

## 2015-06-05 NOTE — Progress Notes (Signed)
PCP: Kirk Ruths., MD  Clinic Note: Chief Complaint  Patient presents with  . other    Pt. c/o difficulty getting a deep breath, has no energy, chest pain and shortness of breath with little exertion.   . Tachycardia    h/o PSVT    HPI: Jordan Figueroa is a 82 y.o. male with a PMH below who presents today for 6 week follow-up after admitted for Influenza complicated by PSVT.  Maanas Haran was last seen on 04/17/2015.  He was doing quite well. He denied any further episodes of rapid heartbeat. He had some mild musculoskeletal type chest discomfort but nothing really significant.  Recent Hospitalizations: None since his flu hospitalization  Studies Reviewed: No new studies  Interval History: [name] presents today sooner than originally scheduled with complaints of worsening exertional dyspnea and fatigue. He also had one prolonged episode of substernal chest discomfort radiating up toward his chest, couple weeks ago. Since then he's noted that the exertional dyspnea has gotten worse. He had one episode of chest discomfort that was not as long. He does not really note any exertional chest tightness unless he really pushes it. He has not had any PND or orthopnea but does have some mild edema. His coughing is significantly improved. No fevers or chills. He has not had any recurrence of rapid or irregular heartbeat/rhythms. No lightheadedness, dizziness, weakness or syncope/near syncope. No TIA/amaurosis fugax symptoms. No melena, hematochezia, hematuria, or epstaxis. No claudication.  ROS: A comprehensive was performed. Review of Systems  Constitutional: Positive for malaise/fatigue (Exercise intolerance due to dyspnea).  HENT: Negative for congestion and nosebleeds.   Respiratory: Positive for cough (Much improved) and shortness of breath. Negative for sputum production and wheezing.   Cardiovascular: Negative for claudication and leg swelling.  Gastrointestinal: Negative for  blood in stool and melena.  Neurological: Negative for dizziness, loss of consciousness and headaches.  Endo/Heme/Allergies: Does not bruise/bleed easily.  Psychiatric/Behavioral: Negative for depression and memory loss. The patient is not nervous/anxious and does not have insomnia.   All other systems reviewed and are negative.   Past Medical History  Diagnosis Date  . IBS (irritable bowel syndrome)   . Colon polyps   . GERD (gastroesophageal reflux disease)   . Hiatal hernia   . Hemorrhoids   . Pulmonary embolism ([place])   . Cancer ([place])     skin  . Paroxysmal supraventricular tachycardia [facility]) March 2017    First documented during episode of influenza    Past Surgical History  Procedure Laterality Date  . Colon surgery  07/14/2013    small bowel obstruction  . Skin surgery    . Transthoracic echocardiogram  04/10/2015    EF 60-65%. No RWMA, GR 1 DD. Mild PA pressure elevation of 38 mmHg.    Prior to Admission medications   Medication Sig Start Date End Date Taking? Authorizing Provider  apixaban (ELIQUIS) 5 MG TABS tablet Take 1 tablet (5 mg total) by mouth 2 (two) times daily. 05/07/15  Yes Leonie Man, MD  metoprolol tartrate (LOPRESSOR) 25 MG tablet Take 0.5 tablets (12.5 mg total) by mouth 2 (two) times daily. 06/03/15  Yes Leonie Man, MD   No Known Allergies   Social History   Social History  . Marital Status: Widowed    Spouse Name: N/A  . Number of Children: N/A  . Years of Education: N/A   Social History Main Topics  . Smoking status: Former Smoker -- 2.00 packs/day for 25 years  Types: Cigarettes    Quit date: 02/25/1973  . Smokeless tobacco: Never Used  . Alcohol Use: No  . Drug Use: No  . Sexual Activity: Not Asked   Other Topics Concern  . None   Social History Narrative   family history includes Alzheimer's disease in his mother; CVA in his father; Heart Problems in his sister; Heart attack in his brother.   Wt Readings from Last 3  Encounters:  06/05/15 224 lb 12 oz (101.946 kg)  04/17/15 223 lb 8 oz (101.379 kg)  04/11/15 223 lb 3.2 oz (101.243 kg)    PHYSICAL EXAM BP 128/64 mmHg  Pulse 69  Ht 5\' 11"  (1.803 m)  Wt 224 lb 12 oz (101.946 kg)  BMI 31.36 kg/m2  SpO2 98% General appearance: alert, cooperative, appears stated age, no distress and Mildly obese HEENT: [place]/AT, EOMI, MMM, anicteric sclera Neck: no adenopathy, no carotid bruit and no JVD Lungs: clear to auscultation bilaterally, normal percussion bilaterally and non-labored Heart: regular rate and rhythm, S1, S2 normal, no murmur, click, rub or gallop; nondisplaced PMI Abdomen: soft, non-tender; bowel sounds normal; no masses, no organomegaly; no HJR Extremities: extremities normal, atraumatic, no cyanosis, or edema Pulses: 2+ and symmetric;  Skin: mobility and turgor normal, no edema and no lesions noted  Neurologic: Mental status: Alert, oriented, thought content appropriate Cranial nerves: normal (II-XII grossly intact)   Adult ECG Report  Rate: 67 ;  Rhythm: normal sinus rhythm and 1 A-V block (PR 256 ms), occasional PVC. Left axis deviation (-37);   Narrative Interpretation: Otherwise normal EKG   Other studies Reviewed: Additional studies/ records that were reviewed today include:  Recent Labs:   No results found for: CHOL, HDL, LDLCALC, LDLDIRECT, TRIG, CHOLHDL  ASSESSMENT / PLAN: Problem List Items Addressed This Visit    Pulmonary embolus ([place]) (Chronic)    He continues to be on ELIQUIS with no bleeding issues. I would imagine after 6 months this can be stopped.      Relevant Medications   aspirin EC 81 MG tablet   nitroGLYCERIN (NITROSTAT) 0.4 MG SL tablet   Paroxysmal SVT (supraventricular tachycardia) ([facility]) (Chronic)    No recurrence.  I backed off on the beta blocker last visit due to fatigue and resting bradycardia. He still has exertional dyspnea, but I doubt that his beta blocker related. Continue low-dose beta blocker.  Discussed when necessary use of additional dosage as well as vagal maneuvers for recurrence of SVT.      Relevant Medications   aspirin EC 81 MG tablet   nitroGLYCERIN (NITROSTAT) 0.4 MG SL tablet   Exertional shortness of breath    This may be some residual from his recent illness, but I cannot exclude an ischemic etiology. Plan is to proceed with Myoview stress test to exclude ischemia      Relevant Orders   EKG 12-Lead (Completed)   NM Myocar Multi W/Spect W/Wall Motion / EF   Chest pain with moderate risk for cardiac etiology - Primary    A lytic concerned that Mr. Giampapa now is having a prolonged episode of chest pain and then exertional dyspnea. He had an echocardiogram done not that long ago which was pretty normal. With this new onset of exertional dyspnea after chest pain and concern for possible coronary disease. He is on low-dose beta blocker. I've asked him to start taking a baby aspirin daily. Plan: Treadmill Myoview (okay to convert to [place] if necessary); he will hold his metoprolol the morning of  the test, but would otherwise continue with taking it as directed.      Relevant Orders   EKG 12-Lead (Completed)     Current medicines are reviewed at length with the patient today. (+/- concerns) None The following changes have been made: No changes   Studies Ordered:   Orders Placed This Encounter  Procedures  . NM Myocar Multi W/Spect W/Wall Motion / EF  . EKG 12-Lead      Glenetta Hew, M.D., M.S. Interventional Cardiologist   Pager # [phone] Phone # [phone] [address]. [place], [place] [address]

## 2015-06-05 NOTE — Patient Instructions (Addendum)
Medication Instructions:  Your physician recommends that you continue on your current medications as directed. Please refer to the Current Medication list given to you today.   Labwork: none  Testing/Procedures: Your physician has requested that you have a lexiscan myoview. For further information please visit [email or web address]. Please follow instruction sheet, as given.  [place]  Your caregiver has ordered a Stress Test with nuclear imaging. The purpose of this test is to evaluate the blood supply to your heart muscle. This procedure is referred to as a "Non-Invasive Stress Test." This is because other than having an IV started in your vein, nothing is inserted or "invades" your body. Cardiac stress tests are done to find areas of poor blood flow to the heart by determining the extent of coronary artery disease (CAD). Some patients exercise on a treadmill, which naturally increases the blood flow to your heart, while others who are  unable to walk on a treadmill due to physical limitations have a pharmacologic/chemical stress agent called Lexiscan . This medicine will mimic walking on a treadmill by temporarily increasing your coronary blood flow.   Please note: these test may take anywhere between 2-4 hours to complete  PLEASE REPORT TO [facility] AT THE FIRST DESK WILL DIRECT YOU WHERE TO GO  Date of Procedure: Friday, May 26 Arrival Time for Procedure:   7:45am  Instructions regarding medication:    xx____:  Hold metoprolol the morning of procedure   PLEASE NOTIFY THE OFFICE AT LEAST 24 HOURS IN ADVANCE IF YOU ARE UNABLE TO KEEP YOUR APPOINTMENT.  [phone] AND  PLEASE NOTIFY NUCLEAR MEDICINE AT [facility] AT LEAST 24 HOURS IN ADVANCE IF YOU ARE UNABLE TO KEEP YOUR APPOINTMENT. [phone]  How to prepare for your Myoview test:   Do not eat or drink after midnight  No caffeine for 24 hours prior to test  No smoking 24 hours prior to  test.  Your medication may be taken with water.  If your doctor stopped a medication because of this test, do not take that medication.  Ladies, please do not wear dresses.  Skirts or pants are appropriate. Please wear a short sleeve shirt.  No perfume, cologne or lotion.  Wear comfortable walking shoes. No heels!            Follow-Up: Your physician recommends that you schedule a follow-up appointment with Dr. Ellyn Hack after [facility].   Any Other Special Instructions Will Be Listed Below (If Applicable).     If you need a refill on your cardiac medications before your next appointment, please call your pharmacy.  Cardiac Nuclear Scanning A cardiac nuclear scan is used to check your heart for problems, such as the following:  A portion of the heart is not getting enough blood.  Part of the heart muscle has died, which happens with a heart attack.  The heart wall is not working normally.  In this test, a radioactive dye (tracer) is injected into your bloodstream. After the tracer has traveled to your heart, a scanning device is used to measure how much of the tracer is absorbed by or distributed to various areas of your heart. LET [facility] CARE PROVIDER KNOW ABOUT:  Any allergies you have.  All medicines you are taking, including vitamins, herbs, eye drops, creams, and over-the-counter medicines.  Previous problems you or members of your family have had with the use of anesthetics.  Any blood disorders you have.  Previous surgeries you have had.  Medical conditions you have.  RISKS AND COMPLICATIONS Generally, this is a safe procedure. However, as with any procedure, problems can occur. Possible problems include:   Serious chest pain.  Rapid heartbeat.  Sensation of warmth in your chest. This usually passes quickly. BEFORE THE PROCEDURE Ask your health care provider about changing or stopping your regular medicines. PROCEDURE This procedure is usually  done at a hospital and takes 2-4 hours.  An IV tube is inserted into one of your veins.  Your health care provider will inject a small amount of radioactive tracer through the tube.  You will then wait for 20-40 minutes while the tracer travels through your bloodstream.  You will lie down on an exam table so images of your heart can be taken. Images will be taken for about 15-20 minutes.  You will exercise on a treadmill or stationary bike. While you exercise, your heart activity will be monitored with an electrocardiogram (ECG), and your blood pressure will be checked.  If you are unable to exercise, you may be given a medicine to make your heart beat faster.  When blood flow to your heart has peaked, tracer will again be injected through the IV tube.  After 20-40 minutes, you will get back on the exam table and have more images taken of your heart.  When the procedure is over, your IV tube will be removed. AFTER THE PROCEDURE  You will likely be able to leave shortly after the test. Unless your health care provider tells you otherwise, you may return to your normal schedule, including diet, activities, and medicines.  Make sure you find out how and when you will get your test results.   This information is not intended to replace advice given to you by your health care provider. Make sure you discuss any questions you have with your health care provider.   Document Released: 01/24/2004 Document Revised: 01/03/2013 Document Reviewed: 12/07/2012 Elsevier Interactive Patient Education [employer].

## 2015-06-06 ENCOUNTER — Encounter: Payer: Self-pay | Admitting: Cardiology

## 2015-06-06 ENCOUNTER — Telehealth: Payer: Self-pay | Admitting: Cardiology

## 2015-06-06 DIAGNOSIS — R0789 Other chest pain: Secondary | ICD-10-CM | POA: Insufficient documentation

## 2015-06-06 DIAGNOSIS — R0609 Other forms of dyspnea: Secondary | ICD-10-CM

## 2015-06-06 DIAGNOSIS — R079 Chest pain, unspecified: Secondary | ICD-10-CM | POA: Insufficient documentation

## 2015-06-06 DIAGNOSIS — Z86711 Personal history of pulmonary embolism: Secondary | ICD-10-CM | POA: Insufficient documentation

## 2015-06-06 NOTE — Assessment & Plan Note (Signed)
No recurrence.  I backed off on the beta blocker last visit due to fatigue and resting bradycardia. He still has exertional dyspnea, but I doubt that his beta blocker related. Continue low-dose beta blocker. Discussed when necessary use of additional dosage as well as vagal maneuvers for recurrence of SVT.

## 2015-06-06 NOTE — Telephone Encounter (Signed)
I have communicated this with Dr. Ellyn Hack who confirmed he will sign orders before pt [facility] tomorrow

## 2015-06-06 NOTE — Assessment & Plan Note (Addendum)
A lytic concerned that Jordan Figueroa now is having a prolonged episode of chest pain and then exertional dyspnea. He had an echocardiogram done not that long ago which was pretty normal. With this new onset of exertional dyspnea after chest pain and concern for possible coronary disease. He is on low-dose beta blocker. I've asked him to start taking a baby aspirin daily. Plan: Treadmill Myoview (okay to convert to [facility] if necessary); he will hold his metoprolol the morning of the test, but would otherwise continue with taking it as directed.

## 2015-06-06 NOTE — Assessment & Plan Note (Signed)
This may be some residual from his recent illness, but I cannot exclude an ischemic etiology. Plan is to proceed with Myoview stress test to exclude ischemia

## 2015-06-06 NOTE — Telephone Encounter (Signed)
[facility] nuc med calling stating pt is coming for Myoview on 06/07/15  But the orders are not signed by Dr Ellyn Hack.  They need this done before patient comes tomorrow

## 2015-06-06 NOTE — Assessment & Plan Note (Signed)
He continues to be on ELIQUIS with no bleeding issues. I would imagine after 6 months this can be stopped.

## 2015-06-07 ENCOUNTER — Encounter
Admission: RE | Admit: 2015-06-07 | Discharge: 2015-06-07 | Disposition: A | Discharge status: Home / Self Care | Payer: Medicare Other | Source: Ambulatory Visit | Attending: Cardiology | Admitting: Cardiology

## 2015-06-07 DIAGNOSIS — R0602 Shortness of breath: Secondary | ICD-10-CM | POA: Insufficient documentation

## 2015-06-07 LAB — NM MYOCAR MULTI W/SPECT W/WALL MOTION / EF
CSEPED: 4 min
CSEPEW: 4.6 METS
CSEPHR: 88 %
CSEPPHR: 120 {beats}/min
Exercise duration (sec): 0 s
LV dias vol: 124 mL (ref 62–150)
LVSYSVOL: 59 mL
MPHR: 135 {beats}/min
NUC STRESS TID: 0.97
Rest HR: 57 {beats}/min
SDS: 0
SRS: 2
SSS: 2

## 2015-06-07 MED ORDER — TECHNETIUM TC 99M TETROFOSMIN IV KIT
30.0000 | PACK | Freq: Once | INTRAVENOUS | Status: AC | PRN
  Administered 2015-06-07: 31.55 via INTRAVENOUS

## 2015-06-07 MED ORDER — TECHNETIUM TC 99M TETROFOSMIN IV KIT
13.0000 | PACK | Freq: Once | INTRAVENOUS | Status: AC | PRN
  Administered 2015-06-07: 13.586 via INTRAVENOUS

## 2015-06-26 ENCOUNTER — Other Ambulatory Visit
Admission: RE | Admit: 2015-06-26 | Discharge: 2015-06-26 | Disposition: A | Discharge status: Home / Self Care | Payer: Medicare Other | Source: Ambulatory Visit | Attending: Cardiology | Admitting: Cardiology

## 2015-06-26 ENCOUNTER — Encounter: Payer: Self-pay | Admitting: Cardiology

## 2015-06-26 ENCOUNTER — Other Ambulatory Visit: Payer: Self-pay | Admitting: Cardiology

## 2015-06-26 ENCOUNTER — Ambulatory Visit (INDEPENDENT_AMBULATORY_CARE_PROVIDER_SITE_OTHER): Payer: Medicare Other | Admitting: Cardiology

## 2015-06-26 VITALS — BP 122/60 | HR 56 | Ht 71.0 in | Wt 223.8 lb

## 2015-06-26 DIAGNOSIS — Z01812 Encounter for preprocedural laboratory examination: Secondary | ICD-10-CM | POA: Diagnosis not present

## 2015-06-26 DIAGNOSIS — R931 Abnormal findings on diagnostic imaging of heart and coronary circulation: Secondary | ICD-10-CM

## 2015-06-26 DIAGNOSIS — R0602 Shortness of breath: Secondary | ICD-10-CM

## 2015-06-26 DIAGNOSIS — I471 Supraventricular tachycardia: Secondary | ICD-10-CM

## 2015-06-26 DIAGNOSIS — R9439 Abnormal result of other cardiovascular function study: Secondary | ICD-10-CM

## 2015-06-26 DIAGNOSIS — R079 Chest pain, unspecified: Secondary | ICD-10-CM | POA: Diagnosis not present

## 2015-06-26 LAB — CBC
HCT: 48.7 % (ref 40.0–52.0)
Hemoglobin: 16.3 g/dL (ref 13.0–18.0)
MCH: 28.9 pg (ref 26.0–34.0)
MCHC: 33.5 g/dL (ref 32.0–36.0)
MCV: 86.2 fL (ref 80.0–100.0)
PLATELETS: 190 10*3/uL (ref 150–440)
RBC: 5.64 MIL/uL (ref 4.40–5.90)
RDW: 14.5 % (ref 11.5–14.5)
WBC: 8 10*3/uL (ref 3.8–10.6)

## 2015-06-26 LAB — BASIC METABOLIC PANEL
Anion gap: 7 (ref 5–15)
BUN: 22 mg/dL — ABNORMAL HIGH (ref 6–20)
CHLORIDE: 108 mmol/L (ref 101–111)
CO2: 23 mmol/L (ref 22–32)
CREATININE: 1.05 mg/dL (ref 0.61–1.24)
Calcium: 9.1 mg/dL (ref 8.9–10.3)
GFR calc non Af Amer: >60 mL/min (ref >60)
Glucose, Bld: 96 mg/dL (ref 65–99)
Potassium: 4.1 mmol/L (ref 3.5–5.1)
Sodium: 138 mmol/L (ref 135–145)

## 2015-06-26 LAB — PROTIME-INR
INR: 1.23
Prothrombin Time: 15.7 seconds — ABNORMAL HIGH (ref 11.4–15.0)

## 2015-06-26 NOTE — Assessment & Plan Note (Signed)
States this is definitely different brand.  Findings concerned about reduced EF noted on Myoview nuclear inferior defect noted after prolonged episodes of pain and persistent dyspnea on exertion.  Plan: Cardiac catheterization plus minus PCI.

## 2015-06-26 NOTE — Assessment & Plan Note (Signed)
Stress test is read as low risk, but there is definitely an inferior defect that I see. No wall motion abnormality was noted on previous echo, or potentially this echo, but the EF is read as 40% which is deathly lower than the echocardiogram showed the past. He is still symptomatic, and seeking closure. At this point, I cannot exclude that the inferior defect is in fact a recent MI that replaced one of his episodes of chest discomfort he had in the last few weeks.  Plan: Proceed with invasive evaluation using cardiac catheterization to delineate equivocal findings on the stress test. Still has continued symptoms. - Hold ELIQUIS in tonight until Friday morning precath. - Continue beta blocker.  - Pending Findings, may need to consider statin

## 2015-06-26 NOTE — Progress Notes (Signed)
PCP: Kirk Ruths., MD  Clinic Note: Chief Complaint  Patient presents with  . Follow-up    SOB with & without activity, per pt  . Shortness of Breath    HPI: Jordan Figueroa is a 82 y.o. male with a PMH below who presents today for One-month follow-up for PSVT and chest pain/dyspnea.Marland Kitchen  Jordan Figueroa was last seen on 06/06/2015 for chest pain and exertional dyspnea. I first saw him when he was hospitalized for influenza and was noted to have SVT.  Recent Hospitalizations: None  Studies Reviewed:    Myoview: Exercise myocardial perfusion imaging study with no significant ischemia Normal wall motion, EF estimated at 40% (depressed ejection fraction possibly secondary to mild GI uptake artifact) Small region of fixed apical thinning, likely secondary to attenuation artifact. No wall motion in this region No EKG changes concerning for ischemia at peak stress or in recovery. Rare PVCs noted Target heart rate achieved, exercised for 4 minutes, achieved 4.6 METS Low risk scan  Interval History: Jordan Figueroa presents today, basically stating that he still has pretty significant exertional dyspnea that is deathly different than his baseline. He can previously walk several miles without having dyspnea, now he cannot walk maybe half a mile without having to stop catch his breath. He has not had any more of the "elephant on his chest "chest heaviness with rest or exertion, but did have a couple episodes prior to his stress test. He thinks he may not be doing as much activity as he was at that time, he deathly states that something is "off for him ". He is quite concerned, and was not overly thrilled with the results of his stress test with a "artifact" that is difficult to explain.   No PND, orthopnea. Mild pedal edema  No palpitations, lightheadedness, dizziness, weakness or syncope/near syncope. No TIA/amaurosis fugax symptoms. No melena, hematochezia, hematuria, or epstaxis. No  claudication.  ROS: A comprehensive was performed. Review of Systems  Constitutional: Positive for malaise/fatigue (He definitely notes decreased exercise tolerance). Negative for fever and chills.  Eyes: Negative for blurred vision.  Respiratory: Positive for shortness of breath (Exertional).   Cardiovascular:       Per history of present illness  Gastrointestinal: Negative for heartburn and abdominal pain.  Musculoskeletal: Negative.   Neurological: Negative for loss of consciousness and headaches.  Endo/Heme/Allergies: Does not bruise/bleed easily.  Psychiatric/Behavioral: Negative for depression and memory loss. The patient is not nervous/anxious and does not have insomnia.   All other systems reviewed and are negative.   Past Medical History  Diagnosis Date  . IBS (irritable bowel syndrome)   . Colon polyps   . GERD (gastroesophageal reflux disease)   . Hiatal hernia   . Hemorrhoids   . Pulmonary embolism ([place])   . Cancer ([place])     skin  . Paroxysmal supraventricular tachycardia [facility]) March 2017    First documented during episode of influenza    Past Surgical History  Procedure Laterality Date  . Colon surgery  07/14/2013    small bowel obstruction  . Skin surgery    . Transthoracic echocardiogram  04/10/2015    EF 60-65%. No RWMA, GR 1 DD. Mild PA pressure elevation of 38 mmHg.   Prior to Admission medications   Medication Sig Start Date End Date Taking? Authorizing Provider  apixaban (ELIQUIS) 5 MG TABS tablet Take 1 tablet (5 mg total) by mouth 2 (two) times daily. 05/07/15  Yes Leonie Man, MD  aspirin EC 81  MG tablet Take 1 tablet (81 mg total) by mouth daily. 06/05/15  Yes Leonie Man, MD  metoprolol tartrate (LOPRESSOR) 25 MG tablet Take 0.5 tablets (12.5 mg total) by mouth 2 (two) times daily. 06/03/15  Yes Leonie Man, MD  nitroGLYCERIN (NITROSTAT) 0.4 MG SL tablet Place 1 tablet (0.4 mg total) under the tongue every 5 (five) minutes as needed for  chest pain. 06/05/15  Yes Leonie Man, MD   No Known Allergies   Social History   Social History  . Marital Status: Widowed    Spouse Name: N/A  . Number of Children: N/A  . Years of Education: N/A   Social History Main Topics  . Smoking status: Former Smoker -- 2.00 packs/day for 25 years    Types: Cigarettes    Quit date: 02/25/1973  . Smokeless tobacco: Never Used  . Alcohol Use: No  . Drug Use: No  . Sexual Activity: Not Asked   Other Topics Concern  . None   Social History Narrative    Family History  Problem Relation Age of Onset  . CVA Father   . Alzheimer's disease Mother   . Heart Problems Sister   . Heart attack Brother      Wt Readings from Last 3 Encounters:  06/26/15 223 lb 12.8 oz (101.515 kg)  06/05/15 224 lb 12 oz (101.946 kg)  04/17/15 223 lb 8 oz (101.379 kg)    PHYSICAL EXAM BP 122/60 mmHg  Pulse 56  Ht 5\' 11"  (1.803 m)  Wt 223 lb 12.8 oz (101.515 kg)  BMI 31.23 kg/m2  SpO2 92% General appearance: alert, cooperative, appears stated age, no distress and Mildly obese HEENT: [place]/AT, EOMI, MMM, anicteric sclera Neck: no adenopathy, no carotid bruit and no JVD Lungs: clear to auscultation bilaterally, normal percussion bilaterally and non-labored Heart: regular rate and rhythm, S1, S2 normal, no murmur, click, rub or gallop; nondisplaced PMI Abdomen: soft, non-tender; bowel sounds normal; no masses, no organomegaly; no HJR Extremities: extremities normal, atraumatic, no cyanosis, or edema Pulses: 2+ and symmetric;  Skin: mobility and turgor normal, no edema and no lesions noted  Neurologic: Mental status: Alert, oriented, thought content appropriate Cranial nerves: normal (II-XII grossly intact)    Adult ECG Report n/a   Other studies Reviewed: Additional studies/ records that were reviewed today include:  Recent Labs:  Pending pre-cath labs     ASSESSMENT / PLAN: Problem List Items Addressed This Visit    Paroxysmal SVT  (supraventricular tachycardia) (HCC) (Chronic)    No further recurrence. On beta blocker      Exertional shortness of breath    States this is definitely different brand.  Findings concerned about reduced EF noted on Myoview nuclear inferior defect noted after prolonged episodes of pain and persistent dyspnea on exertion.  Plan: Cardiac catheterization plus minus PCI.      Chest pain with moderate risk for cardiac etiology    Thankfully, he is not having anymore of these prolonged chest pain episodes, but still has exertional dyspnea and there is a defect noted on his stress test with reduced EF.  Options to clarify these findings on nuclear stress test are to recheck an echocardiogram to look for reduced EF or not versus invasive evaluation. After discussing the options, he would prefer to go with the more definitive evaluation of cardiac catheterization.      Abnormal nuclear stress test    Stress test is read as low risk, but there is definitely an  inferior defect that I see. No wall motion abnormality was noted on previous echo, or potentially this echo, but the EF is read as 40% which is deathly lower than the echocardiogram showed the past. He is still symptomatic, and seeking closure. At this point, I cannot exclude that the inferior defect is in fact a recent MI that replaced one of his episodes of chest discomfort he had in the last few weeks.  Plan: Proceed with invasive evaluation using cardiac catheterization to delineate equivocal findings on the stress test. Still has continued symptoms. - Hold ELIQUIS in tonight until Friday morning precath. - Continue beta blocker.  - Pending Findings, may need to consider statin       Other Visit Diagnoses    Pre-procedure lab exam    -  Primary    Relevant Orders    Basic Metabolic Panel (BMET) (Completed)    CBC (Completed)    INR/PT (Completed)      Cardiac Cath Consent:   Procedure:  Left Heart Catheterization with  Coronary Angiography and Positive Proteins Coronary Intervention  The procedure with Risks/Benefits/Alternatives and Indications was reviewed with the patient.  All questions were answered.    Risks / Complications include, but not limited to: Death, MI, CVA/TIA, VF/VT (with defibrillation), Bradycardia (need for temporary pacer placement), contrast induced nephropathy, bleeding / bruising / hematoma / pseudoaneurysm, vascular or coronary injury (with possible emergent CT or Vascular Surgery), adverse medication reactions, infection.  Additional risks involving the use of radiation with the possibility of radiation burns and cancer were explained in detail.  The patient voices understanding and agree to proceed.     Current medicines are reviewed at length with the patient today. (+/- concerns) n/a The following changes have been made: n/a   Studies Ordered:   Orders Placed This Encounter  Procedures  . Basic Metabolic Panel (BMET)  . CBC  . INR/PT   Follow-up after catheterization   Glenetta Hew, M.D., M.S. Interventional Cardiologist   Pager # [phone] Phone # [phone] [address] [place], [place] [address]

## 2015-06-26 NOTE — Assessment & Plan Note (Signed)
Thankfully, he is not having anymore of these prolonged chest pain episodes, but still has exertional dyspnea and there is a defect noted on his stress test with reduced EF.  Options to clarify these findings on nuclear stress test are to recheck an echocardiogram to look for reduced EF or not versus invasive evaluation. After discussing the options, he would prefer to go with the more definitive evaluation of cardiac catheterization.

## 2015-06-26 NOTE — Assessment & Plan Note (Signed)
Maintain on ELIQUIS for anticoagulations. Will need to hold for cardiac catheterization.

## 2015-06-26 NOTE — Patient Instructions (Addendum)
Medication Instructions:  Your physician recommends that you continue on your current medications as directed. Please refer to the Current Medication list given to you today. **DO NOT TAKE ELIQUIS TODAY, TOMORROW OR Friday   Labwork: BMET, CBC, PT/INR  Testing/Procedures: Your physician has requested that you have a cardiac catheterization. Cardiac catheterization is used to diagnose and/or treat various heart conditions. Doctors may recommend this procedure for a number of different reasons. The most common reason is to evaluate chest pain. Chest pain can be a symptom of coronary artery disease (CAD), and cardiac catheterization can show whether plaque is narrowing or blocking your heart's arteries. This procedure is also used to evaluate the valves, as well as measure the blood flow and oxygen levels in different parts of your heart. For further information please visit [email or web address]. Please follow instruction sheet, as given.  [facility] Cardiac Cath Instructions   You are scheduled for a Cardiac Cath on Friday, June 16  Please arrive at 5:30am on the day of your procedure  Please expect a call from our [place] to pre-register you  Do not eat/drink anything after midnight  Someone will need to drive you home  It is recommended someone be with you for the first 24 hours after your procedure  Wear clothes that are easy to get on/off and wear slip on shoes if possible   Medications bring a current list of all medications with you   _xx__ Do not take these medications before your procedure: DO NOT TAKE ELIQUIS TONIGHT, TOMORROW OR Friday (the day of your procedure)   Day of your procedure: Arrive at [facility], Entrance A [phone]   The usual length of stay after your procedure is about 2 to 3 hours.  This can vary.  If you have any questions, please call our office at  [phone].  Follow-Up: Your physician recommends that you schedule a follow-up appointment with Dr. Ellyn Hack after cardiac cath   Any Other Special Instructions Will Be Listed Below (If Applicable).     If you need a refill on your cardiac medications before your next appointment, please call your pharmacy.  Angiogram An angiogram, also called angiography, is a procedure used to look at the blood vessels. In this procedure, dye is injected through a long, thin tube (catheter) into an artery. X-rays are then taken. The X-rays will show if there is a blockage or problem in a blood vessel.  LET [facility] CARE PROVIDER KNOW ABOUT:  Any allergies you have, including allergies to shellfish or contrast dye.   All medicines you are taking, including vitamins, herbs, eye drops, creams, and over-the-counter medicines.   Previous problems you or members of your family have had with the use of anesthetics.   Any blood disorders you have.   Previous surgeries you have had.  Any previous kidney problems or failure you have had.  Medical conditions you have.   Possibility of pregnancy, if this applies. RISKS AND COMPLICATIONS Generally, an angiogram is a safe procedure. However, as with any procedure, problems can occur. Possible problems include:  Injury to the blood vessels, including rupture or bleeding.  Infection or bruising at the catheter site.  Allergic reaction to the dye or contrast used.  Kidney damage from the dye or contrast used.  Blood clots that can lead to a stroke or heart attack. BEFORE THE PROCEDURE  Do not eat or drink after midnight on the night  before the procedure, or as directed by your health care provider.   Ask your health care provider if you may drink enough water to take any needed medicines the morning of the procedure.  PROCEDURE  You may be given a medicine to help you relax (sedative) before and during the procedure. This medicine is  given through an IV access tube that is inserted into one of your veins.   The area where the catheter will be inserted will be washed and shaved. This is usually done in the groin but may be done in the fold of your arm (near your elbow) or in the wrist.  A medicine will be given to numb the area where the catheter will be inserted (local anesthetic).  The catheter will be inserted with a guide wire into an artery. The catheter is guided by using a type of X-ray (fluoroscopy) to the blood vessel being examined.   Dye is then injected into the catheter, and X-rays are taken. The dye helps to show where any narrowing or blockages are located.  AFTER THE PROCEDURE   If the procedure is done through the leg, you will be kept in bed lying flat for several hours. You will be instructed to not bend or cross your legs.  The insertion site will be checked frequently.  The pulse in your feet or wrist will be checked frequently.  Additional blood tests, X-rays, and electrocardiography may be done.   You may need to stay in the hospital overnight for observation.    This information is not intended to replace advice given to you by your health care provider. Make sure you discuss any questions you have with your health care provider.   Document Released: 10/08/2004 Document Revised: 01/19/2014 Document Reviewed: 06/01/2012 Elsevier Interactive Patient Education 2016 [facility] After Refer to this sheet in the next few weeks. These instructions provide you with information about caring for yourself after your procedure. Your health care provider may also give you more specific instructions. Your treatment has been planned according to current medical practices, but problems sometimes occur. Call your health care provider if you have any problems or questions after your procedure. WHAT TO EXPECT AFTER THE PROCEDURE After your procedure, it is typical to have the  following:  Bruising at the catheter insertion site that usually fades within 1-2 weeks.  Blood collecting in the tissue (hematoma) that may be painful to the touch. It should usually decrease in size and tenderness within 1-2 weeks. HOME CARE INSTRUCTIONS  Take medicines only as directed by your health care provider.  You may shower 24-48 hours after the procedure or as directed by your health care provider. Remove the bandage (dressing) and gently wash the site with plain soap and water. Pat the area dry with a clean towel. Do not rub the site, because this may cause bleeding.  Do not take baths, swim, or use a hot tub until your health care provider approves.  Check your insertion site every day for redness, swelling, or drainage.  Do not apply powder or lotion to the site.  Do not lift over 10 lb (4.5 kg) for 5 days after your procedure or as directed by your health care provider.  Ask your health care provider when it is okay to:  Return to work or school.  Resume usual physical activities or sports.  Resume sexual activity.  Do not drive home if you are discharged the same day as the procedure.  Have someone else drive you.  You may drive 24 hours after the procedure unless otherwise instructed by your health care provider.  Do not operate machinery or power tools for 24 hours after the procedure or as directed by your health care provider.  If your procedure was done as an outpatient procedure, which means that you went home the same day as your procedure, a responsible adult should be with you for the first 24 hours after you arrive home.  Keep all follow-up visits as directed by your health care provider. This is important. SEEK MEDICAL CARE IF:  You have a fever.  You have chills.  You have increased bleeding from the catheter insertion site. Hold pressure on the site. SEEK IMMEDIATE MEDICAL CARE IF:  You have unusual pain at the catheter insertion site.  You  have redness, warmth, or swelling at the catheter insertion site.  You have drainage (other than a small amount of blood on the dressing) from the catheter insertion site.  The catheter insertion site is bleeding, and the bleeding does not stop after 30 minutes of holding steady pressure on the site.  The area near or just beyond the catheter insertion site becomes pale, cool, tingly, or numb.   This information is not intended to replace advice given to you by your health care provider. Make sure you discuss any questions you have with your health care provider.   Document Released: 07/17/2004 Document Revised: 01/19/2014 Document Reviewed: 06/01/2012 Elsevier Interactive Patient Education [employer].

## 2015-06-26 NOTE — Assessment & Plan Note (Signed)
No further recurrence. On beta blocker

## 2015-06-28 ENCOUNTER — Encounter (HOSPITAL_COMMUNITY): Payer: Self-pay | Admitting: Cardiology

## 2015-06-28 ENCOUNTER — Encounter (HOSPITAL_COMMUNITY)
Admission: RE | Disposition: A | Discharge status: Home / Self Care | Payer: Self-pay | Source: Ambulatory Visit | Attending: Cardiology

## 2015-06-28 ENCOUNTER — Ambulatory Visit (HOSPITAL_COMMUNITY)
Admission: RE | Admit: 2015-06-28 | Discharge: 2015-06-28 | Disposition: A | Discharge status: Home / Self Care | Payer: Medicare Other | Source: Ambulatory Visit | Attending: Cardiology | Admitting: Cardiology

## 2015-06-28 DIAGNOSIS — Z86711 Personal history of pulmonary embolism: Secondary | ICD-10-CM | POA: Insufficient documentation

## 2015-06-28 DIAGNOSIS — Z86010 Personal history of colonic polyps: Secondary | ICD-10-CM | POA: Insufficient documentation

## 2015-06-28 DIAGNOSIS — Z7901 Long term (current) use of anticoagulants: Secondary | ICD-10-CM | POA: Diagnosis not present

## 2015-06-28 DIAGNOSIS — Z87891 Personal history of nicotine dependence: Secondary | ICD-10-CM | POA: Diagnosis not present

## 2015-06-28 DIAGNOSIS — Z823 Family history of stroke: Secondary | ICD-10-CM | POA: Diagnosis not present

## 2015-06-28 DIAGNOSIS — R0609 Other forms of dyspnea: Secondary | ICD-10-CM

## 2015-06-28 DIAGNOSIS — R931 Abnormal findings on diagnostic imaging of heart and coronary circulation: Secondary | ICD-10-CM

## 2015-06-28 DIAGNOSIS — I2584 Coronary atherosclerosis due to calcified coronary lesion: Secondary | ICD-10-CM | POA: Diagnosis not present

## 2015-06-28 DIAGNOSIS — I2510 Atherosclerotic heart disease of native coronary artery without angina pectoris: Secondary | ICD-10-CM | POA: Diagnosis not present

## 2015-06-28 DIAGNOSIS — R9439 Abnormal result of other cardiovascular function study: Secondary | ICD-10-CM | POA: Diagnosis present

## 2015-06-28 DIAGNOSIS — R0602 Shortness of breath: Secondary | ICD-10-CM

## 2015-06-28 DIAGNOSIS — R079 Chest pain, unspecified: Secondary | ICD-10-CM | POA: Diagnosis not present

## 2015-06-28 DIAGNOSIS — K219 Gastro-esophageal reflux disease without esophagitis: Secondary | ICD-10-CM | POA: Insufficient documentation

## 2015-06-28 DIAGNOSIS — I471 Supraventricular tachycardia: Secondary | ICD-10-CM | POA: Insufficient documentation

## 2015-06-28 DIAGNOSIS — K449 Diaphragmatic hernia without obstruction or gangrene: Secondary | ICD-10-CM | POA: Insufficient documentation

## 2015-06-28 DIAGNOSIS — R0789 Other chest pain: Secondary | ICD-10-CM | POA: Diagnosis present

## 2015-06-28 DIAGNOSIS — Z7982 Long term (current) use of aspirin: Secondary | ICD-10-CM | POA: Insufficient documentation

## 2015-06-28 DIAGNOSIS — K589 Irritable bowel syndrome without diarrhea: Secondary | ICD-10-CM | POA: Insufficient documentation

## 2015-06-28 DIAGNOSIS — Z8249 Family history of ischemic heart disease and other diseases of the circulatory system: Secondary | ICD-10-CM | POA: Diagnosis not present

## 2015-06-28 HISTORY — PX: CARDIAC CATHETERIZATION: SHX172

## 2015-06-28 LAB — POCT I-STAT 3, VENOUS BLOOD GAS (G3P V)
ACID-BASE DEFICIT: 3 mmol/L — AB (ref 0.0–2.0)
Acid-base deficit: 3 mmol/L — ABNORMAL HIGH (ref 0.0–2.0)
BICARBONATE: 24.1 meq/L — AB (ref 20.0–24.0)
BICARBONATE: 24.3 meq/L — AB (ref 20.0–24.0)
O2 SAT: 69 %
O2 SAT: 69 %
PCO2 VEN: 51.4 mmHg — AB (ref 45.0–50.0)
PH VEN: 7.301 — AB (ref 7.250–7.300)
PO2 VEN: 41 mmHg (ref 31.0–45.0)
TCO2: 26 mmol/L (ref 0–100)
TCO2: 26 mmol/L (ref 0–100)
pCO2, Ven: 49.4 mmHg (ref 45.0–50.0)
pH, Ven: 7.278 (ref 7.250–7.300)
pO2, Ven: 40 mmHg (ref 31.0–45.0)

## 2015-06-28 LAB — POCT I-STAT 3, ART BLOOD GAS (G3+)
ACID-BASE DEFICIT: 3 mmol/L — AB (ref 0.0–2.0)
BICARBONATE: 22.5 meq/L (ref 20.0–24.0)
O2 SAT: 97 %
PO2 ART: 102 mmHg — AB (ref 80.0–100.0)
TCO2: 24 mmol/L (ref 0–100)
pCO2 arterial: 42.2 mmHg (ref 35.0–45.0)
pH, Arterial: 7.334 — ABNORMAL LOW (ref 7.350–7.450)

## 2015-06-28 LAB — POCT ACTIVATED CLOTTING TIME: ACTIVATED CLOTTING TIME: 186 s

## 2015-06-28 SURGERY — LEFT HEART CATH AND CORONARY ANGIOGRAPHY

## 2015-06-28 MED ORDER — VERAPAMIL HCL 2.5 MG/ML IV SOLN
INTRAVENOUS | Status: DC | PRN
  Administered 2015-06-28: 10 mL via INTRA_ARTERIAL

## 2015-06-28 MED ORDER — SODIUM CHLORIDE 0.9% FLUSH: 3.0000 mL | INTRAVENOUS | Status: DC | PRN

## 2015-06-28 MED ORDER — SODIUM CHLORIDE 0.9 % IV SOLN: 250.0000 mL | INTRAVENOUS | Status: DC | PRN

## 2015-06-28 MED ORDER — SODIUM CHLORIDE 0.9% FLUSH: 3.0000 mL | Freq: Two times a day (BID) | INTRAVENOUS | Status: DC

## 2015-06-28 MED ORDER — HEPARIN (PORCINE) IN NACL 2-0.9 UNIT/ML-% IJ SOLN
INTRAMUSCULAR | Status: DC | PRN
Start: 2015-06-28 — End: 2015-06-28
  Administered 2015-06-28: 1000 mL

## 2015-06-28 MED ORDER — IOPAMIDOL (ISOVUE-370) INJECTION 76%
INTRAVENOUS | Status: AC
  Filled 2015-06-28: qty 100

## 2015-06-28 MED ORDER — VERAPAMIL HCL 2.5 MG/ML IV SOLN
INTRAVENOUS | Status: AC
  Filled 2015-06-28: qty 2

## 2015-06-28 MED ORDER — HEPARIN SODIUM (PORCINE) 1000 UNIT/ML IJ SOLN
INTRAMUSCULAR | Status: AC
  Filled 2015-06-28: qty 1

## 2015-06-28 MED ORDER — ASPIRIN 81 MG PO CHEW
81.0000 mg | CHEWABLE_TABLET | ORAL | Status: AC
  Administered 2015-06-28: 81 mg via ORAL

## 2015-06-28 MED ORDER — HEPARIN (PORCINE) IN NACL 2-0.9 UNIT/ML-% IJ SOLN
INTRAMUSCULAR | Status: AC
  Filled 2015-06-28: qty 1000

## 2015-06-28 MED ORDER — IOPAMIDOL (ISOVUE-370) INJECTION 76%
INTRAVENOUS | Status: DC | PRN
  Administered 2015-06-28: 80 mL via INTRAVENOUS

## 2015-06-28 MED ORDER — ASPIRIN 81 MG PO CHEW
CHEWABLE_TABLET | ORAL | Status: AC
  Filled 2015-06-28: qty 1

## 2015-06-28 MED ORDER — SODIUM CHLORIDE 0.9 % WEIGHT BASED INFUSION: 1.0000 mL/kg/h | INTRAVENOUS | Status: DC

## 2015-06-28 MED ORDER — HEPARIN SODIUM (PORCINE) 1000 UNIT/ML IJ SOLN
INTRAMUSCULAR | Status: DC | PRN
  Administered 2015-06-28: 5000 [IU] via INTRAVENOUS

## 2015-06-28 MED ORDER — LIDOCAINE HCL (PF) 1 % IJ SOLN
INTRAMUSCULAR | Status: AC
  Filled 2015-06-28: qty 30

## 2015-06-28 MED ORDER — MIDAZOLAM HCL 2 MG/2ML IJ SOLN
INTRAMUSCULAR | Status: DC | PRN
  Administered 2015-06-28: 1 mg via INTRAVENOUS

## 2015-06-28 MED ORDER — LIDOCAINE HCL (PF) 1 % IJ SOLN
INTRAMUSCULAR | Status: DC | PRN
  Administered 2015-06-28: 15 mL via INTRADERMAL
  Administered 2015-06-28: 3 mL via INTRADERMAL

## 2015-06-28 MED ORDER — SODIUM CHLORIDE 0.9 % WEIGHT BASED INFUSION: 1.0000 mL/kg/h | INTRAVENOUS | Status: AC

## 2015-06-28 MED ORDER — ONDANSETRON HCL 4 MG/2ML IJ SOLN: 4.0000 mg | Freq: Four times a day (QID) | INTRAMUSCULAR | Status: DC | PRN

## 2015-06-28 MED ORDER — FENTANYL CITRATE (PF) 100 MCG/2ML IJ SOLN
INTRAMUSCULAR | Status: AC
  Filled 2015-06-28: qty 2

## 2015-06-28 MED ORDER — ACETAMINOPHEN 325 MG PO TABS: 650.0000 mg | ORAL_TABLET | ORAL | Status: DC | PRN

## 2015-06-28 MED ORDER — FENTANYL CITRATE (PF) 100 MCG/2ML IJ SOLN
INTRAMUSCULAR | Status: DC | PRN
  Administered 2015-06-28: 25 ug via INTRAVENOUS

## 2015-06-28 MED ORDER — MIDAZOLAM HCL 2 MG/2ML IJ SOLN
INTRAMUSCULAR | Status: AC
  Filled 2015-06-28: qty 2

## 2015-06-28 MED ORDER — SODIUM CHLORIDE 0.9 % WEIGHT BASED INFUSION
3.0000 mL/kg/h | INTRAVENOUS | Status: AC
  Administered 2015-06-28: 3 mL/kg/h via INTRAVENOUS

## 2015-06-28 SURGICAL SUPPLY — 13 items
CATH INFINITI 5FR ANG PIGTAIL (CATHETERS) ×4 IMPLANT
CATH OPTITORQUE TIG 4.0 5F (CATHETERS) ×4 IMPLANT
CATH SWAN GANZ 7F STRAIGHT (CATHETERS) ×4 IMPLANT
DEVICE RAD COMP TR BAND LRG (VASCULAR PRODUCTS) ×4 IMPLANT
GLIDESHEATH SLEND A-KIT 6F 22G (SHEATH) ×4 IMPLANT
KIT HEART LEFT (KITS) ×4 IMPLANT
PACK CARDIAC CATHETERIZATION (CUSTOM PROCEDURE TRAY) ×4 IMPLANT
SHEATH PINNACLE 7F 10CM (SHEATH) ×4 IMPLANT
SYR MEDRAD MARK V 150ML (SYRINGE) ×4 IMPLANT
TRANSDUCER W/STOPCOCK (MISCELLANEOUS) ×4 IMPLANT
TUBING CIL FLEX 10 FLL-RA (TUBING) ×4 IMPLANT
WIRE EMERALD 3MM-J .025X260CM (WIRE) ×4 IMPLANT
WIRE SAFE-T 1.5MM-J .035X260CM (WIRE) ×4 IMPLANT

## 2015-06-28 NOTE — Progress Notes (Signed)
Site area: rt groin Site Prior to Removal:  Level 0 Pressure Applied For:  15 minutes Manual:   yes Patient Status During Pull:  stable Post Pull Site:  Level  0 Post Pull Instructions Given:  yes Post Pull Pulses Present: yes Dressing Applied:  Small tegaderm Bedrest begins @  0915 Comments:

## 2015-06-28 NOTE — Interval H&P Note (Signed)
History and Physical Interval Note:  06/28/2015 7:19 AM  Jordan Figueroa  has presented today for surgery, with the diagnosis of abnormal stress test with persistent exertional dyspnea.  The various methods of treatment have been discussed with the patient and family. After consideration of risks, benefits and other options for treatment, the patient has consented to  Procedure(s): Left Heart Cath and Coronary Angiography (N/A) with Possible Percutaneous Coronary Intervention as a surgical intervention .  The patient's history has been reviewed, patient examined, no change in status, stable for surgery.  I have reviewed the patient's chart and labs.  Questions were answered to the patient's satisfaction.    AUC FOR CATH: CAD Assessment (Coronary Angiography With or Without Left Heart Catheterization and/or Left Ventriculography)  Patient Information:   Suspected CAD (No Prior PCI, No Prior CABG, and No Prior Angiogram Showing >= 50% Angiographic Stenosis)   Prior Noninvasive Testing: Stress Test With Imaging (SPECT MPI, Stress Echocardiography, Stress PET, Stress CMR)   Low-risk findings (e.g., 5% ischemic myocardium on stress SPECT MPI or stress PET, no stress-induced wall motion abnormalities on stress echo or stress CMR)   Pretest Symptom Status: Symptomatic  AUC Score:   U (4)   Indication:   15 CAD Assessment (Coronary Angiography With or Without Left Heart Catheterization and/or Left Ventriculography)  Patient Information:    Suspected CAD (No Prior PCI, No Prior CABG, and No Prior Angiogram Showing >= 50% Angiographic Stenosis)   Prior Noninvasive Testing: Stress Test With Imaging (SPECT MPI, Stress Echocardiography, Stress PET, Stress CMR)   Equivocal/uninterpretable findings (e.g., perfusion defect vs. attenuation artifact, uninterpretable stress imaging)   Pretest Symptom Status: Symptomatic  AUC Score:   A (7)   Indication:   21 CAD Assessment (Coronary Angiography With or  Without Left Heart Catheterization and/or Left Ventriculography)  Patient Information:    Suspected CAD (No Prior PCI, No Prior CABG, and No Prior Angiogram Showing >= 50% Angiographic Stenosis)   Prior Noninvasive Testing: Stress Test With Imaging (SPECT MPI, Stress Echocardiography, Stress PET, Stress CMR)   Fixed perfusion defect on SPECT MPI or a persistent wall motion abnormality on stress echo consistent with infarction without significant ischemia (<5% ischemic myocardium)   Pretest Symptom Status: Symptomatic  AUC Score:   U (6)   Indication:   22  AUC FOR PCI IF CAD FOUND: Consider Sx as Class III Ischemic Symptoms? CCS III (Marked limitation of ordinary activity) Anti-ischemic Medical Therapy? Minimal Therapy (1 class of medications) Non-invasive Test Results? Low-risk stress test findings: cardiac mortality <1%/year Prior CABG? No Previous CABG   Patient Information:   1-2V CAD, no prox LAD  U (5)  Indication: 14; Score: 5   Patient Information:   CTO of 1 vessel, no other CAD  I (3)  Indication: 24; Score: 3   Patient Information:   1V CAD with prox LAD  A (7)  Indication: 30; Score: 7   Patient Information:   2V-CAD with prox LAD  A (7)  Indication: 36; Score: 7   Patient Information:   3V-CAD without LMCA  A (7)  Indication: 42; Score: 7   Patient Information:   3V-CAD without LMCA With Abnormal LV systolic function  A (9)  Indication: 48; Score: 9   Patient Information:   LMCA-CAD  A (9)  Indication: 49; Score: 9   Patient Information:   2V-CAD with prox LAD PCI  A (7)  Indication: 62; Score: 7   Patient Information:   2V-CAD with  prox LAD CABG  A (8)  Indication: 62; Score: 8   Patient Information:   3V-CAD without LMCA With Low CAD burden(i.e., 3 focal stenoses, low SYNTAX score) PCI  A (7)  Indication: 63; Score: 7   Patient Information:   3V-CAD without LMCA With Low CAD burden(i.e., 3 focal stenoses,  low SYNTAX score) CABG  A (9)  Indication: 63; Score: 9   Patient Information:   3V-CAD without LMCA E06c - Intermediate-high CAD burden (i.e., multiple diffuse lesions, presence of CTO, or high SYNTAX score) PCI  U (4)  Indication: 64; Score: 4   Patient Information:   3V-CAD without LMCA E06c - Intermediate-high CAD burden (i.e., multiple diffuse lesions, presence of CTO, or high SYNTAX score) CABG  A (9)  Indication: 64; Score: 9   Patient Information:   LMCA-CAD With Isolated LMCA stenosis  PCI  U (6)  Indication: 65; Score: 6   Patient Information:   LMCA-CAD With Isolated LMCA stenosis  CABG  A (9)  Indication: 65; Score: 9   Patient Information:   LMCA-CAD Additional CAD, low CAD burden (i.e., 1- to 2-vessel additional involvement, low SYNTAX score) PCI  U (5)  Indication: 66; Score: 5   Patient Information:   LMCA-CAD Additional CAD, low CAD burden (i.e., 1- to 2-vessel additional involvement, low SYNTAX score) CABG  A (9)  Indication: 66; Score: 9   Patient Information:   LMCA-CAD Additional CAD, intermediate-high CAD burden (i.e., 3-vessel involvement, presence of CTO, or high SYNTAX score) PCI  I (3)  Indication: 67; Score: 3   Patient Information:   LMCA-CAD Additional CAD, intermediate-high CAD burden (i.e., 3-vessel involvement, presence of CTO, or high SYNTAX score) CABG  A (9)  Indication: 67; Score: 9    Cath Lab Visit (complete for each Cath Lab visit)  Clinical Evaluation Leading to the Procedure:   ACS: No.  Non-ACS:    Anginal Classification: CCS III  Anti-ischemic medical therapy: Minimal Therapy (1 class of medications)  Non-Invasive Test Results: Low-risk stress test findings: cardiac mortality <1%/year  Prior CABG: No previous CABG   Glenetta Hew

## 2015-06-28 NOTE — Discharge Instructions (Signed)
Radial Site Care °Refer to this sheet in the next few weeks. These instructions provide you with information about caring for yourself after your procedure. Your health care provider may also give you more specific instructions. Your treatment has been planned according to current medical practices, but problems sometimes occur. Call your health care provider if you have any problems or questions after your procedure. °WHAT TO EXPECT AFTER THE PROCEDURE °After your procedure, it is typical to have the following: °· Bruising at the radial site that usually fades within 1-2 weeks. °· Blood collecting in the tissue (hematoma) that may be painful to the touch. It should usually decrease in size and tenderness within 1-2 weeks. °HOME CARE INSTRUCTIONS °· Take medicines only as directed by your health care provider. °· You may shower 24-48 hours after the procedure or as directed by your health care provider. Remove the bandage (dressing) and gently wash the site with plain soap and water. Pat the area dry with a clean towel. Do not rub the site, because this may cause bleeding. °· Do not take baths, swim, or use a hot tub until your health care provider approves. °· Check your insertion site every day for redness, swelling, or drainage. °· Do not apply powder or lotion to the site. °· Do not flex or bend the affected arm for 24 hours or as directed by your health care provider. °· Do not push or pull heavy objects with the affected arm for 24 hours or as directed by your health care provider. °· Do not lift over 10 lb (4.5 kg) for 5 days after your procedure or as directed by your health care provider. °· Ask your health care provider when it is okay to: °¨ Return to work or school. °¨ Resume usual physical activities or sports. °¨ Resume sexual activity. °· Do not drive home if you are discharged the same day as the procedure. Have someone else drive you. °· You may drive 24 hours after the procedure unless otherwise  instructed by your health care provider. °· Do not operate machinery or power tools for 24 hours after the procedure. °· If your procedure was done as an outpatient procedure, which means that you went home the same day as your procedure, a responsible adult should be with you for the first 24 hours after you arrive home. °· Keep all follow-up visits as directed by your health care provider. This is important. °SEEK MEDICAL CARE IF: °· You have a fever. °· You have chills. °· You have increased bleeding from the radial site. Hold pressure on the site. °SEEK IMMEDIATE MEDICAL CARE IF: °· You have unusual pain at the radial site. °· You have redness, warmth, or swelling at the radial site. °· You have drainage (other than a small amount of blood on the dressing) from the radial site. °· The radial site is bleeding, and the bleeding does not stop after 30 minutes of holding steady pressure on the site. °· Your arm or hand becomes pale, cool, tingly, or numb. °  °This information is not intended to replace advice given to you by your health care provider. Make sure you discuss any questions you have with your health care provider. °  °Document Released: 01/31/2010 Document Revised: 01/19/2014 Document Reviewed: 07/17/2013 °Elsevier Interactive Patient Education ©2016 [employer] ° °

## 2015-06-28 NOTE — Interval H&P Note (Signed)
History and Physical Interval Note:  06/28/2015 8:44 AM  Jordan Figueroa  has presented today for surgery, with the diagnosis of abnormal stress test  The various methods of treatment have been discussed with the patient and family. After consideration of risks, benefits and other options for treatment, the patient has consented to  Procedure(s): Left Heart Cath and Coronary Angiography (N/A) as a surgical intervention .  The patient's history has been reviewed, patient examined, no change in status, stable for surgery.  I have reviewed the patient's chart and labs.  Questions were answered to the patient's satisfaction.     His symptoms have been mostly exertional dyspnea with a history of TEE, we talked briefly him this morning and decided that if the left heart catheterization was not revealing, but I would also proceed with right heart catheterization. Consent was obtained for right heart cath as well.  Glenetta Hew

## 2015-06-28 NOTE — H&P (View-Only) (Signed)
PCP: Jordan Figueroa., MD  Clinic Note: Chief Complaint  Patient presents with  . Follow-up    SOB with & without activity, per pt  . Shortness of Breath    HPI: Jordan Figueroa is a 82 y.o. male with a PMH below who presents today for One-month follow-up for PSVT and chest pain/dyspnea.Marland Kitchen  Jordan Figueroa was last seen on 06/06/2015 for chest pain and exertional dyspnea. I first saw him when he was hospitalized for influenza and was noted to have SVT.  Recent Hospitalizations: None  Studies Reviewed:    Myoview: Exercise myocardial perfusion imaging study with no significant ischemia Normal wall motion, EF estimated at 40% (depressed ejection fraction possibly secondary to mild GI uptake artifact) Small region of fixed apical thinning, likely secondary to attenuation artifact. No wall motion in this region No EKG changes concerning for ischemia at peak stress or in recovery. Rare PVCs noted Target heart rate achieved, exercised for 4 minutes, achieved 4.6 METS Low risk scan  Interval History: Jordan Figueroa presents today, basically stating that he still has pretty significant exertional dyspnea that is deathly different than his baseline. He can previously walk several miles without having dyspnea, now he cannot walk maybe half a mile without having to stop catch his breath. He has not had any more of the "elephant on his chest "chest heaviness with rest or exertion, but did have a couple episodes prior to his stress test. He thinks he may not be doing as much activity as he was at that time, he deathly states that something is "off for him ". He is quite concerned, and was not overly thrilled with the results of his stress test with a "artifact" that is difficult to explain.   No PND, orthopnea. Mild pedal edema  No palpitations, lightheadedness, dizziness, weakness or syncope/near syncope. No TIA/amaurosis fugax symptoms. No melena, hematochezia, hematuria, or epstaxis. No  claudication.  ROS: A comprehensive was performed. Review of Systems  Constitutional: Positive for malaise/fatigue (He definitely notes decreased exercise tolerance). Negative for fever and chills.  Eyes: Negative for blurred vision.  Respiratory: Positive for shortness of breath (Exertional).   Cardiovascular:       Per history of present illness  Gastrointestinal: Negative for heartburn and abdominal pain.  Musculoskeletal: Negative.   Neurological: Negative for loss of consciousness and headaches.  Endo/Heme/Allergies: Does not bruise/bleed easily.  Psychiatric/Behavioral: Negative for depression and memory loss. The patient is not nervous/anxious and does not have insomnia.   All other systems reviewed and are negative.   Past Medical History  Diagnosis Date  . IBS (irritable bowel syndrome)   . Colon polyps   . GERD (gastroesophageal reflux disease)   . Hiatal hernia   . Hemorrhoids   . Pulmonary embolism ([place])   . Cancer ([place])     skin  . Paroxysmal supraventricular tachycardia [name]) March 2017    First documented during episode of influenza    Past Surgical History  Procedure Laterality Date  . Colon surgery  07/14/2013    small bowel obstruction  . Skin surgery    . Transthoracic echocardiogram  04/10/2015    EF 60-65%. No RWMA, GR 1 DD. Mild PA pressure elevation of 38 mmHg.   Prior to Admission medications   Medication Sig Start Date End Date Taking? Authorizing Provider  apixaban (ELIQUIS) 5 MG TABS tablet Take 1 tablet (5 mg total) by mouth 2 (two) times daily. 05/07/15  Yes Leonie Man, MD  aspirin EC 81  MG tablet Take 1 tablet (81 mg total) by mouth daily. 06/05/15  Yes Leonie Man, MD  metoprolol tartrate (LOPRESSOR) 25 MG tablet Take 0.5 tablets (12.5 mg total) by mouth 2 (two) times daily. 06/03/15  Yes Leonie Man, MD  nitroGLYCERIN (NITROSTAT) 0.4 MG SL tablet Place 1 tablet (0.4 mg total) under the tongue every 5 (five) minutes as needed for  chest pain. 06/05/15  Yes Leonie Man, MD   No Known Allergies   Social History   Social History  . Marital Status: Widowed    Spouse Name: N/A  . Number of Children: N/A  . Years of Education: N/A   Social History Main Topics  . Smoking status: Former Smoker -- 2.00 packs/day for 25 years    Types: Cigarettes    Quit date: 02/25/1973  . Smokeless tobacco: Never Used  . Alcohol Use: No  . Drug Use: No  . Sexual Activity: Not Asked   Other Topics Concern  . None   Social History Narrative    Family History  Problem Relation Age of Onset  . CVA Father   . Alzheimer's disease Mother   . Heart Problems Sister   . Heart attack Brother      Wt Readings from Last 3 Encounters:  06/26/15 223 lb 12.8 oz (101.515 kg)  06/05/15 224 lb 12 oz (101.946 kg)  04/17/15 223 lb 8 oz (101.379 kg)    PHYSICAL EXAM BP 122/60 mmHg  Pulse 56  Ht 5\' 11"  (1.803 m)  Wt 223 lb 12.8 oz (101.515 kg)  BMI 31.23 kg/m2  SpO2 92% General appearance: alert, cooperative, appears stated age, no distress and Mildly obese HEENT: [place]/AT, EOMI, MMM, anicteric sclera Neck: no adenopathy, no carotid bruit and no JVD Lungs: clear to auscultation bilaterally, normal percussion bilaterally and non-labored Heart: regular rate and rhythm, S1, S2 normal, no murmur, click, rub or gallop; nondisplaced PMI Abdomen: soft, non-tender; bowel sounds normal; no masses, no organomegaly; no HJR Extremities: extremities normal, atraumatic, no cyanosis, or edema Pulses: 2+ and symmetric;  Skin: mobility and turgor normal, no edema and no lesions noted  Neurologic: Mental status: Alert, oriented, thought content appropriate Cranial nerves: normal (II-XII grossly intact)    Adult ECG Report n/a   Other studies Reviewed: Additional studies/ records that were reviewed today include:  Recent Labs:  Pending pre-cath labs     ASSESSMENT / PLAN: Problem List Items Addressed This Visit    Paroxysmal SVT  (supraventricular tachycardia) (HCC) (Chronic)    No further recurrence. On beta blocker      Exertional shortness of breath    States this is definitely different brand.  Findings concerned about reduced EF noted on Myoview nuclear inferior defect noted after prolonged episodes of pain and persistent dyspnea on exertion.  Plan: Cardiac catheterization plus minus PCI.      Chest pain with moderate risk for cardiac etiology    Thankfully, he is not having anymore of these prolonged chest pain episodes, but still has exertional dyspnea and there is a defect noted on his stress test with reduced EF.  Options to clarify these findings on nuclear stress test are to recheck an echocardiogram to look for reduced EF or not versus invasive evaluation. After discussing the options, he would prefer to go with the more definitive evaluation of cardiac catheterization.      Abnormal nuclear stress test    Stress test is read as low risk, but there is definitely an  inferior defect that I see. No wall motion abnormality was noted on previous echo, or potentially this echo, but the EF is read as 40% which is deathly lower than the echocardiogram showed the past. He is still symptomatic, and seeking closure. At this point, I cannot exclude that the inferior defect is in fact a recent MI that replaced one of his episodes of chest discomfort he had in the last few weeks.  Plan: Proceed with invasive evaluation using cardiac catheterization to delineate equivocal findings on the stress test. Still has continued symptoms. - Hold ELIQUIS in tonight until Friday morning precath. - Continue beta blocker.  - Pending Findings, may need to consider statin       Other Visit Diagnoses    Pre-procedure lab exam    -  Primary    Relevant Orders    Basic Metabolic Panel (BMET) (Completed)    CBC (Completed)    INR/PT (Completed)      Cardiac Cath Consent:   Procedure:  Left Heart Catheterization with  Coronary Angiography and Positive Proteins Coronary Intervention  The procedure with Risks/Benefits/Alternatives and Indications was reviewed with the patient.  All questions were answered.    Risks / Complications include, but not limited to: Death, MI, CVA/TIA, VF/VT (with defibrillation), Bradycardia (need for temporary pacer placement), contrast induced nephropathy, bleeding / bruising / hematoma / pseudoaneurysm, vascular or coronary injury (with possible emergent CT or Vascular Surgery), adverse medication reactions, infection.  Additional risks involving the use of radiation with the possibility of radiation burns and cancer were explained in detail.  The patient voices understanding and agree to proceed.     Current medicines are reviewed at length with the patient today. (+/- concerns) n/a The following changes have been made: n/a   Studies Ordered:   Orders Placed This Encounter  Procedures  . Basic Metabolic Panel (BMET)  . CBC  . INR/PT   Follow-up after catheterization   Glenetta Hew, M.D., M.S. Interventional Cardiologist   Pager # [phone] Phone # [phone] [address] [place], [place] [address]

## 2015-07-03 ENCOUNTER — Encounter: Payer: Self-pay | Admitting: Cardiology

## 2015-07-03 ENCOUNTER — Ambulatory Visit (INDEPENDENT_AMBULATORY_CARE_PROVIDER_SITE_OTHER): Payer: Medicare Other | Admitting: Cardiology

## 2015-07-03 VITALS — BP 130/74 | HR 67 | Ht 71.0 in | Wt 226.5 lb

## 2015-07-03 DIAGNOSIS — I5032 Chronic diastolic (congestive) heart failure: Secondary | ICD-10-CM

## 2015-07-03 DIAGNOSIS — I471 Supraventricular tachycardia: Secondary | ICD-10-CM | POA: Diagnosis not present

## 2015-07-03 DIAGNOSIS — I2699 Other pulmonary embolism without acute cor pulmonale: Secondary | ICD-10-CM

## 2015-07-03 DIAGNOSIS — R079 Chest pain, unspecified: Secondary | ICD-10-CM | POA: Diagnosis not present

## 2015-07-03 DIAGNOSIS — R931 Abnormal findings on diagnostic imaging of heart and coronary circulation: Secondary | ICD-10-CM

## 2015-07-03 DIAGNOSIS — R9439 Abnormal result of other cardiovascular function study: Secondary | ICD-10-CM

## 2015-07-03 DIAGNOSIS — R0602 Shortness of breath: Secondary | ICD-10-CM

## 2015-07-03 MED ORDER — FUROSEMIDE 20 MG PO TABS: 20.0000 mg | ORAL_TABLET | Freq: Every day | ORAL | Status: DC

## 2015-07-03 NOTE — Patient Instructions (Signed)
Medication Instructions:  Your physician has recommended you make the following change in your medication:  START taking lasix tomorrow. For two days, take 40mg  (2 tablets) once daily then take 20mg  (1 tablet) daily   Labwork: none  Testing/Procedures: none  Follow-Up: Your physician recommends that you schedule a follow-up appointment in: two months with Dr. Ellyn Hack.    Any Other Special Instructions Will Be Listed Below (If Applicable).     If you need a refill on your cardiac medications before your next appointment, please call your pharmacy.

## 2015-07-03 NOTE — Progress Notes (Signed)
PCP: Kirk Ruths., MD  Clinic Note: Chief Complaint  Patient presents with  . Follow-up    Post cath  . Shortness of Breath  . Tachycardia    SVT    HPI: Jordan Figueroa is a 82 y.o. male with a PMH below who presents today for post-cath follow-up. He is a pleasant gentleman who I first met when he presented with influenza complicated by SVT. He then developed some chest tightness or pressure symptoms as well as dyspnea following episode. We evaluated him with a stress test that was suboptimal as far as exercise 4.6 METS and inferior defect that was thought to potentially get attenuation.  Dequan Kindred was last seen on follow-up of stress test that showed possible inferior defect. This was for exertional dyspnea that seems to be worsening. The only concerning thing about the 90 was the EF was estimated as 40%as opposed to most recent echo that was normal.  we did a right heart catheterization on June 16. This showed relatively normal right heart pressures minimally reduced cardiac output and minimal CAD.  Recent Hospitalizations: for catheterization   Studies Reviewed:  Procedures 06/28/2015    Left Heart Cath and Coronary Angiography   Right Heart Cath    Conclusion : Angiographically minimal coronary disease with preserved ejection fraction, mildly reduced cardiac output/index. Mostly normal right heart Pressures with the exception of mildly elevated PCWP and LVEDP     RCA - prox & mid-distal focal 20-25% lesions  Mild diffuse calcification in the left main and proximal LAD system.  Prox LAD lesion, 30% stenosed.  Normal EF with mildly reduced cardiac output of 4.74  Mildly elevated LVEDP - 18 mmHg, with high normal wedge pressure - 12 mmHg.  Very little data from this evaluation that could explain the patient's exertional dyspnea.     Interval History: [name] comes in today still noticing that he feels short of breath. He was only able to walk about a mile in  the mall yesterday. He still doesn't have any chest tightness or pressure since that one episode. Mild lower TIMI edema that he has not noted before, but has not noted PND or orthopnea. When he gets short of breath he feels little lightheaded, but otherwise no syncope or near-syncope, TIA or amaurosis fugax symptoms. No sensation of rapid irregular heartbeats or palpitations to suggest recurrence of SVT. He continues to take his Eliquis and denies any melena, hematochezia or hematuria. No epistaxis.  ROS: A comprehensive was performed. Review of Systems  Constitutional: Negative for malaise/fatigue (Decreased exercise tolerance).  Respiratory: Positive for shortness of breath (Only exertional). Negative for wheezing.   Cardiovascular: Positive for leg swelling. Negative for claudication.       Per history of present illness  Gastrointestinal: Positive for nausea (Occasionally, but not associated with anything in particular.). Negative for constipation.  Genitourinary: Negative.   Musculoskeletal: Positive for joint pain (Arthritis). Negative for myalgias.  Neurological: Positive for dizziness (When he gets dyspneic).  Endo/Heme/Allergies: Bruises/bleeds easily.  Psychiatric/Behavioral: Negative.  Negative for memory loss. The patient does not have insomnia.   All other systems reviewed and are negative.   Past Medical History  Diagnosis Date  . IBS (irritable bowel syndrome)   . Colon polyps   . GERD (gastroesophageal reflux disease)   . Hiatal hernia   . Hemorrhoids   . Pulmonary embolism ([place])   . Cancer ([place])     skin  . Paroxysmal supraventricular tachycardia [facility]) March 2017  First documented during episode of influenza  . Coronary artery disease excluded 06/28/15    Minimal CAD on Cardiac Cath    Past Surgical History  Procedure Laterality Date  . Colon surgery  07/14/2013    small bowel obstruction  . Skin surgery    . Transthoracic echocardiogram  04/10/2015    EF  60-65%. No RWMA, GR 1 DD. Mild PA pressure elevation of 38 mmHg.  . Cardiac catheterization N/A 06/28/2015    Procedure: Left Heart Cath and Coronary Angiography;  Surgeon: Leonie Man, MD;  Location: [place] [facility];  Service: Cardiovascular;  Laterality: N/A;; proximal and mid RCA focal 20-25% lesions. Mild diffuse calcifications in the left main and proximal LAD ~30%, LVEDP 18 mmHg  . Cardiac catheterization  06/28/2015    Procedure: Right Heart Cath;  Surgeon: Leonie Man, MD;  Location: [place] [facility];  Service: Cardiovascular;; essentially normal right heart cath pressures. Cardiac Output 4.74.Normal wedge pressure roughly 12 mmHg.    Prior to Admission medications   Medication Sig Start Date End Date Taking? Authorizing Provider  apixaban (ELIQUIS) 5 MG TABS tablet Take 1 tablet (5 mg total) by mouth 2 (two) times daily. 05/07/15  Yes Leonie Man, MD  aspirin EC 81 MG tablet Take 1 tablet (81 mg total) by mouth daily. 06/05/15  Yes Leonie Man, MD  metoprolol tartrate (LOPRESSOR) 25 MG tablet Take 0.5 tablets (12.5 mg total) by mouth 2 (two) times daily. 06/03/15  Yes Leonie Man, MD  nitroGLYCERIN (NITROSTAT) 0.4 MG SL tablet Place 1 tablet (0.4 mg total) under the tongue every 5 (five) minutes as needed for chest pain. 06/05/15  Yes Leonie Man, MD     No Known Allergies   Social History   Social History  . Marital Status: Widowed    Spouse Name: N/A  . Number of Children: N/A  . Years of Education: N/A   Social History Main Topics  . Smoking status: Former Smoker -- 2.00 packs/day for 25 years    Types: Cigarettes    Quit date: 02/25/1973  . Smokeless tobacco: Never Used  . Alcohol Use: No  . Drug Use: No  . Sexual Activity: Not Asked   Other Topics Concern  . None   Social History Narrative   Family History  Problem Relation Age of Onset  . CVA Father   . Alzheimer's disease Mother   . Heart Problems Sister   . Heart attack Brother        Wt Readings from Last 3 Encounters:  07/03/15 226 lb 8 oz (102.74 kg)  06/28/15 223 lb (101.152 kg)  06/26/15 223 lb 12.8 oz (101.515 kg)    PHYSICAL EXAM BP 130/74 mmHg  Pulse 67  Ht '5\' 11"'  (1.803 m)  Wt 226 lb 8 oz (102.74 kg)  BMI 31.60 kg/m2 General appearance: alert, cooperative, appears stated age, no distress and Mildly obese HEENT: [place]/AT, EOMI, MMM, anicteric sclera Neck: no adenopathy, no carotid bruit and no JVD Lungs: clear to auscultation bilaterally, normal percussion bilaterally and non-labored Heart: regular rate and rhythm, S1, S2 normal, no murmur, click, rub or gallop; nondisplaced PMI Abdomen: soft, non-tender; bowel sounds normal; no masses, no organomegaly; no HJR Extremities: extremities normal, atraumatic, no cyanosis, or edema; right radial cath site has a small amount of bruising. Apparently his femoral cath site has extensive bruising despite it being a venous line. Pulses: 2+ and symmetric;  Skin: mobility and turgor normal, no  edema and no lesions noted  Neurologic: Mental status: Alert, oriented, thought content appropriate Cranial nerves: normal (II-XII grossly intact)    Adult ECG Report  Rate: 67 ;  Rhythm: normal sinus rhythm and 1 AVB (PR to 50), left axis deviation (-35). Otherwise normal durations and voltage.;   Narrative Interpretation: Stable EKG   Other studies Reviewed: Additional studies/ records that were reviewed today include:  Recent Labs:  n/a   ASSESSMENT / PLAN: Problem List Items Addressed This Visit    Pulmonary embolus ([place]) (Chronic)    This could be one other reason why he is dyspneic and having recovered from his bout with influenza, he still has the pulmonary embolus that could be leading to some exertional dyspnea. Remains on apixaban without major bleeding.      Relevant Medications   furosemide (LASIX) 20 MG tablet   Paroxysmal SVT (supraventricular tachycardia) (HCC) - Primary (Chronic)    No recurrence. On  low-dose beta blocker.      Relevant Medications   furosemide (LASIX) 20 MG tablet   Other Relevant Orders   EKG 12-Lead   Exertional shortness of breath    Pretty much excluded coronary disease. He did have a some evidence of diastolic dysfunction by catheterization numbers with elevated LVEDP and wedge pressure. He is now also complaining of limited edema. Plan: We'll start low-dose Lasix with double up dosing the first 2 days and then once daily after that. We then discussed sliding scale Lasix. We'll monitor his blood pressure and potentially add afterload reduction if possible in the future.      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 Unlikely to have been anginal in nature if not later spasm, because his coronaries were relatively benign.      Abnormal nuclear stress test    As hoped, this was probably diaphragmatic attenuation. EF by LV gram and certainly better in 16%. False positive stress test         Current medicines are reviewed at length with the patient today. (+/- concerns) none The following changes have been made:  START taking lasix tomorrow. For two days, take 42m (2 tablets) once daily then take 252m(1 tablet) daily  ROV 2 months  Studies Ordered:   Orders Placed This Encounter  Procedures  . EKG 12-Lead      DaGlenetta HewM.D., M.S. Interventional Cardiologist   Pager # [phone] # [phone] [address] [address]

## 2015-07-05 ENCOUNTER — Encounter: Payer: Self-pay | Admitting: Cardiology

## 2015-07-05 NOTE — Assessment & Plan Note (Signed)
No further symptoms of chest pain. Unlikely to have been anginal in nature if not later spasm, because his coronaries were relatively benign.

## 2015-07-05 NOTE — Assessment & Plan Note (Signed)
No recurrence. On low-dose beta blocker.

## 2015-07-05 NOTE — Assessment & Plan Note (Signed)
Pretty much excluded coronary disease. He did have a some evidence of diastolic dysfunction by catheterization numbers with elevated LVEDP and wedge pressure. He is now also complaining of limited edema. Plan: We'll start low-dose Lasix with double up dosing the first 2 days and then once daily after that. We then discussed sliding scale Lasix. We'll monitor his blood pressure and potentially add afterload reduction if possible in the future.

## 2015-07-05 NOTE — Assessment & Plan Note (Signed)
This could be one other reason why he is dyspneic and having recovered from his bout with influenza, he still has the pulmonary embolus that could be leading to some exertional dyspnea. Remains on apixaban without major bleeding.

## 2015-07-05 NOTE — Assessment & Plan Note (Signed)
As hoped, this was probably diaphragmatic attenuation. EF by LV gram and certainly better in [id number]. False positive stress test

## 2015-07-24 DIAGNOSIS — Z0000 Encounter for general adult medical examination without abnormal findings: Secondary | ICD-10-CM | POA: Insufficient documentation

## 2015-08-05 ENCOUNTER — Other Ambulatory Visit: Payer: Self-pay

## 2015-08-05 ENCOUNTER — Other Ambulatory Visit: Payer: Self-pay | Admitting: *Deleted

## 2015-08-05 MED ORDER — METOPROLOL TARTRATE 25 MG PO TABS: 12.5000 mg | ORAL_TABLET | Freq: Two times a day (BID) | ORAL | 3 refills | Status: DC

## 2015-08-27 NOTE — Progress Notes (Signed)
PCP: Jordan Figueroa., MD  Clinic Note: Chief Complaint  Patient presents with  . Other    2 month follow up. Meds reviewed by the pt. verbally. Pt. c/o shortness of breath with little exertion.   . Shortness of Breath  . Tachycardia    History of SVT    HPI: Jordan Figueroa is a 82 y.o. male with a PMH below who presents today for two-month follow-up after being evaluated for exertional dyspnea all during initial diagnosis of SVT.  I originally met him as a consult in the hospital when he presented with influenza that was Located by SVT. Subsequently he noted exertional chest discomfort as well as dyspnea. Based on this suboptimal stress test, he underwent right and left cardiac catheterization on June 16 which failed to reveal any significant explanation for his dyspnea. Mildly elevated LVEDP but with normal wedge pressure. Mildly reduced cardiac output but with no significant CAD.  Jordan Figueroa was last seen in late June after his cardiac catheterization still noticing exertional dyspnea. He had mild lower TIMI edema as well. He was started on low-dose Lasix.  Recent Hospitalizations: None  Studies Reviewed: None  Interval History: Jordan Figueroa still notes having exertional dyspnea, not yet getting back to the level of exercise tolerance that he had had prior to his influenza/PE/SVT admission. He has not had any further episodes of chest pain. His edema has been well controlled since starting the Lasix. He does note that he has a hard time with "get up and go ". Has a harder time getting started than usual.  No chest pain  with rest or exertion.  No PND, orthopnea with stable mild edema on lasix No palpitations, lightheadedness, dizziness, weakness or syncope/near syncope. No TIA/amaurosis fugax symptoms. No melena, hematochezia, hematuria, or epstaxis. No claudication.  ROS: A comprehensive was performed. Review of Systems  Constitutional: Positive for malaise/fatigue (Still  notes exercise intolerance and decreased energy/dyspnea with exertion).  HENT: Negative for nosebleeds.   Respiratory: Positive for shortness of breath (With exertion).   Cardiovascular: Negative for claudication and leg swelling (Controlled with Lasix).  Gastrointestinal: Negative for blood in stool and constipation.  Genitourinary: Negative for hematuria.  Musculoskeletal: Positive for joint pain (Arthritis). Negative for myalgias.  Neurological: Positive for dizziness (When dyspneic, and occasionally when he first stands up).  Endo/Heme/Allergies: Bruises/bleeds easily.  Psychiatric/Behavioral: Negative for depression and memory loss. The patient is not nervous/anxious and does not have insomnia.   All other systems reviewed and are negative.   Past Medical History:  Diagnosis Date  . Cancer ([place])    skin  . Colon polyps   . Coronary artery disease excluded 06/28/15   Minimal CAD on Cardiac Cath  . GERD (gastroesophageal reflux disease)   . Hemorrhoids   . Hiatal hernia   . IBS (irritable bowel syndrome)   . Paroxysmal supraventricular tachycardia [facility] March 2017   First documented during episode of influenza  . Pulmonary embolism [facility])     Past Surgical History:  Procedure Laterality Date  . CARDIAC CATHETERIZATION N/A 06/28/2015   Procedure: Left Heart Cath and Coronary Angiography;  Surgeon: Leonie Man, MD;  Location: [place] [facility];  Service: Cardiovascular;  Laterality: N/A;; proximal and mid RCA focal 20-25% lesions. Mild diffuse calcifications in the left main and proximal LAD ~30%, LVEDP 18 mmHg  . CARDIAC CATHETERIZATION  06/28/2015   Procedure: Right Heart Cath;  Surgeon: Leonie Man, MD;  Location: [place] [facility];  Service:  Cardiovascular;; essentially normal right heart cath pressures. Cardiac Output 4.74.Normal wedge pressure roughly 12 mmHg.   Mar[facility] Kitchen COLON SURGERY  07/14/2013   small bowel obstruction  . SKIN SURGERY    . TRANSTHORACIC ECHOCARDIOGRAM   04/10/2015   EF 60-65%. No RWMA, GR 1 DD. Mild PA pressure elevation of 38 mmHg.   Prior to Admission medications   Medication Sig Start Date End Date Taking? Authorizing Provider  apixaban (ELIQUIS) 5 MG TABS tablet Take 1 tablet (5 mg total) by mouth 2 (two) times daily. 05/07/15  Yes Leonie Man, MD  aspirin EC 81 MG tablet Take 1 tablet (81 mg total) by mouth daily. 06/05/15  Yes Leonie Man, MD  furosemide (LASIX) 20 MG tablet Take 1 tablet (20 mg total) by mouth daily. 07/03/15  Yes Leonie Man, MD  metoprolol tartrate (LOPRESSOR) 25 MG tablet Take 0.5 tablets (12.5 mg total) by mouth 2 (two) times daily. 08/05/15  Yes Leonie Man, MD  nitroGLYCERIN (NITROSTAT) 0.4 MG SL tablet Place 1 tablet (0.4 mg total) under the tongue every 5 (five) minutes as needed for chest pain. 06/05/15  Yes Leonie Man, MD    No Known Allergies  Social History   Social History  . Marital status: Widowed    Spouse name: N/A  . Number of children: N/A  . Years of education: N/A   Social History Main Topics  . Smoking status: Former Smoker    Packs/day: 2.00    Years: 25.00    Types: Cigarettes    Quit date: 02/25/1973  . Smokeless tobacco: Never Used  . Alcohol use No  . Drug use: No  . Sexual activity: Not Asked   Other Topics Concern  . None   Social History Narrative  . None    Family History  Problem Relation Age of Onset  . CVA Father   . Alzheimer's disease Mother   . Heart Problems Sister   . Heart attack Brother      Wt Readings from Last 3 Encounters:  08/28/15 221 lb 4 oz (100.4 kg)  07/03/15 226 lb 8 oz (102.7 kg)  06/28/15 223 lb (101.2 kg)    PHYSICAL EXAM BP 120/80 (BP Location: Left Arm, Patient Position: Sitting, Cuff Size: Normal)   Pulse 61   Ht '5\' 11"'  (1.803 m)   Wt 221 lb 4 oz (100.4 kg)   BMI 30.86 kg/m  General appearance: alert, cooperative, appears stated age, no distress and Mildly obese HEENT: [place]/AT, EOMI, MMM, anicteric  sclera Neck: no adenopathy, no carotid bruit and no JVD Lungs: clear to auscultation bilaterally, normal percussion bilaterally and non-labored Heart: regular rate and rhythm, S1, S2 normal, no murmur, click, rub or gallop; nondisplaced PMI Abdomen: soft, non-tender; bowel sounds normal; no masses, no organomegaly; no HJR Extremities: extremities normal, atraumatic, no cyanosis, or edema; right radial cath site has a small amount of bruising. Apparently his femoral cath site has extensive bruising despite it being a venous line. Pulses: 2+ and symmetric;  Skin: mobility and turgor normal, no edema and no lesions noted  Neurologic: Mental status: Alert, oriented, thought content appropriate Cranial nerves: normal (II-XII grossly intact)   Adult ECG Report  Rate: 61 ;  Rhythm: normal sinus rhythm, premature ventricular contractions (PVC) and 1 AVB (PR 258); LAD (-35)  Narrative Interpretation: Newly noted PVC, but otherwise no significant change.   Other studies Reviewed: Additional studies/ records that were reviewed today include:  Recent Labs:  No  recent labs    ASSESSMENT / PLAN: Problem List Items Addressed This Visit    Paroxysmal SVT (supraventricular tachycardia) (HCC) (Chronic)    No further recurrence. We can probably reduce his beta blocker. We discussed vagal maneuvers. I suspect this was all related to his URI/influenza/PE.      Relevant Medications   metoprolol succinate (TOPROL-XL) 25 MG 24 hr tablet   Other Relevant Orders   EKG 12-Lead (Completed)   History of pulmonary embolus (PE) - Primary (Chronic)    Not a very large PE, however this could be somewhat related to his exertional dyspnea. Continues to be on apixaban with only nuisance bruising. My recommendation would be to simply complete 6 months of Eliquis which would take him out until end of October.      Relevant Orders   EKG 12-Lead (Completed)   Exertional shortness of breath    CAD excluded. Some  diastolic dysfunction with mildly elevated LVEDP. Less dyspneic and less edema with Lasix. Could also be potentially related to some mild chronotropic incompetence on beta blocker. Plan: STOP taking metoprolol tartrate START taking metoprolol succinate 12.14m (1/2 tablet) at dinner.  - Once this is been started, consider the potential of starting ARB/ACE inhibitor for afterload reduction.      Relevant Orders   EKG 12-Lead (Completed)   Diastolic dysfunction with chronic heart failure (HCC) - Mild (Chronic)    On low-dose Lasix. Edema has improved as has orthopnea. Plan be to consider adding ARB/ACE inhibitor a follow-up visit if blood pressure would tolerate once beta blocker dose has been reduced.      Relevant Medications   metoprolol succinate (TOPROL-XL) 25 MG 24 hr tablet    Other Visit Diagnoses   None.     Current medicines are reviewed at length with the patient today. (+/- concerns) DOE, fatigue  The following changes have been made:  STOP taking metoprolol tartrate START taking metoprolol succinate 12.576m(1/2 tablet) at dinner.  Take Eliquis until end of October, then stop  Labwork: none  Testing/Procedures: none  Follow-Up: Your physician wants you to follow-up in: six months with Dr. HaEllyn Hackn [place](or Dr. EnSaunders [place] Studies Ordered:   Orders Placed This Encounter  Procedures  . EKG 12-Lead      DaGlenetta HewM.D., M.S. Interventional Cardiologist   Pager # [phone] # [phone] [address] [id number]

## 2015-08-28 ENCOUNTER — Encounter: Payer: Self-pay | Admitting: Cardiology

## 2015-08-28 ENCOUNTER — Ambulatory Visit (INDEPENDENT_AMBULATORY_CARE_PROVIDER_SITE_OTHER): Payer: Medicare Other | Admitting: Cardiology

## 2015-08-28 ENCOUNTER — Telehealth: Payer: Self-pay | Admitting: Cardiology

## 2015-08-28 VITALS — BP 120/80 | HR 61 | Ht 71.0 in | Wt 221.2 lb

## 2015-08-28 DIAGNOSIS — R0602 Shortness of breath: Secondary | ICD-10-CM

## 2015-08-28 DIAGNOSIS — I5032 Chronic diastolic (congestive) heart failure: Secondary | ICD-10-CM

## 2015-08-28 DIAGNOSIS — I471 Supraventricular tachycardia: Secondary | ICD-10-CM | POA: Diagnosis not present

## 2015-08-28 DIAGNOSIS — Z86711 Personal history of pulmonary embolism: Secondary | ICD-10-CM

## 2015-08-28 MED ORDER — METOPROLOL SUCCINATE ER 25 MG PO TB24: 12.5000 mg | ORAL_TABLET | Freq: Every day | ORAL | 5 refills | Status: DC

## 2015-08-28 NOTE — Patient Instructions (Signed)
Medication Instructions:  Your physician has recommended you make the following change in your medication:  STOP taking metoprolol tartrate START taking metoprolol succinate 12.5mg  (1/2 tablet) at dinner.    Labwork: none  Testing/Procedures: none  Follow-Up: Your physician wants you to follow-up in: six months with Dr. Ellyn Hack in [place].  You will receive a reminder letter in the mail two months in advance. If you don't receive a letter, please call our office to schedule the follow-up appointment.   Any Other Special Instructions Will Be Listed Below (If Applicable).     If you need a refill on your cardiac medications before your next appointment, please call your pharmacy.

## 2015-08-28 NOTE — Telephone Encounter (Signed)
Pt called stating he would like to continue being seen in [place] office instead of [place] w/ Dr. Ellyn Hack as he does not want to drive to [facility] office. Recalled changed.

## 2015-08-29 ENCOUNTER — Encounter: Payer: Self-pay | Admitting: Cardiology

## 2015-08-29 NOTE — Assessment & Plan Note (Signed)
No further recurrence. We can probably reduce his beta blocker. We discussed vagal maneuvers. I suspect this was all related to his URI/influenza/PE.

## 2015-08-29 NOTE — Assessment & Plan Note (Signed)
On low-dose Lasix. Edema has improved as has orthopnea. Plan be to consider adding ARB/ACE inhibitor a follow-up visit if blood pressure would tolerate once beta blocker dose has been reduced.

## 2015-08-29 NOTE — Assessment & Plan Note (Signed)
Not a very large PE, however this could be somewhat related to his exertional dyspnea. Continues to be on apixaban with only nuisance bruising. My recommendation would be to simply complete 6 months of Eliquis which would take him out until end of October.

## 2015-08-29 NOTE — Assessment & Plan Note (Addendum)
CAD excluded. Some diastolic dysfunction with mildly elevated LVEDP. Less dyspneic and less edema with Lasix. Could also be potentially related to some mild chronotropic incompetence on beta blocker. Plan: STOP taking metoprolol tartrate START taking metoprolol succinate 12.5mg  (1/2 tablet) at dinner.  - Once this is been started, consider the potential of starting ARB/ACE inhibitor for afterload reduction.

## 2015-10-24 ENCOUNTER — Other Ambulatory Visit: Payer: Self-pay | Admitting: Cardiology

## 2015-10-24 NOTE — Telephone Encounter (Signed)
Rx request sent to pharmacy.  

## 2015-10-28 ENCOUNTER — Other Ambulatory Visit: Payer: Self-pay

## 2015-10-29 MED ORDER — FUROSEMIDE 20 MG PO TABS: 20.0000 mg | ORAL_TABLET | Freq: Every day | ORAL | 6 refills | Status: DC

## 2015-11-28 ENCOUNTER — Telehealth: Payer: Self-pay | Admitting: Internal Medicine

## 2015-11-28 NOTE — Telephone Encounter (Signed)
Received incoming call from Amy at Dr Tonette Bihari office. She confirmed that Dr Ouida Sills ordered the Eliquis as patient was short of breath at office visit and had history of A fib and pulmonary embolism. Confirmed with Amy that patient and daughter were aware and patient was indeed currently taking the Eliquis since leaving office visit with Dr Ouida Sills this week, as stated by the daughter earlier in the encounter.

## 2015-11-28 NOTE — Telephone Encounter (Signed)
Returned call to daughter, Suanne Marker. Gave her the information I received from Dr Tonette Bihari office. She said Dr Ouida Sills also ordered an echocardiogram. I advised that Dr Ouida Sills would be the one to follow-up on results of that test as he was the one who ordered it. She verbalized understanding and to call us or Dr Tonette Bihari office if she has any other questions.  Will route to Dr End for review.

## 2015-11-28 NOTE — Telephone Encounter (Signed)
Pt daughter calling stating that patient was seen recently by PCP and he placed patient back on Eliqus, she is worried for we took patient off this and now he is being told to take it again.  The pcp is Dr Ouida Sills at [facility]  Would like [place] to please look into this for we took patient off this.

## 2015-11-28 NOTE — Telephone Encounter (Signed)
Spoke with Jordan Figueroa at Dr Tonette Bihari office.  She gave me Dr Tonette Bihari response that patient was on Eliquis for A. Fib and Pulmonary embolism and for being short of breath.

## 2015-11-28 NOTE — Telephone Encounter (Signed)
Returned call to daughter Suanne Marker, ok per DPR, to discuss. She stated patient saw Dr Ouida Sills on 11/25/15 who restarted patient's Eliquis on Monday. Patient had finished out his Eliquis and stopped it as he was told to do so by Dr Ellyn Hack at office visit on 08/28/15. Patient's daughter wants to make sure this is ok.  Patient has restarted taking the Eliquis as advised by Dr Ouida Sills. Daughter is not sure of the reason why the medication was restarted as she was not with her father at the visit.  Called Dr Tonette Bihari office and left message for nurse to return phone call to discuss Eliquis (as office visit from 11/25/15 is not viewable in EPIC at this time).

## 2015-11-28 NOTE — Telephone Encounter (Signed)
I have reviewed all EKGs in our system (back to 2015).  None of them demonstrate atrial fibrillation, though some episodes of SVT and sinus rhythm with frequent PACs are evident.  Based on Dr. Allison Quarry most recent note, I agree with discontinuation of Eliquis after completion of treatment for PE.  Unless Dr. Ouida Sills has other EKG's or history consistent with atrial fibrillation, there is no indication for long-term anticoagulation.  Thanks.

## 2015-11-29 NOTE — Telephone Encounter (Addendum)
Reviewed Dr. Darnelle Bos recommendations w/pt daughter, Suanne Marker, who verbalized understanding for pt to continue Eliquis for PE as instructed by Dr. Ouida Sills  and to f/u w/[facility] regarding treatment with this medication.  Suanne Marker would like for Dr. Saunders Revel to have pt echo results. Advised her to request [facility] to forward echo results after test completion to our office. She verbalized understanding and is appreciative of the call.

## 2015-12-04 ENCOUNTER — Emergency Department
Admission: EM | Admit: 2015-12-04 | Discharge: 2015-12-04 | Disposition: A | Discharge status: Home / Self Care | Payer: Medicare Other | Attending: Emergency Medicine | Admitting: Emergency Medicine

## 2015-12-04 ENCOUNTER — Encounter: Payer: Self-pay | Admitting: Emergency Medicine

## 2015-12-04 ENCOUNTER — Emergency Department: Payer: Medicare Other

## 2015-12-04 DIAGNOSIS — Z85820 Personal history of malignant melanoma of skin: Secondary | ICD-10-CM | POA: Insufficient documentation

## 2015-12-04 DIAGNOSIS — R079 Chest pain, unspecified: Secondary | ICD-10-CM

## 2015-12-04 DIAGNOSIS — Z7982 Long term (current) use of aspirin: Secondary | ICD-10-CM | POA: Insufficient documentation

## 2015-12-04 DIAGNOSIS — M7989 Other specified soft tissue disorders: Secondary | ICD-10-CM | POA: Diagnosis not present

## 2015-12-04 DIAGNOSIS — Z87891 Personal history of nicotine dependence: Secondary | ICD-10-CM | POA: Diagnosis not present

## 2015-12-04 DIAGNOSIS — I509 Heart failure, unspecified: Secondary | ICD-10-CM | POA: Insufficient documentation

## 2015-12-04 DIAGNOSIS — R072 Precordial pain: Secondary | ICD-10-CM | POA: Insufficient documentation

## 2015-12-04 DIAGNOSIS — I2510 Atherosclerotic heart disease of native coronary artery without angina pectoris: Secondary | ICD-10-CM | POA: Insufficient documentation

## 2015-12-04 DIAGNOSIS — Z79899 Other long term (current) drug therapy: Secondary | ICD-10-CM | POA: Diagnosis not present

## 2015-12-04 LAB — CBC
HCT: 48 % (ref 40.0–52.0)
Hemoglobin: 16.5 g/dL (ref 13.0–18.0)
MCH: 29.8 pg (ref 26.0–34.0)
MCHC: 34.5 g/dL (ref 32.0–36.0)
MCV: 86.6 fL (ref 80.0–100.0)
PLATELETS: 187 10*3/uL (ref 150–440)
RBC: 5.54 MIL/uL (ref 4.40–5.90)
RDW: 14.3 % (ref 11.5–14.5)
WBC: 6.8 10*3/uL (ref 3.8–10.6)

## 2015-12-04 LAB — BASIC METABOLIC PANEL
Anion gap: 8 (ref 5–15)
BUN: 17 mg/dL (ref 6–20)
CALCIUM: 8.9 mg/dL (ref 8.9–10.3)
CHLORIDE: 104 mmol/L (ref 101–111)
CO2: 28 mmol/L (ref 22–32)
CREATININE: 1.02 mg/dL (ref 0.61–1.24)
GFR calc non Af Amer: >60 mL/min (ref >60)
GLUCOSE: 97 mg/dL (ref 65–99)
Potassium: 3.8 mmol/L (ref 3.5–5.1)
Sodium: 140 mmol/L (ref 135–145)

## 2015-12-04 LAB — TROPONIN I: Troponin I: <0.03 ng/mL (ref <0.03)

## 2015-12-04 MED ORDER — ASPIRIN 81 MG PO CHEW
243.0000 mg | CHEWABLE_TABLET | Freq: Once | ORAL | Status: AC
  Administered 2015-12-04: 243 mg via ORAL
  Filled 2015-12-04: qty 3

## 2015-12-04 MED ORDER — IOPAMIDOL (ISOVUE-370) INJECTION 76%
75.0000 mL | Freq: Once | INTRAVENOUS | Status: AC | PRN
  Administered 2015-12-04: 75 mL via INTRAVENOUS

## 2015-12-04 NOTE — ED Triage Notes (Addendum)
C/o central constant chest pain that started yesterday and is still minimally present. Has also SHOB. Has had previous pain like this and was admitted but no specific dx. No distress in triage. Denies any other sx. Was dizzy yesterday. From dr Rosana Berger office. Pt does have bluish color noted to lips. He admits they are darker than normal.

## 2015-12-04 NOTE — Discharge Instructions (Signed)

## 2015-12-04 NOTE — ED Notes (Signed)
MD Veronese at bedside  

## 2015-12-04 NOTE — ED Notes (Signed)
PT ambulatory at time of d/c. PT in no acute distress at this time.

## 2015-12-04 NOTE — ED Provider Notes (Signed)
[facility] Emergency Department Provider Note  ____________________________________________  Time seen: Approximately 11:11 AM  I have reviewed the triage vital signs and the nursing notes.   HISTORY  Chief Complaint Chest Pain   HPI Jordan Figueroa is a 82 y.o. male h/o pSVT, PE on ELiquis, GERD, CAD (minimal CAD on LHC done 06/2015) presents for evaluation of chest pain. Patient reports that yesterday evening he was cleaning his back to start grilling when he developed sudden onset of severe substernal chest pressure. Patient describes the pain as an elephant sitting on his chest that was associated with shortness of breath, diaphoresis, and dizziness. Patient reports that he lay down and after 20-30 minutes and the pain got significantly better. This morning when he woke up he still had minimal chest discomfort/soreness which prompted him to go see his primary care doctor. Upon arrival to his PCPs office patient was sent to the emergency room for further evaluation. Currently he describes no chest pain.Patient also endorses worsening dyspnea on exertion since this severe episode of chest tightness yesterday evening. He denies shortness of breath at rest, orthopnea. Patient reports worsening swelling of his left lower extremity. Patient was diagnosed with a DVT in that leg and a PE back in April 2017. He was taken off of Eliquis in August by his cardiologist and restarted again by his PCP a week ago. Patient denies recent travel or hospitalization, hemoptysis. He endorses that he usually has b/l LE swelling and only had unilateral when he was found to have a DVT. Patient denies coughing, chills, fever, abdominal pain, nausea, vomiting.  Past Medical History:  Diagnosis Date  . Cancer ([place])    skin  . Colon polyps   . Coronary artery disease excluded 06/28/15   Minimal CAD on Cardiac Cath  . GERD (gastroesophageal reflux disease)   . Hemorrhoids   . Hiatal hernia     . IBS (irritable bowel syndrome)   . Paroxysmal supraventricular tachycardia [facility]) March 2017   First documented during episode of influenza  . Pulmonary embolism [facility])     Patient Active Problem List   Diagnosis Date Noted  . Diastolic dysfunction with chronic heart failure ([place]) - Mild 07/03/2015  . Abnormal nuclear stress test 06/26/2015  . Chest pain with moderate risk for cardiac etiology 06/06/2015  . History of pulmonary embolus (PE) 06/06/2015  . Exertional shortness of breath 06/06/2015  . Dizziness   . Influenza   . Paroxysmal SVT (supraventricular tachycardia) ([place]) 04/10/2015    Past Surgical History:  Procedure Laterality Date  . CARDIAC CATHETERIZATION N/A 06/28/2015   Procedure: Left Heart Cath and Coronary Angiography;  Surgeon: Leonie Man, MD;  Location: [facility];  Service: Cardiovascular;  Laterality: N/A;; proximal and mid RCA focal 20-25% lesions. Mild diffuse calcifications in the left main and proximal LAD ~30%, LVEDP 18 mmHg  . CARDIAC CATHETERIZATION  06/28/2015   Procedure: Right Heart Cath;  Surgeon: Leonie Man, MD;  Location: [place] [facility];  Service: Cardiovascular;; essentially normal right heart cath pressures. Cardiac Output 4.74.Normal wedge pressure roughly 12 mmHg.   Marland Kitchen COLON SURGERY  07/14/2013   small bowel obstruction  . SKIN SURGERY    . TRANSTHORACIC ECHOCARDIOGRAM  04/10/2015   EF 60-65%. No RWMA, GR 1 DD. Mild PA pressure elevation of 38 mmHg.    Prior to Admission medications   Medication Sig Start Date End Date Taking? Authorizing Provider  apixaban (ELIQUIS) 5 MG TABS tablet Take  1 tablet (5 mg total) by mouth 2 (two) times daily. 05/07/15  Yes Leonie Man, MD  aspirin EC 81 MG tablet Take 1 tablet (81 mg total) by mouth daily. 06/05/15  Yes Leonie Man, MD  furosemide (LASIX) 20 MG tablet Take 1 tablet (20 mg total) by mouth daily. 10/29/15  Yes Christopher End, MD  metoprolol succinate (TOPROL-XL) 25 MG 24 hr  tablet Take 12.5 mg by mouth daily.   Yes Historical Provider, MD  nitroGLYCERIN (NITROSTAT) 0.4 MG SL tablet Place 1 tablet (0.4 mg total) under the tongue every 5 (five) minutes as needed for chest pain. 06/05/15   Leonie Man, MD    Allergies Patient has no known allergies.  Family History  Problem Relation Age of Onset  . CVA Father   . Alzheimer's disease Mother   . Heart Problems Sister   . Heart attack Brother     Social History Social History  Substance Use Topics  . Smoking status: Former Smoker    Packs/day: 2.00    Years: 25.00    Types: Cigarettes    Quit date: 02/25/1973  . Smokeless tobacco: Never Used  . Alcohol use No    Review of Systems  Constitutional: Negative for fever. Eyes: Negative for visual changes. ENT: Negative for sore throat. Neck: No neck pain  Cardiovascular: + chest pain. Respiratory: + DOE. Gastrointestinal: Negative for abdominal pain, vomiting or diarrhea. Genitourinary: Negative for dysuria. + LLE swelling Musculoskeletal: Negative for back pain. Skin: Negative for rash. Neurological: Negative for headaches, weakness or numbness. Psych: No SI or HI  ____________________________________________   PHYSICAL EXAM:  VITAL SIGNS: ED Triage Vitals  Enc Vitals Group     BP 12/04/15 1037 (!) 141/74     Pulse Rate 12/04/15 1036 (!) 53     Resp 12/04/15 1036 18     Temp 12/04/15 1036 97.7 F (36.5 C)     Temp Source 12/04/15 1036 Oral     SpO2 12/04/15 1036 96 %     Weight 12/04/15 1034 213 lb (96.6 kg)     Height 12/04/15 1034 5\' 11"  (1.803 m)     Head Circumference --      Peak Flow --      Pain Score 12/04/15 1034 1     Pain Loc --      Pain Edu? --      Excl. in [place]? --     Constitutional: Alert and oriented. Well appearing and in no apparent distress. HEENT:      Head: Normocephalic and atraumatic.         Eyes: Conjunctivae are normal. Sclera is non-icteric. EOMI. PERRL      Mouth/Throat: Mucous membranes are  moist.       Neck: Supple with no signs of meningismus. Cardiovascular: Regular rate and rhythm. No murmurs, gallops, or rubs. 2+ symmetrical distal pulses are present in all extremities. No JVD. Respiratory: Normal respiratory effort. Lungs are clear to auscultation bilaterally. No wheezes, crackles, or rhonchi.  Gastrointestinal: Soft, non tender, and non distended with positive bowel sounds. No rebound or guarding. Musculoskeletal: No pitting edema, do not appreciate unilateral swelling Neurologic: Normal speech and language. Face is symmetric. Moving all extremities. No gross focal neurologic deficits are appreciated. Skin: Skin is warm, dry and intact. No rash noted. Psychiatric: Mood and affect are normal. Speech and behavior are normal.  ____________________________________________   LABS (all labs ordered are listed, but only abnormal results are displayed)  Labs Reviewed  BASIC METABOLIC PANEL  CBC  TROPONIN I  TROPONIN I   ____________________________________________  EKG  ED ECG REPORT I, Rudene Re, the attending physician, personally viewed and interpreted this ECG.  Sinus bradycardia, rate of 54, first-degree AV block, normal QRS and QTc intervals, left axis deviation, no ST elevations or depressions, T-wave inversion in lead 3 which is new from prior   14:11 - Normal sinus rhythm with rare PVCs, rate of 61, first-degree AV block, normal QRS and QTc intervals, left axis deviation, no ST elevations or depressions. Unchanged from prior. ____________________________________________  RADIOLOGY  CXR: Negative  ____________________________________________   PROCEDURES  Procedure(s) performed: None Procedures Critical Care performed:  None ____________________________________________   INITIAL IMPRESSION / ASSESSMENT AND PLAN / ED COURSE  82 y.o. male h/o pSVT, PE on ELiquis, GERD, CAD (minimal CAD on LHC done 06/2015) presents for evaluation of severe  substernal chest pain that started while cleaning his deck yesterday, no pain at this time. Patient was off of Eliquis for a few months but has been restarted 1 week ago, also complaining of unilateral leg swelling which I do not appreciate on exam. LHC showing minimal disease in 06/2015. EKG with no evidence of ischemia. Will give ASA, nitro, blood work, telemetry, doppler US of b/l LE.   Clinical Course as of Dec 03 1445  Wed Dec 04, 2015  1252 Spoke with Dr. Candis Musa who recommended getting a 2nd set of cardiac enzymes and if negative with recent cath showing minimal disease he thinks it is safe for patient to be discharged home as pain with the severity described by the patient if it was ischemic in nature he would expect that patient's troponin would go up. Doppler negative for DVT however since patient was off of Eliquis will get CTA chest to rule out PE or other cause of pain. Will repeat EKG with 2nd troponin as well.   [CV]  [id number] Patient remained asymptomatic in the emergency room with no chest pain. Cardiac markers 2 and EKG 2 with no evidence of ischemia. CT of the chest with no evidence of PE, pneumothorax, dissection, or pneumonia. Patient will be discharged home at this time with close follow-up with cardiology after Thanksgiving. I instructed patient to return to the emergency department if he has any new symptoms.  [CV]    Clinical Course User Index [CV] Rudene Re, MD    Pertinent labs & imaging results that were available during my care of the patient were reviewed by me and considered in my medical decision making (see chart for details).    ____________________________________________   FINAL CLINICAL IMPRESSION(S) / ED DIAGNOSES  Final diagnoses:  Chest pain, unspecified type      NEW MEDICATIONS STARTED DURING THIS VISIT:  New Prescriptions   No medications on file     Note:  This document was prepared using Dragon voice recognition software and may include  unintentional dictation errors.    Rudene Re, MD 12/04/15 1447

## 2015-12-04 NOTE — ED Notes (Signed)
Patient transported to X-ray 

## 2016-01-16 ENCOUNTER — Emergency Department
Admission: EM | Admit: 2016-01-16 | Discharge: 2016-01-16 | Disposition: A | Discharge status: Home / Self Care | Payer: Medicare Other | Attending: Emergency Medicine | Admitting: Emergency Medicine

## 2016-01-16 DIAGNOSIS — Y929 Unspecified place or not applicable: Secondary | ICD-10-CM | POA: Diagnosis not present

## 2016-01-16 DIAGNOSIS — Z7982 Long term (current) use of aspirin: Secondary | ICD-10-CM | POA: Insufficient documentation

## 2016-01-16 DIAGNOSIS — S61211A Laceration without foreign body of left index finger without damage to nail, initial encounter: Secondary | ICD-10-CM | POA: Diagnosis not present

## 2016-01-16 DIAGNOSIS — Y9389 Activity, other specified: Secondary | ICD-10-CM | POA: Insufficient documentation

## 2016-01-16 DIAGNOSIS — W260XXA Contact with knife, initial encounter: Secondary | ICD-10-CM | POA: Insufficient documentation

## 2016-01-16 DIAGNOSIS — Y999 Unspecified external cause status: Secondary | ICD-10-CM | POA: Insufficient documentation

## 2016-01-16 DIAGNOSIS — I509 Heart failure, unspecified: Secondary | ICD-10-CM | POA: Diagnosis not present

## 2016-01-16 DIAGNOSIS — Z23 Encounter for immunization: Secondary | ICD-10-CM | POA: Insufficient documentation

## 2016-01-16 DIAGNOSIS — Z87891 Personal history of nicotine dependence: Secondary | ICD-10-CM | POA: Insufficient documentation

## 2016-01-16 MED ORDER — TETANUS-DIPHTH-ACELL PERTUSSIS 5-2.5-18.5 LF-MCG/0.5 IM SUSP
0.5000 mL | Freq: Once | INTRAMUSCULAR | Status: AC
  Administered 2016-01-16: 0.5 mL via INTRAMUSCULAR
  Filled 2016-01-16: qty 0.5

## 2016-01-16 MED ORDER — LIDOCAINE HCL (PF) 1 % IJ SOLN
10.0000 mL | Freq: Once | INTRAMUSCULAR | Status: AC
  Administered 2016-01-16: 10 mL
  Filled 2016-01-16: qty 10

## 2016-01-16 NOTE — ED Provider Notes (Signed)
[facility] Emergency Department Provider Note  ____________________________________________  Time seen: Approximately 9:45 PM  I have reviewed the triage vital signs and the nursing notes.   HISTORY  Chief Complaint Laceration    HPI Jordan Figueroa is a 83 y.o. male who presents to emergency department complaining of a laceration to index Finger of the left hand. Patient reports that he was cutting a lemon with a butcher's knife when it slipped and cut his finger. Patient is on blood thinners and states that it has been difficult to control bleeding. He reports full range of motion to the finger. No numbness or tingling. No other injury or complaint. No medications prior to arrival. Patient's last tetanus shot is been greater than 10 years ago.   Past Medical History:  Diagnosis Date  . Cancer ([place])    skin  . Colon polyps   . Coronary artery disease excluded 06/28/15   Minimal CAD on Cardiac Cath  . GERD (gastroesophageal reflux disease)   . Hemorrhoids   . Hiatal hernia   . IBS (irritable bowel syndrome)   . Paroxysmal supraventricular tachycardia [facility]) March 2017   First documented during episode of influenza  . Pulmonary embolism [facility])     Patient Active Problem List   Diagnosis Date Noted  . Diastolic dysfunction with chronic heart failure ([place]) - Mild 07/03/2015  . Abnormal nuclear stress test 06/26/2015  . Chest pain with moderate risk for cardiac etiology 06/06/2015  . History of pulmonary embolus (PE) 06/06/2015  . Exertional shortness of breath 06/06/2015  . Dizziness   . Influenza   . Paroxysmal SVT (supraventricular tachycardia) ([place]) 04/10/2015    Past Surgical History:  Procedure Laterality Date  . CARDIAC CATHETERIZATION N/A 06/28/2015   Procedure: Left Heart Cath and Coronary Angiography;  Surgeon: Leonie Man, MD;  Location: [place] [facility];  Service: Cardiovascular;  Laterality: N/A;; proximal and mid RCA focal 20-25%  lesions. Mild diffuse calcifications in the left main and proximal LAD ~30%, LVEDP 18 mmHg  . CARDIAC CATHETERIZATION  06/28/2015   Procedure: Right Heart Cath;  Surgeon: Leonie Man, MD;  Location: [place] [facility];  Service: Cardiovascular;; essentially normal right heart cath pressures. Cardiac Output 4.74.Normal wedge pressure roughly 12 mmHg.   Marland Kitchen COLON SURGERY  07/14/2013   small bowel obstruction  . SKIN SURGERY    . TRANSTHORACIC ECHOCARDIOGRAM  04/10/2015   EF 60-65%. No RWMA, GR 1 DD. Mild PA pressure elevation of 38 mmHg.    Prior to Admission medications   Medication Sig Start Date End Date Taking? Authorizing Provider  apixaban (ELIQUIS) 5 MG TABS tablet Take 1 tablet (5 mg total) by mouth 2 (two) times daily. 05/07/15   Leonie Man, MD  aspirin EC 81 MG tablet Take 1 tablet (81 mg total) by mouth daily. 06/05/15   Leonie Man, MD  furosemide (LASIX) 20 MG tablet Take 1 tablet (20 mg total) by mouth daily. 10/29/15   Nelva Bush, MD  metoprolol succinate (TOPROL-XL) 25 MG 24 hr tablet Take 12.5 mg by mouth daily.    Historical Provider, MD  nitroGLYCERIN (NITROSTAT) 0.4 MG SL tablet Place 1 tablet (0.4 mg total) under the tongue every 5 (five) minutes as needed for chest pain. 06/05/15   Leonie Man, MD    Allergies Patient has no known allergies.  Family History  Problem Relation Age of Onset  . CVA Father   . Alzheimer's disease Mother   .  Heart Problems Sister   . Heart attack Brother     Social History Social History  Substance Use Topics  . Smoking status: Former Smoker    Packs/day: 2.00    Years: 25.00    Types: Cigarettes    Quit date: 02/25/1973  . Smokeless tobacco: Never Used  . Alcohol use No     Review of Systems  Constitutional: No fever/chills Cardiovascular: no chest pain. Respiratory: no cough. No SOB. Musculoskeletal: Negative for musculoskeletal pain. Skin: Positive for laceration to the index finger of the left  hand Neurological: Negative for headaches, focal weakness or numbness. 10-point ROS otherwise negative.  ____________________________________________   PHYSICAL EXAM:  VITAL SIGNS: ED Triage Vitals  Enc Vitals Group     BP 01/16/16 2007 (!) 149/82     Pulse Rate 01/16/16 2007 71     Resp 01/16/16 2007 20     Temp 01/16/16 2007 97.7 F (36.5 C)     Temp Source 01/16/16 2007 Oral     SpO2 01/16/16 2007 95 %     Weight 01/16/16 2008 223 lb (101.2 kg)     Height 01/16/16 2008 5\' 11"  (1.803 m)     Head Circumference --      Peak Flow --      Pain Score 01/16/16 2007 4     Pain Loc --      Pain Edu? --      Excl. in [place]? --      Constitutional: Alert and oriented. Well appearing and in no acute distress. Eyes: Conjunctivae are normal. PERRL. EOMI. Head: Atraumatic. Neck: No stridor.    Cardiovascular: Normal rate, regular rhythm. Normal S1 and S2.  Good peripheral circulation. Respiratory: Normal respiratory effort without tachypnea or retractions. Lungs CTAB. Good air entry to the bases with no decreased or absent breath sounds. Musculoskeletal: Full range of motion to all extremities. No gross deformities appreciated. Neurologic:  Normal speech and language. No gross focal neurologic deficits are appreciated.  Skin:  Skin is warm, dry and intact. No rash noted.2.5 cm laceration causing a flap to the lateral aspect of the second digit left hand. No foreign body. Bleeding was controlled until dressing is removed. Bleeding does return but is easily controlled with direct pressure. Full range of motion of the digit. Sensation and cap refill less than 2 seconds intact to this digit. Psychiatric: Mood and affect are normal. Speech and behavior are normal. Patient exhibits appropriate insight and judgement.   ____________________________________________   LABS (all labs ordered are listed, but only abnormal results are displayed)  Labs Reviewed - No data to  display ____________________________________________  EKG   ____________________________________________  RADIOLOGY   No results found.  ____________________________________________    PROCEDURES  Procedure(s) performed:    Marland KitchenMarland KitchenLaceration Repair Date/Time: 01/16/2016 9:45 PM Performed by: Betha Loa D Authorized by: Betha Loa D   Consent:    Consent obtained:  Verbal   Consent given by:  Patient   Risks discussed:  Pain Anesthesia (see MAR for exact dosages):    Anesthesia method:  Nerve block   Block location:  2nd digit L hand   Block needle gauge:  27 G   Block anesthetic:  Lidocaine 1% w/o epi   Block technique:  Digital block   Block injection procedure:  Anatomic landmarks identified, introduced needle, negative aspiration for blood and incremental injection   Block outcome:  Anesthesia achieved Laceration details:    Location:  Finger   Finger location:  L index finger   Length (cm):  2.5 Repair type:    Repair type:  Simple Pre-procedure details:    Preparation:  Patient was prepped and draped in usual sterile fashion Exploration:    Hemostasis achieved with:  Direct pressure   Wound exploration: wound explored through full range of motion and entire depth of wound probed and visualized     Wound extent: no foreign bodies/material noted, no nerve damage noted, no tendon damage noted and no underlying fracture noted     Contaminated: no   Treatment:    Area cleansed with:  Betadine   Amount of cleaning:  Standard   Irrigation solution:  Sterile saline   Irrigation method:  Syringe Skin repair:    Repair method:  Sutures   Suture size:  4-0   Suture material:  Nylon   Suture technique:  Simple interrupted   Number of sutures:  8 Approximation:    Approximation:  Close Post-procedure details:    Dressing:  Tube gauze   Patient tolerance of procedure:  Tolerated well, no immediate complications      Medications  lidocaine  (PF) (XYLOCAINE) 1 % injection 10 mL (10 mLs Infiltration Given by Other 01/16/16 2100)  Tdap (BOOSTRIX) injection 0.5 mL (0.5 mLs Intramuscular Given 01/16/16 2026)     ____________________________________________   INITIAL IMPRESSION / ASSESSMENT AND PLAN / ED COURSE  Pertinent labs & imaging results that were available during my care of the patient were reviewed by me and considered in my medical decision making (see chart for details).  Review of the [place] CSRS was performed in accordance of the [place] prior to dispensing any controlled drugs.  Clinical Course     Patient's diagnosis is consistent with A laceration to the second digit of the left hand. This is closed as described above. Patient tolerated well with no complications. Exam is reassuring. Patient is given wound care instructions. His tetanus shot is updated today. Patient may take Tylenol and Motrin at home as needed. He will follow up with urgent care or primary care 1 week for suture removal.. Patient is given ED precautions to return to the ED for any worsening or new symptoms.     ____________________________________________  FINAL CLINICAL IMPRESSION(S) / ED DIAGNOSES  Final diagnoses:  Laceration of left index finger without foreign body without damage to nail, initial encounter      NEW MEDICATIONS STARTED DURING THIS VISIT:  Discharge Medication List as of 01/16/2016  9:48 PM          This chart was dictated using voice recognition software/Dragon. Despite best efforts to proofread, errors can occur which can change the meaning. Any change was purely unintentional.    Darletta Moll, PA-C 01/17/16 [id number]    Lavonia Drafts, MD 01/18/16 0800

## 2016-01-16 NOTE — ED Triage Notes (Signed)
Patient ambulatory to triage with steady gait, without difficulty or distress noted; pt reports cutting left index finger with knife while cutting a lemon PTA

## 2016-02-25 ENCOUNTER — Institutional Professional Consult (permissible substitution): Payer: Medicare Other | Admitting: Internal Medicine

## 2016-02-25 ENCOUNTER — Ambulatory Visit (INDEPENDENT_AMBULATORY_CARE_PROVIDER_SITE_OTHER): Payer: Medicare Other | Admitting: Internal Medicine

## 2016-02-25 ENCOUNTER — Encounter: Payer: Self-pay | Admitting: Internal Medicine

## 2016-02-25 VITALS — BP 128/70 | HR 77 | Ht 71.0 in | Wt 223.2 lb

## 2016-02-25 DIAGNOSIS — R0602 Shortness of breath: Secondary | ICD-10-CM

## 2016-02-25 DIAGNOSIS — I471 Supraventricular tachycardia: Secondary | ICD-10-CM

## 2016-02-25 DIAGNOSIS — I480 Paroxysmal atrial fibrillation: Secondary | ICD-10-CM | POA: Diagnosis not present

## 2016-02-25 DIAGNOSIS — I2510 Atherosclerotic heart disease of native coronary artery without angina pectoris: Secondary | ICD-10-CM | POA: Diagnosis not present

## 2016-02-25 NOTE — Progress Notes (Signed)
Follow-up Outpatient Visit Date: 02/25/2016  Primary Care Provider: Kirk Ruths., MD [place] [address]  Chief Complaint: Chronic shortness of breath  HPI:  Mr. Weigandt is a 83 y.o. year-old male with history of chronic shortness of breath, pulmonary embolism, SVT, reported paroxysmal atrial fibrillation (though no documentation exists in our records), and nonobstructive CAD by cath in 2017, who presents for follow-up of chronic shortness of breath. He was previously followed in our office by Dr. Ellyn Hack, having last been seen in 08/2015. Today, Mr. Swanton is without new complaints. He notes stable exertional dyspnea when walking briskly for about half a mile. This has been stable for about 2 years. He denies chest pain, dyspnea at rest, palpitations, lightheadedness, and orthopnea. He has chronic ankle edema, that is unchanged. He remains on aspirin and apixaban, that has been prescribed by his PCP for history of PE and possible paroxysmal atrial fibrillation.  The patient was seen in the ED for chest pain in 11/2015. The patient notes that he did not feel all that bad at the time was referred by his PCP due to concerning EKG. Workup ED, including troponin x2 and CTA were unrevealing. He has not had any further chest pain.Marland Kitchen  --------------------------------------------------------------------------------------------------  Cardiovascular History & Procedures: Cardiovascular Problems:  Non-obstructive CAD  ? Paroxysmal atrial fibrillation  Paroxysmal SVT  Pulmonary embolism  Risk Factors:  Known CAD, age, and male gender  Cath/PCI:  LHC/RHC (06/28/15): Mild diffuse disease involving the left coronary artery. 20-25% proximal and mid/distal RCA lesions. Mildly elevated left heart filling pressure. Normal right heart filling and pulmonary artery pressures. Normal LVEF with mildly reduced Fick cardiac output/index.  CV  Surgery:  None  EP Procedures and Devices:  None  Non-Invasive Evaluation(s):  Exercise myocardial perfusion stress test (06/07/15): Low risk scan without evidence of ischemia. Apical thinning noted. LVEF reduced at 40%, a likely artifactual.  Transthoracic echocardiogram (04/10/15): Normal LV size and function with LVEF is 60-65%. Grade 1 diastolic dysfunction. Mild mitral regurgitation. Normal RV size and function. Mild pulmonary hypertension.  Recent CV Pertinent Labs: Lab Results  Component Value Date   INR 1.23 06/26/2015   BNP 87.0 04/10/2015   K 3.8 12/04/2015   K 4.1 07/17/2013   MG 1.9 04/10/2015   BUN 17 12/04/2015   BUN 9 07/17/2013   CREATININE 1.02 12/04/2015   CREATININE 0.92 07/17/2013    Past medical and surgical history were reviewed and updated in EPIC.  Outpatient Encounter Prescriptions as of 02/25/2016  Medication Sig  . apixaban (ELIQUIS) 5 MG TABS tablet Take 1 tablet (5 mg total) by mouth 2 (two) times daily.  Marland Kitchen aspirin EC 81 MG tablet Take 1 tablet (81 mg total) by mouth daily.  . furosemide (LASIX) 20 MG tablet Take 1 tablet (20 mg total) by mouth daily.  . metoprolol succinate (TOPROL-XL) 25 MG 24 hr tablet Take 12.5 mg by mouth daily.  . nitroGLYCERIN (NITROSTAT) 0.4 MG SL tablet Place 1 tablet (0.4 mg total) under the tongue every 5 (five) minutes as needed for chest pain.   No facility-administered encounter medications on file as of 02/25/2016.     Allergies: Patient has no known allergies.  Social History   Social History  . Marital status: Widowed    Spouse name: N/A  . Number of children: N/A  . Years of education: N/A   Occupational History  . Not on file.   Social History Main Topics  .  Smoking status: Former Smoker    Packs/day: 2.00    Years: 25.00    Types: Cigarettes    Quit date: 02/25/1973  . Smokeless tobacco: Never Used  . Alcohol use No  . Drug use: No  . Sexual activity: Not on file   Other Topics Concern  .  Not on file   Social History Narrative  . No narrative on file    Family History  Problem Relation Age of Onset  . CVA Father   . Alzheimer's disease Mother   . Heart Problems Sister   . Heart attack Brother     Review of Systems: A 12-system review of systems was performed and was negative except as noted in the HPI.  --------------------------------------------------------------------------------------------------  Physical Exam: BP 128/70 (BP Location: Left Arm, Patient Position: Sitting, Cuff Size: Normal)   Pulse 77   Ht 5\' 11"  (1.803 m)   Wt 223 lb 4 oz (101.3 kg)   BMI 31.14 kg/m   General:  Overweight, elderly man seated comfortably in the exam room. HEENT: No conjunctival pallor or scleral icterus.  Moist mucous membranes.  OP clear. Neck: Supple without lymphadenopathy, thyromegaly, JVD, or HJR.  No carotid bruit. Lungs: Normal work of breathing.  Clear to auscultation bilaterally without wheezes or crackles. Heart: Regular rate and rhythm without murmurs, rubs, or gallops.  Non-displaced PMI. Abd: Bowel sounds present.  Soft, NT/ND without hepatosplenomegaly Ext: Trace pretibial edema.  Radial, PT, and DP pulses are 2+ bilaterally. Skin: warm and dry without rash  EKG:  Normal sinus rhythm with first-degree AV block and left axis deviation. No significant change from prior tracing on 12/04/15 (I have personally reviewed both tracings).  Lab Results  Component Value Date   WBC 6.8 12/04/2015   HGB 16.5 12/04/2015   HCT 48.0 12/04/2015   MCV 86.6 12/04/2015   PLT 187 12/04/2015    Lab Results  Component Value Date   NA 140 12/04/2015   K 3.8 12/04/2015   CL 104 12/04/2015   CO2 28 12/04/2015   BUN 17 12/04/2015   CREATININE 1.02 12/04/2015   GLUCOSE 97 12/04/2015   ALT 22 04/10/2015    No results found for: CHOL, HDL, LDLCALC, LDLDIRECT, TRIG,  CHOLHDL  --------------------------------------------------------------------------------------------------  ASSESSMENT AND PLAN: Chronic shortness of breath No clear cardiac etiology to explain this. Mildly reduced Fick CO/CI was noted on right heart cath, though LVEF is normal. Given the patient's symptoms are stable, no further evaluation is needed at this time. Should his dyspnea worsen, he should speak with his PCP about pulmonary evaluation, including possible PFTs.  SVT and questionable paroxysmal atrial fibrillation Patient without palpitations. EKG today shows sinus rhythm. We have no clear evidence of atrial fibrillation in the past, though his PCP's notes indicate PAF. As the patient is currently on indefinite anticoagulation with apixaban, I feel it is reasonable to discontinue aspirin to minimize potential bleeding risk. We will continue his current dose of metoprolol.  Mild coronary artery disease without angina Mild CAD noted by catheterization last year. Given mild degree of disease and the patient's advanced age, we will defer initiation of statin therapy. Patient to continue apixaban for primary prevention; aspirin will be discontinued to minimize risk of bleeding.  Follow-up: Return to clinic in 1 year.  Nelva Bush, MD 02/25/2016 2:15 PM

## 2016-02-25 NOTE — Patient Instructions (Signed)
Medication Instructions:  Your physician has recommended you make the following change in your medication:  1- STOP taking Aspirin.  Labwork: none  Testing/Procedures: none  Follow-Up: Your physician wants you to follow-up in: 12 MONTHS WITH DR END.  You will receive a reminder letter in the mail two months in advance. If you don't receive a letter, please call our office to schedule the follow-up appointment.   If you need a refill on your cardiac medications before your next appointment, please call your pharmacy.

## 2016-02-26 NOTE — Addendum Note (Signed)
Addended by: Britt Bottom on: 02/26/2016 04:16 PM   Modules accepted: Orders

## 2016-05-27 ENCOUNTER — Telehealth: Payer: Self-pay | Admitting: Internal Medicine

## 2016-05-27 MED ORDER — FUROSEMIDE 20 MG PO TABS
20.0000 mg | ORAL_TABLET | Freq: Every day | ORAL | 3 refills | Status: DC
Start: 2016-05-27 — End: 2017-05-25

## 2016-05-27 NOTE — Telephone Encounter (Signed)
Pt needs a refill on his fluid pill, but wants to make sure Dr. Saunders Revel still wants him to take it. Please call and advise, and if so, please call refill to pharmacy.

## 2016-05-27 NOTE — Telephone Encounter (Signed)
Returned call to patient. Advised patient that Dr End did continue his furosemide at his last office visit on 02/25/16. Verified pharmacy and refill sent to pharmacy. Patient very appreciative.

## 2016-07-30 DIAGNOSIS — M1611 Unilateral primary osteoarthritis, right hip: Secondary | ICD-10-CM | POA: Insufficient documentation

## 2016-08-28 ENCOUNTER — Other Ambulatory Visit: Payer: Self-pay | Admitting: Orthopedic Surgery

## 2016-08-28 ENCOUNTER — Ambulatory Visit
Admission: RE | Admit: 2016-08-28 | Discharge: 2016-08-28 | Disposition: A | Discharge status: Home / Self Care | Payer: Medicare Other | Source: Ambulatory Visit | Attending: Orthopedic Surgery | Admitting: Orthopedic Surgery

## 2016-08-28 DIAGNOSIS — M25559 Pain in unspecified hip: Secondary | ICD-10-CM

## 2016-08-28 DIAGNOSIS — R609 Edema, unspecified: Secondary | ICD-10-CM | POA: Diagnosis not present

## 2016-08-28 DIAGNOSIS — M629 Disorder of muscle, unspecified: Secondary | ICD-10-CM | POA: Insufficient documentation

## 2016-08-28 DIAGNOSIS — M25451 Effusion, right hip: Secondary | ICD-10-CM | POA: Diagnosis not present

## 2016-08-28 DIAGNOSIS — M25551 Pain in right hip: Secondary | ICD-10-CM | POA: Insufficient documentation

## 2016-09-19 ENCOUNTER — Other Ambulatory Visit: Payer: Self-pay | Admitting: Cardiology

## 2016-09-30 ENCOUNTER — Other Ambulatory Visit: Payer: Self-pay | Admitting: Orthopedic Surgery

## 2016-09-30 DIAGNOSIS — M5137 Other intervertebral disc degeneration, lumbosacral region: Secondary | ICD-10-CM

## 2016-09-30 DIAGNOSIS — M5440 Lumbago with sciatica, unspecified side: Principal | ICD-10-CM

## 2016-09-30 DIAGNOSIS — M51379 Other intervertebral disc degeneration, lumbosacral region without mention of lumbar back pain or lower extremity pain: Secondary | ICD-10-CM

## 2016-09-30 DIAGNOSIS — M545 Low back pain, unspecified: Secondary | ICD-10-CM

## 2016-10-07 ENCOUNTER — Ambulatory Visit
Admission: RE | Admit: 2016-10-07 | Discharge: 2016-10-07 | Disposition: A | Discharge status: Home / Self Care | Payer: Medicare Other | Source: Ambulatory Visit | Attending: Orthopedic Surgery | Admitting: Orthopedic Surgery

## 2016-10-07 DIAGNOSIS — M5440 Lumbago with sciatica, unspecified side: Secondary | ICD-10-CM | POA: Insufficient documentation

## 2016-10-07 DIAGNOSIS — M47816 Spondylosis without myelopathy or radiculopathy, lumbar region: Secondary | ICD-10-CM | POA: Insufficient documentation

## 2016-10-07 DIAGNOSIS — M5137 Other intervertebral disc degeneration, lumbosacral region: Secondary | ICD-10-CM | POA: Diagnosis present

## 2016-10-07 DIAGNOSIS — M5136 Other intervertebral disc degeneration, lumbar region: Secondary | ICD-10-CM | POA: Insufficient documentation

## 2016-10-07 DIAGNOSIS — M545 Low back pain, unspecified: Secondary | ICD-10-CM

## 2016-12-22 ENCOUNTER — Ambulatory Visit (INDEPENDENT_AMBULATORY_CARE_PROVIDER_SITE_OTHER): Payer: Medicare Other | Admitting: Urology

## 2016-12-22 ENCOUNTER — Encounter: Payer: Self-pay | Admitting: Urology

## 2016-12-22 VITALS — BP 163/56 | HR 78 | Ht 71.0 in | Wt 220.4 lb

## 2016-12-22 DIAGNOSIS — N138 Other obstructive and reflux uropathy: Secondary | ICD-10-CM | POA: Diagnosis not present

## 2016-12-22 DIAGNOSIS — R351 Nocturia: Secondary | ICD-10-CM

## 2016-12-22 DIAGNOSIS — N401 Enlarged prostate with lower urinary tract symptoms: Secondary | ICD-10-CM | POA: Diagnosis not present

## 2016-12-22 LAB — BLADDER SCAN AMB NON-IMAGING: Scan Result: 16

## 2016-12-22 NOTE — Progress Notes (Signed)
12/22/2016 3:01 PM   Jordan Figueroa 1927-12-30 [id number]  Referring provider: Kirk Ruths, MD [place] [facility] [place], [place] [address]  Chief Complaint  Patient presents with  . Follow-up    HPI: 83 y o. male presents for follow-up BPH.  I last saw him in December 2017 at [facility].  He has been on finasteride for several years and presently has no bothersome lower urinary tract symptoms.  He has nocturia x2-3 which he states is not bothersome.  He states he is voiding with an excellent stream.  He denies dysuria or gross hematuria.  He denies flank, abdominal, pelvic or scrotal pain. He had a prior prostate biopsy in 2006 for a PSA of 6.2 with benign pathology.  He elected to discontinue PSA testing 3 years ago.   PMH: Past Medical History:  Diagnosis Date  . Cancer ([place])    skin  . Colon polyps   . Coronary artery disease excluded 06/28/15   Minimal CAD on Cardiac Cath  . GERD (gastroesophageal reflux disease)   . Hemorrhoids   . Hiatal hernia   . IBS (irritable bowel syndrome)   . Paroxysmal supraventricular tachycardia [facility]) March 2017   First documented during episode of influenza  . Pulmonary embolism [facility])     Surgical History: Past Surgical History:  Procedure Laterality Date  . CARDIAC CATHETERIZATION N/A 06/28/2015   Procedure: Left Heart Cath and Coronary Angiography;  Surgeon: Leonie Man, MD;  Location: [place] CV [facility];  Service: Cardiovascular;  Laterality: N/A;; proximal and mid RCA focal 20-25% lesions. Mild diffuse calcifications in the left main and proximal LAD ~30%, LVEDP 18 mmHg  . CARDIAC CATHETERIZATION  06/28/2015   Procedure: Right Heart Cath;  Surgeon: Leonie Man, MD;  Location: [place] CV [facility];  Service: Cardiovascular;; essentially normal right heart cath pressures. Cardiac Output 4.74.Normal wedge pressure roughly 12 mmHg.   Marland Kitchen COLON SURGERY  07/14/2013   small bowel obstruction  . SKIN SURGERY    .  TRANSTHORACIC ECHOCARDIOGRAM  04/10/2015   EF 60-65%. No RWMA, GR 1 DD. Mild PA pressure elevation of 38 mmHg.    Home Medications:  Allergies as of 12/22/2016   No Known Allergies     Medication List        Accurate as of 12/22/16  3:01 PM. Always use your most recent med list.          apixaban 5 MG Tabs tablet Commonly known as:  ELIQUIS Take 1 tablet (5 mg total) by mouth 2 (two) times daily.   finasteride 5 MG tablet Commonly known as:  PROSCAR Take 5 mg by mouth daily.   furosemide 20 MG tablet Commonly known as:  LASIX Take 1 tablet (20 mg total) by mouth daily.   gabapentin 100 MG capsule Commonly known as:  NEURONTIN Take 100 mg by mouth 3 (three) times daily.   metoprolol succinate 25 MG 24 hr tablet Commonly known as:  TOPROL-XL Take 12.5 mg by mouth daily.   nitroGLYCERIN 0.4 MG SL tablet Commonly known as:  NITROSTAT Place 1 tablet (0.4 mg total) under the tongue every 5 (five) minutes as needed for chest pain.       Allergies: No Known Allergies  Family History: Family History  Problem Relation Age of Onset  . CVA Father   . Alzheimer's disease Mother   . Heart Problems Sister   . Heart attack Brother   . Prostate cancer Neg  Hx   . Bladder Cancer Neg Hx   . Kidney cancer Neg Hx     Social History:  reports that he quit smoking about 43 years ago. His smoking use included cigarettes. He has a 50.00 pack-year smoking history. he has never used smokeless tobacco. He reports that he does not drink alcohol or use drugs.  ROS: UROLOGY Frequent Urination?: No Hard to postpone urination?: No Burning/pain with urination?: No Get up at night to urinate?: Yes Leakage of urine?: Yes Urine stream starts and stops?: Yes Trouble starting stream?: No Do you have to strain to urinate?: No Blood in urine?: No Urinary tract infection?: No Sexually transmitted disease?: No Injury to kidneys or bladder?: No Painful intercourse?: No Weak stream?:  No Erection problems?: Yes Penile pain?: No  Gastrointestinal Nausea?: No Vomiting?: No Indigestion/heartburn?: No Diarrhea?: No Constipation?: No  Constitutional Fever: No Night sweats?: No Weight loss?: No Fatigue?: No  Skin Skin rash/lesions?: No Itching?: No  Eyes Blurred vision?: No Double vision?: No  Ears/Nose/Throat Sore throat?: No Sinus problems?: No  Hematologic/Lymphatic Swollen glands?: No Easy bruising?: Yes  Cardiovascular Leg swelling?: No Chest pain?: No  Respiratory Cough?: No Shortness of breath?: Yes  Endocrine Excessive thirst?: No  Musculoskeletal Back pain?: No Joint pain?: No  Neurological Headaches?: No Dizziness?: No  Psychologic Depression?: No Anxiety?: No  Physical Exam: BP (!) 163/56 (BP Location: Right Arm, Patient Position: Sitting, Cuff Size: Normal)   Pulse 78   Ht 5\' 11"  (1.803 m)   Wt 220 lb 6.4 oz (100 kg)   BMI 30.74 kg/m   Constitutional:  Alert and oriented, No acute distress. HEENT: [place] AT, moist mucus membranes.  Trachea midline, no masses. Cardiovascular: No clubbing, cyanosis, or edema. Respiratory: Normal respiratory effort, no increased work of breathing. GI: Abdomen is soft, nontender, nondistended, no abdominal masses GU: No CVA tenderness.  Prostate 45 g, smooth without nodules or induration Skin: No rashes, bruises or suspicious lesions. Lymph: No cervical or inguinal adenopathy. Neurologic: Grossly intact, no focal deficits, moving all 4 extremities. Psychiatric: Normal mood and affect.  Laboratory Data: Lab Results  Component Value Date   WBC 6.8 12/04/2015   HGB 16.5 12/04/2015   HCT 48.0 12/04/2015   MCV 86.6 12/04/2015   PLT 187 12/04/2015    Lab Results  Component Value Date   CREATININE 1.02 12/04/2015     Assessment & Plan:   He has stable lower urinary tract symptoms.  PVR by bladder scan today was 16 mL.  His finasteride was refilled.  Continue annual follow-up and he  was instructed to call earlier for worsening voiding symptoms.  1. BPH with obstruction/lower urinary tract symptoms   2. Nocturia     [name], [place] [address] [place], [place] [address] [phone]

## 2017-01-13 ENCOUNTER — Other Ambulatory Visit: Payer: Self-pay

## 2017-01-13 MED ORDER — FINASTERIDE 5 MG PO TABS: 5.0000 mg | ORAL_TABLET | Freq: Every day | ORAL | 3 refills | Status: DC

## 2017-02-23 ENCOUNTER — Ambulatory Visit (INDEPENDENT_AMBULATORY_CARE_PROVIDER_SITE_OTHER): Payer: Medicare Other | Admitting: Podiatry

## 2017-02-23 ENCOUNTER — Ambulatory Visit (INDEPENDENT_AMBULATORY_CARE_PROVIDER_SITE_OTHER): Payer: Medicare Other

## 2017-02-23 VITALS — BP 127/79 | HR 75 | Ht 71.0 in | Wt 228.0 lb

## 2017-02-23 DIAGNOSIS — M722 Plantar fascial fibromatosis: Secondary | ICD-10-CM | POA: Diagnosis not present

## 2017-02-23 DIAGNOSIS — M659 Synovitis and tenosynovitis, unspecified: Secondary | ICD-10-CM | POA: Diagnosis not present

## 2017-02-23 NOTE — Progress Notes (Signed)
   Subjective:    Patient ID: Jordan Figueroa, male    DOB: 1927/05/19, 84 y.o.   MRN: [id number]  HPI  Chief Complaint  Patient presents with  . Foot Pain    Right heel pain x 3 months       Review of Systems  All other systems reviewed and are negative.      Objective:   Physical Exam        Assessment & Plan:

## 2017-02-24 NOTE — Progress Notes (Signed)
   Subjective:  84 year old male presenting today as a new patient with a chief complaint of progressively worsening pain to the right heel that began three months ago. He states the pain is preventing him from exercising. Walking and bearing weight increases the pain. He denies alleviating factors. Patient presents for further treatment and evaluation.   Past Medical History:  Diagnosis Date  . Cancer ([place])    skin  . Colon polyps   . Coronary artery disease excluded 06/28/15   Minimal CAD on Cardiac Cath  . GERD (gastroesophageal reflux disease)   . Hemorrhoids   . Hiatal hernia   . IBS (irritable bowel syndrome)   . Paroxysmal supraventricular tachycardia [facility]) March 2017   First documented during episode of influenza  . Pulmonary embolism ([place])      Objective / Physical Exam:  General:  The patient is alert and oriented x3 in no acute distress. Dermatology:  Skin is warm, dry and supple bilateral lower extremities. Negative for open lesions or macerations. Vascular:  Edema noted to bilateral lower extremities. Palpable pedal pulses bilaterally. No erythema noted. Capillary refill within normal limits. Neurological:  Epicritic and protective threshold grossly intact bilaterally.  Musculoskeletal Exam:  Pain on palpation to the anterior lateral medial aspects of the patient's right ankle. Mild edema noted.  Range of motion within normal limits to all pedal and ankle joints bilateral. Muscle strength 5/5 in all groups bilateral.   Radiographic Exam:  Normal osseous mineralization. Joint spaces preserved. No fracture/dislocation/boney destruction.    Assessment: #1 pain in right ankle #2 synovitis of right ankle #3 bilateral lower extremity edema   Plan of Care:  #1 Patient was evaluated. X-Rays reviewed.  #2 injection of 0.5 mL Celestone Soluspan injected in the patient's right ankle. #3 Compression anklet dispensed for the right ankle. #4 Recommended good shoe  gear. #5 Return to clinic in 6 weeks.  Patient walks 4 miles per day.   Edrick Kins, DPM [facility]  Dr. Edrick Kins, [place]                                        [place], [place] [address]                Office [phone]  Fax [phone]

## 2017-03-09 ENCOUNTER — Encounter: Payer: Self-pay | Admitting: Podiatry

## 2017-03-09 ENCOUNTER — Ambulatory Visit (INDEPENDENT_AMBULATORY_CARE_PROVIDER_SITE_OTHER): Payer: Medicare Other | Admitting: Podiatry

## 2017-03-09 DIAGNOSIS — M7671 Peroneal tendinitis, right leg: Secondary | ICD-10-CM | POA: Diagnosis not present

## 2017-03-09 DIAGNOSIS — M659 Synovitis and tenosynovitis, unspecified: Secondary | ICD-10-CM | POA: Diagnosis not present

## 2017-03-11 NOTE — Progress Notes (Signed)
   Subjective:  84 year old male presenting today for follow up evaluation of right ankle pain. He states the pain was improving until last week. He states the swelling has improved. Receiving the injection has helped alleviate the pain. Patient presents for further treatment and evaluation.   Past Medical History:  Diagnosis Date  . Cancer ([place])    skin  . Colon polyps   . Coronary artery disease excluded 06/28/15   Minimal CAD on Cardiac Cath  . GERD (gastroesophageal reflux disease)   . Hemorrhoids   . Hiatal hernia   . IBS (irritable bowel syndrome)   . Paroxysmal supraventricular tachycardia [facility]) March 2017   First documented during episode of influenza  . Pulmonary embolism ([place])      Objective / Physical Exam:  General:  The patient is alert and oriented x3 in no acute distress. Dermatology:  Skin is warm, dry and supple bilateral lower extremities. Negative for open lesions or macerations. Vascular:  Palpable pedal pulses bilaterally. No erythema noted. Capillary refill within normal limits. Neurological:  Epicritic and protective threshold grossly intact bilaterally.  Musculoskeletal Exam:  Pain on palpation to the anterior lateral medial aspects of the patient's right ankle. Mild edema noted. Pain on palpation to the peroneal tendons of the right foot. Range of motion within normal limits to all pedal and ankle joints bilateral. Muscle strength 5/5 in all groups bilateral.   Radiographic Exam:  Normal osseous mineralization. Joint spaces preserved. No fracture/dislocation/boney destruction.    Assessment: #1 insertional peroneal tendinitis  #2 ankle DJD/synovitis right   Plan of Care:  #1 Patient was evaluated. #2 injection of 0.5 mL Celestone Soluspan injected in the patient's right ankle. #3 Injection of 0.5 mLs Celestone Soluspan injected into the peroneal tendons of the right foot. Care was taken to avoid direct injection into the tendon.  #4 Continue wearing  compression anklet.  #5 Patient on anticoagulant therapy and cannot take oral NSAIDs.  #6 Return to clinic as needed.   Patient walks 4 miles per day.   Edrick Kins, DPM [facility]  Dr. Edrick Kins, [place]                                        [place], [place] [address]                Office [phone]  Fax [phone]

## 2017-04-06 ENCOUNTER — Encounter: Payer: Self-pay | Admitting: Podiatry

## 2017-04-06 ENCOUNTER — Ambulatory Visit (INDEPENDENT_AMBULATORY_CARE_PROVIDER_SITE_OTHER): Payer: Medicare Other | Admitting: Podiatry

## 2017-04-06 ENCOUNTER — Ambulatory Visit: Payer: Medicare Other | Admitting: Podiatry

## 2017-04-06 DIAGNOSIS — M7671 Peroneal tendinitis, right leg: Secondary | ICD-10-CM

## 2017-04-06 DIAGNOSIS — M659 Synovitis and tenosynovitis, unspecified: Secondary | ICD-10-CM | POA: Diagnosis not present

## 2017-04-06 NOTE — Progress Notes (Signed)
   Subjective:  84 year old male presenting today for follow up evaluation of right ankle pain. He states the pain has improved but has not resolved completely. The injection has helped provide some relief from the symptoms. Patient presents for further treatment and evaluation.   Past Medical History:  Diagnosis Date  . Cancer ([place])    skin  . Colon polyps   . Coronary artery disease excluded 06/28/15   Minimal CAD on Cardiac Cath  . GERD (gastroesophageal reflux disease)   . Hemorrhoids   . Hiatal hernia   . IBS (irritable bowel syndrome)   . Paroxysmal supraventricular tachycardia [facility]) March 2017   First documented during episode of influenza  . Pulmonary embolism ([place])      Objective / Physical Exam:  General:  The patient is alert and oriented x3 in no acute distress. Dermatology:  Skin is warm, dry and supple bilateral lower extremities. Negative for open lesions or macerations. Vascular:  Palpable pedal pulses bilaterally. No erythema noted. Capillary refill within normal limits. Neurological:  Epicritic and protective threshold grossly intact bilaterally.  Musculoskeletal Exam:  Pain on palpation to the anterior lateral medial aspects of the patient's right ankle. Mild edema noted. Pain on palpation to the peroneal tendons of the right foot. Range of motion within normal limits to all pedal and ankle joints bilateral. Muscle strength 5/5 in all groups bilateral.   Assessment: #1 insertional peroneal tendinitis - improved #2 ankle DJD/synovitis right - improved  Plan of Care:  #1 Patient was evaluated. #2 injection of 0.5 mL Celestone Soluspan injected in the patient's right ankle. #3 Injection of 0.5 mLs Celestone Soluspan injected into the peroneal tendons of the right foot. Care was taken to avoid direct injection into the tendon.  #4 Continue wearing compression anklet.  #5 Patient on anticoagulant therapy and cannot take oral NSAIDs.  #6 Return to clinic in 8  weeks.    Patient walks 4 miles per day.   Edrick Kins, DPM [facility]  Dr. Edrick Kins, [place]                                        [place], [place] [address]                Office [phone]  Fax [phone]

## 2017-04-07 ENCOUNTER — Encounter: Payer: Self-pay | Admitting: Internal Medicine

## 2017-04-07 ENCOUNTER — Ambulatory Visit (INDEPENDENT_AMBULATORY_CARE_PROVIDER_SITE_OTHER): Payer: Medicare Other | Admitting: Internal Medicine

## 2017-04-07 VITALS — BP 140/60 | HR 83 | Ht 71.0 in | Wt 229.0 lb

## 2017-04-07 DIAGNOSIS — I10 Essential (primary) hypertension: Secondary | ICD-10-CM | POA: Diagnosis not present

## 2017-04-07 DIAGNOSIS — I2510 Atherosclerotic heart disease of native coronary artery without angina pectoris: Secondary | ICD-10-CM | POA: Diagnosis not present

## 2017-04-07 DIAGNOSIS — R55 Syncope and collapse: Secondary | ICD-10-CM

## 2017-04-07 DIAGNOSIS — I471 Supraventricular tachycardia: Secondary | ICD-10-CM

## 2017-04-07 DIAGNOSIS — R0602 Shortness of breath: Secondary | ICD-10-CM | POA: Diagnosis not present

## 2017-04-07 DIAGNOSIS — Z7901 Long term (current) use of anticoagulants: Secondary | ICD-10-CM

## 2017-04-07 DIAGNOSIS — R0609 Other forms of dyspnea: Secondary | ICD-10-CM | POA: Diagnosis not present

## 2017-04-07 DIAGNOSIS — I493 Ventricular premature depolarization: Secondary | ICD-10-CM

## 2017-04-07 NOTE — Progress Notes (Signed)
Follow-up Outpatient Visit Date: 04/07/2017  Primary Care Provider: Kirk Ruths, MD [facility] [place] [address]  Chief Complaint: Shortness of breath and palpitations  HPI:  Jordan Figueroa is a 84 y.o. year-old male with history of chronic shortness of breath, pulmonary embolism, SVT, reported paroxysmal atrial fibrillation (though no documentation exists in our records), and nonobstructive CAD by cath in 2017, who presents for follow-up of shortness of breath.  I last saw Jordan Figueroa in 02/2016, at which time he reported stable exertional dyspnea with brisk walking.  This had not worsened over the preceding 2 years.  Today, Jordan Figueroa reports feeling well.  His exertional dyspnea is minimally worse compared with last year.  He has not had any chest pain and notes only rare palpitations or skipped beats.  He had an episode of lightheadedness while seated yesterday.  He felt his vision going dark though he did not pass out.  This resolved spontaneously after 30 seconds.  There were no other accompanying symptoms.  He denies orthopnea and PND.  He has noted more dependent leg edema and began using compression stockings 6-8 weeks ago with significant benefit.  He denies orthopnea and PND.  [name] remains on apixaban, though this had previously been discontinued by Jordan Figueroa.  He has a history of remote pulmonary embolism as well as questionable atrial fibrillation, though review of our records shows no evidence of a-fib in the past.  He has not noticed any spontaneous bleeding but has required stitches because of hand/finger lacerations that would not stop bleeding.  --------------------------------------------------------------------------------------------------  Cardiovascular History & Procedures: Cardiovascular Problems:  Non-obstructive CAD  ? Paroxysmal atrial fibrillation  Paroxysmal SVT  Pulmonary embolism  Risk Factors:  Known  CAD, age, and male gender  Cath/PCI:  LHC/RHC (06/28/15): Mild diffuse disease involving the left coronary artery. 20-25% proximal and mid/distal RCA lesions. Mildly elevated left heart filling pressure. Normal right heart filling and pulmonary artery pressures. Normal LVEF with mildly reduced Fick cardiac output/index.  CV Surgery:  None  EP Procedures and Devices:  None  Non-Invasive Evaluation(s):  Exercise myocardial perfusion stress test (06/07/15): Low risk scan without evidence of ischemia. Apical thinning noted. LVEF reduced at 40%, a likely artifactual.  Transthoracic echocardiogram (04/10/15): Normal LV size and function with LVEF is 60-65%. Grade 1 diastolic dysfunction. Mild mitral regurgitation. Normal RV size and function. Mild pulmonary hypertension.  Recent CV Pertinent Labs: Lab Results  Component Value Date   INR 1.23 06/26/2015   BNP 87.0 04/10/2015   K 3.8 12/04/2015   K 4.1 07/17/2013   MG 1.9 04/10/2015   BUN 17 12/04/2015   BUN 9 07/17/2013   CREATININE 1.02 12/04/2015   CREATININE 0.92 07/17/2013    Past medical and surgical history were reviewed and updated in EPIC.  Current Meds  Medication Sig  . apixaban (ELIQUIS) 5 MG TABS tablet Take 1 tablet (5 mg total) by mouth 2 (two) times daily.  . finasteride (PROSCAR) 5 MG tablet Take 1 tablet (5 mg total) by mouth daily.  . furosemide (LASIX) 20 MG tablet Take 1 tablet (20 mg total) by mouth daily.  Marland Kitchen gabapentin (NEURONTIN) 100 MG capsule Take 100 mg by mouth 3 (three) times daily.  . metoprolol succinate (TOPROL-XL) 25 MG 24 hr tablet Take 12.5 mg by mouth daily.  . nitroGLYCERIN (NITROSTAT) 0.4 MG SL tablet Place 1 tablet (0.4 mg total) under the tongue every 5 (five) minutes as  needed for chest pain.    Allergies: Patient has no known allergies.  Social History   Socioeconomic History  . Marital status: Widowed    Spouse name: Not on file  . Number of children: Not on file  . Years of  education: Not on file  . Highest education level: Not on file  Occupational History  . Not on file  Social Needs  . Financial resource strain: Not on file  . Food insecurity:    Worry: Not on file    Inability: Not on file  . Transportation needs:    Medical: Not on file    Non-medical: Not on file  Tobacco Use  . Smoking status: Former Smoker    Packs/day: 2.00    Years: 25.00    Pack years: 50.00    Types: Cigarettes    Last attempt to quit: 02/25/1973    Years since quitting: 44.1  . Smokeless tobacco: Never Used  Substance and Sexual Activity  . Alcohol use: No    Alcohol/week: 0.0 oz  . Drug use: No  . Sexual activity: Not on file  Lifestyle  . Physical activity:    Days per week: Not on file    Minutes per session: Not on file  . Stress: Not on file  Relationships  . Social connections:    Talks on phone: Not on file    Gets together: Not on file    Attends religious service: Not on file    Active member of club or organization: Not on file    Attends meetings of clubs or organizations: Not on file    Relationship status: Not on file  . Intimate partner violence:    Fear of current or ex partner: Not on file    Emotionally abused: Not on file    Physically abused: Not on file    Forced sexual activity: Not on file  Other Topics Concern  . Not on file  Social History Narrative  . Not on file    Family History  Problem Relation Age of Onset  . CVA Father   . Alzheimer's disease Mother   . Heart Problems Sister   . Heart attack Brother   . Prostate cancer Neg Hx   . Bladder Cancer Neg Hx   . Kidney cancer Neg Hx     Review of Systems: A 12-system review of systems was performed and was negative except as noted in the HPI.  --------------------------------------------------------------------------------------------------  Physical Exam: BP 140/60 (BP Location: Left Arm, Patient Position: Sitting, Cuff Size: Normal)   Pulse 83   Ht 5\' 11"  (1.803  m)   Wt 229 lb (103.9 kg)   BMI 31.94 kg/m   General: NAD. HEENT: No conjunctival pallor or scleral icterus. Moist mucous membranes.  OP clear. Neck: Supple without lymphadenopathy, thyromegaly, JVD, or HJR. No carotid bruit. Lungs: Normal work of breathing. Clear to auscultation bilaterally without wheezes or crackles. Heart: Regular rate and rhythm with occasional extrasystoles.  No murmurs or rubs.  Nondisplaced PMI. Abd: Bowel sounds present. Soft, NT/ND without hepatosplenomegaly Ext: Trace to 1+ pretibial edema right greater than left. Skin: Warm and dry without rash.  EKG: Normal sinus rhythm with first-degree AV block and frequent PVCs.  Left axis deviation.  Lab Results  Component Value Date   WBC 6.8 12/04/2015   HGB 16.5 12/04/2015   HCT 48.0 12/04/2015   MCV 86.6 12/04/2015   PLT 187 12/04/2015    Lab Results  Component  Value Date   NA 140 12/04/2015   K 3.8 12/04/2015   CL 104 12/04/2015   CO2 28 12/04/2015   BUN 17 12/04/2015   CREATININE 1.02 12/04/2015   GLUCOSE 97 12/04/2015   ALT 22 04/10/2015    No results found for: CHOL, HDL, LDLCALC, LDLDIRECT, TRIG, CHOLHDL  --------------------------------------------------------------------------------------------------  ASSESSMENT AND PLAN: Chronic shortness of breath and PVC's This is slightly worse than last year.  Mr. Berent has noted more leg edema over the last 1-2 months.  His weight has also increased by 9 pounds since 12/2016.  EKG and physical exam also reveal ventricular ectopy.  We have agreed to obtain a basic metabolic panel, magnesium level, and TSH today.  I have also recommended a transthoracic echocardiogram to evaluate for new or worsening structural abnormalities that could be causing these signs and symptoms.  For now, we will continue with furosemide 20 mg daily.  Nonobstructive coronary artery disease No chest pain.  Continue current medications to prevent progression of  disease.  SVT Rare palpitations noted, none of which are sustained.  No further workup at this time.  Near syncope This is only happened once.  I wonder if frequent PVCs or other transient arrhythmia could have been contributing.  We will begin with an echocardiogram and labs, as outlined above.  If he has recurrence of this, ambulatory monitoring will need to be considered.  Chronic anticoagulation Mr. Doverspike is a history of pulmonary embolism many years ago.  Notes from Dr. Ouida Sills also indicate paroxysmal atrial fibrillation, though previous review of our chart showed no evidence of this.  However, given history of PE, I am not opposed to continuing apixaban unless bleeding becomes a bigger issue.  I have encouraged Mr. Vigo to avoid using knives and doing other activities that could result in lacerations.  Hypertension Blood pressure borderline elevated today.  I have encouraged him to minimize his sodium intake.  No medication changes at this time.  Follow-up: Return to clinic in 3 months.  Nelva Bush, MD 04/07/2017 2:35 PM

## 2017-04-07 NOTE — Patient Instructions (Signed)
Medication Instructions:  Your physician recommends that you continue on your current medications as directed. Please refer to the Current Medication list given to you today.   Labwork: Your physician recommends that you return for lab work in: TODAY (BMET, TSH, MG)   Testing/Procedures: Your physician has requested that you have an echocardiogram. Echocardiography is a painless test that uses sound waves to create images of your heart. It provides your doctor with information about the size and shape of your heart and how well your heart's chambers and valves are working. This procedure takes approximately one hour. There are no restrictions for this procedure.    Follow-Up: Your physician recommends that you schedule a follow-up appointment in: 3 MONTHS WITH APP.  If you need a refill on your cardiac medications before your next appointment, please call your pharmacy.   Echocardiogram An echocardiogram, or echocardiography, uses sound waves (ultrasound) to produce an image of your heart. The echocardiogram is simple, painless, obtained within a short period of time, and offers valuable information to your health care provider. The images from an echocardiogram can provide information such as:  Evidence of coronary artery disease (CAD).  Heart size.  Heart muscle function.  Heart valve function.  Aneurysm detection.  Evidence of a past heart attack.  Fluid buildup around the heart.  Heart muscle thickening.  Assess heart valve function.  Tell a health care provider about:  Any allergies you have.  All medicines you are taking, including vitamins, herbs, eye drops, creams, and over-the-counter medicines.  Any problems you or family members have had with anesthetic medicines.  Any blood disorders you have.  Any surgeries you have had.  Any medical conditions you have.  Whether you are pregnant or may be pregnant. What happens before the procedure? No special  preparation is needed. Eat and drink normally. What happens during the procedure?  In order to produce an image of your heart, gel will be applied to your chest and a wand-like tool (transducer) will be moved over your chest. The gel will help transmit the sound waves from the transducer. The sound waves will harmlessly bounce off your heart to allow the heart images to be captured in real-time motion. These images will then be recorded.  You may need an IV to receive a medicine that improves the quality of the pictures. What happens after the procedure? You may return to your normal schedule including diet, activities, and medicines, unless your health care provider tells you otherwise. This information is not intended to replace advice given to you by your health care provider. Make sure you discuss any questions you have with your health care provider. Document Released: 12/27/1999 Document Revised: 08/17/2015 Document Reviewed: 09/05/2012 Elsevier Interactive Patient Education  2017 [employer].

## 2017-04-08 ENCOUNTER — Encounter: Payer: Self-pay | Admitting: Internal Medicine

## 2017-04-08 DIAGNOSIS — Z7901 Long term (current) use of anticoagulants: Secondary | ICD-10-CM | POA: Insufficient documentation

## 2017-04-08 DIAGNOSIS — R55 Syncope and collapse: Secondary | ICD-10-CM | POA: Insufficient documentation

## 2017-04-08 DIAGNOSIS — I10 Essential (primary) hypertension: Secondary | ICD-10-CM | POA: Insufficient documentation

## 2017-04-08 DIAGNOSIS — I493 Ventricular premature depolarization: Secondary | ICD-10-CM | POA: Insufficient documentation

## 2017-04-08 DIAGNOSIS — I2510 Atherosclerotic heart disease of native coronary artery without angina pectoris: Secondary | ICD-10-CM | POA: Insufficient documentation

## 2017-04-08 DIAGNOSIS — I25119 Atherosclerotic heart disease of native coronary artery with unspecified angina pectoris: Secondary | ICD-10-CM | POA: Insufficient documentation

## 2017-04-08 LAB — BASIC METABOLIC PANEL
BUN / CREAT RATIO: 20 (ref 10–24)
BUN: 20 mg/dL (ref 8–27)
CO2: 20 mmol/L (ref 20–29)
CREATININE: 1.02 mg/dL (ref 0.76–1.27)
Calcium: 9.4 mg/dL (ref 8.6–10.2)
Chloride: 102 mmol/L (ref 96–106)
GFR, EST AFRICAN AMERICAN: 76 mL/min/{1.73_m2} (ref >59)
GFR, EST NON AFRICAN AMERICAN: 66 mL/min/{1.73_m2} (ref >59)
GLUCOSE: 159 mg/dL — AB (ref 65–99)
Potassium: 4.2 mmol/L (ref 3.5–5.2)
Sodium: 143 mmol/L (ref 134–144)

## 2017-04-08 LAB — MAGNESIUM: Magnesium: 2.3 mg/dL (ref 1.6–2.3)

## 2017-04-08 LAB — TSH: TSH: 1.57 u[IU]/mL (ref 0.450–4.500)

## 2017-04-29 ENCOUNTER — Ambulatory Visit (INDEPENDENT_AMBULATORY_CARE_PROVIDER_SITE_OTHER): Payer: Medicare Other

## 2017-04-29 ENCOUNTER — Other Ambulatory Visit: Payer: Self-pay

## 2017-04-29 DIAGNOSIS — R0602 Shortness of breath: Secondary | ICD-10-CM | POA: Diagnosis not present

## 2017-04-29 DIAGNOSIS — I493 Ventricular premature depolarization: Secondary | ICD-10-CM

## 2017-05-11 ENCOUNTER — Encounter: Payer: Self-pay | Admitting: Podiatry

## 2017-05-11 ENCOUNTER — Ambulatory Visit (INDEPENDENT_AMBULATORY_CARE_PROVIDER_SITE_OTHER): Payer: Medicare Other | Admitting: Podiatry

## 2017-05-11 DIAGNOSIS — M7671 Peroneal tendinitis, right leg: Secondary | ICD-10-CM

## 2017-05-11 DIAGNOSIS — M659 Synovitis and tenosynovitis, unspecified: Secondary | ICD-10-CM

## 2017-05-11 MED ORDER — METHYLPREDNISOLONE 4 MG PO TBPK: ORAL_TABLET | ORAL | 0 refills | Status: DC

## 2017-05-12 ENCOUNTER — Telehealth: Payer: Self-pay | Admitting: Podiatry

## 2017-05-12 NOTE — Telephone Encounter (Signed)
I was in yesterday and got an ankle brace. The nurse told me if I couldn't it right to call her back or to come by and she would show me how to re-do it. I would like to swing by and have her show me how to do that ankle brace again. I'm just having trouble with it. My number is [phone]. Thank you so much for your call.

## 2017-05-12 NOTE — Telephone Encounter (Signed)
Returned patient call, he did not think brace was put on right.  He will come to office so I can reapply brace with patient teaching.

## 2017-05-13 NOTE — Progress Notes (Signed)
   Subjective:  84 year old male presenting today for follow up evaluation of right foot and ankle pain. He reports worsening pain for the past week. Walking increases the pain. He has been wearing the compression anklet for treatment. Patient presents for further treatment and evaluation.   Past Medical History:  Diagnosis Date  . Cancer ([place])    skin  . Colon polyps   . Coronary artery disease excluded 06/28/15   Minimal CAD on Cardiac Cath  . GERD (gastroesophageal reflux disease)   . Hemorrhoids   . Hiatal hernia   . IBS (irritable bowel syndrome)   . Paroxysmal supraventricular tachycardia [facility]) March 2017   First documented during episode of influenza  . Pulmonary embolism ([place])      Objective / Physical Exam:  General:  The patient is alert and oriented x3 in no acute distress. Dermatology:  Skin is warm, dry and supple bilateral lower extremities. Negative for open lesions or macerations. Vascular:  Palpable pedal pulses bilaterally. No erythema noted. Capillary refill within normal limits. Neurological:  Epicritic and protective threshold grossly intact bilaterally.  Musculoskeletal Exam:  Pain on palpation to the anterior lateral medial aspects of the patient's right ankle. Mild edema noted. Pain on palpation to the peroneal of the right foot. Range of motion within normal limits to all pedal and ankle joints bilateral. Muscle strength 5/5 in all groups bilateral.   Assessment: #1 peroneal tendinitis right  #2 right ankle synovitis   Plan of Care:  #1 Patient was evaluated. #2 injection of 0.5 mL Celestone Soluspan injected in the patient's right ankle. #3 Injection of 0.5 mLs Celestone Soluspan injected into the peroneal tendon sheath of the right foot.  #4 Ankle brace dispensed.  #5 Prescription for Medrol Dose Pak provided to patient.  #6 Patient is on anticoagulant therapy and cannot take oral NSAIDs.  #7 Return to clinic in 6 weeks.     Patient walks 4  miles per day.   Edrick Kins, DPM [facility]  Dr. Edrick Kins, [address]                                        [place], [place] [address]                Office [phone]  Fax [phone]

## 2017-05-19 ENCOUNTER — Other Ambulatory Visit: Payer: Self-pay | Admitting: Internal Medicine

## 2017-05-25 ENCOUNTER — Other Ambulatory Visit: Payer: Self-pay

## 2017-05-25 MED ORDER — FUROSEMIDE 20 MG PO TABS: 20.0000 mg | ORAL_TABLET | Freq: Every day | ORAL | 3 refills | Status: DC

## 2017-06-01 ENCOUNTER — Ambulatory Visit: Payer: Medicare Other | Admitting: Podiatry

## 2017-06-17 NOTE — Progress Notes (Signed)
Cardiology Office Note Date:  06/18/2017  Patient ID:  Jordan Figueroa, DOB May 07, 1927, MRN [id number] PCP:  Kirk Ruths, MD  Cardiologist:  Dr. Saunders Revel, MD    Chief Complaint: Follow-up  History of Present Illness: Jordan Figueroa is a 84 y.o. male with history of nonobstructive CAD by [place] in 2017, chronic shortness of breath, PE, SVT, reported PAF (no documentation exists in our records) on Eliquis, presyncope, hiatal hernia, IBS, and GERD who presents for follow-up of shortness of breath, lower extremity swelling, and presyncope.  Prior R/LHC in 06/2015 showed mild diffuse disease involving the left main, 20 to 25% proximal and mid/distal RCA stenosis, mildly elevated left heart filling pressure, normal right heart filling and pulmonary arterial pressure, normal LVEF, with mildly reduced cardiac output/index.  TTE from 03/2015 showed normal LV size and function with an LVEF of 60 to 23%, grade 1 diastolic dysfunction, mild mitral regurgitation, normal RV size and systolic function, mild pulmonary hypertension.  Patient was evaluated on 04/07/2017 for routine follow-up.  At that time he reported his exertional dyspnea was slightly worse when compared to the prior year.  He also noted an episode of lightheadedness/presyncope while sitting on 04/06/2017.  This lightheadedness spontaneously resolved after approximately 30 seconds.  There were no other associated symptoms.  He also noted worsening lower extremity edema and his weight was up 9 pounds when compared to 12/2016 office visit.  Physical exam and EKG demonstrated frequent PVCs. There was concern for frequent PVCs or other transient arrhythmia could have been contributing to his near syncope.  Labs checked at that time included a magnesium of 2.3, TSH 1.570, potassium 4.2, serum creatinine 1.02.  TTE performed on 04/29/2017 showed an EF of 60 to 65%, no regional wall motion abnormalities, grade 1 diastolic dysfunction, trivial aortic regurgitation,  mild mitral regurgitation, mildly dilated left atrium, RV systolic function was normal, PASP normal, rare PVCs were noted.  He was continued on Lasix 20 mg daily as well as Eliquis given he has a history of remote PE.  He comes in accompanied by one of his daughters today.  He has not had any chest pain.  He continues to note exertional dyspnea which he feels is a little worse than when he was seen in 03/2017.  He reports previously walking approximately 4 miles daily no more recently has been limited to walking approximately half a mile daily.  He has noted at least 2 subsequent episodes of dizziness with each lasting approximately 20 to 30 seconds and spontaneously resolving.  He is unclear in his description of these events as to if they were positional or non-positional.  He does tell me today that he has had some positional dizziness though also states his dizziness can occur when he is sitting on the sofa reading or watching TV.  There are no associated palpitations, chest pain, shortness of breath, diaphoresis, nausea, or vomiting with these symptoms of dizziness.  No loss of consciousness.  He is wearing compression stockings for lower extremity swelling.  No orthopnea, PND, early satiety, cough, or worsening lower extremity swelling/abdominal distention.  He is tolerating Eliquis.  No recent falls or trauma.  He reports he would like to live long enough to see his 89th birthday and states "people are already lining up for this."   Past Medical History:  Diagnosis Date  . Cancer ([place])    skin  . Colon polyps   . Coronary artery disease excluded 06/28/15   Minimal CAD on Cardiac  Cath  . GERD (gastroesophageal reflux disease)   . Hemorrhoids   . Hiatal hernia   . IBS (irritable bowel syndrome)   . Paroxysmal supraventricular tachycardia [facility]) March 2017   First documented during episode of influenza  . Pulmonary embolism [facility])     Past Surgical History:  Procedure Laterality Date  .  CARDIAC CATHETERIZATION N/A 06/28/2015   Procedure: Left Heart Cath and Coronary Angiography;  Surgeon: Leonie Man, MD;  Location: [place] [facility];  Service: Cardiovascular;  Laterality: N/A;; proximal and mid RCA focal 20-25% lesions. Mild diffuse calcifications in the left main and proximal LAD ~30%, LVEDP 18 mmHg  . CARDIAC CATHETERIZATION  06/28/2015   Procedure: Right Heart Cath;  Surgeon: Leonie Man, MD;  Location: [place] [facility];  Service: Cardiovascular;; essentially normal right heart cath pressures. Cardiac Output 4.74.Normal wedge pressure roughly 12 mmHg.   [facility] COLON SURGERY  07/14/2013   small bowel obstruction  . SKIN SURGERY    . TRANSTHORACIC ECHOCARDIOGRAM  04/10/2015   EF 60-65%. No RWMA, GR 1 DD. Mild PA pressure elevation of 38 mmHg.    Current Meds  Medication Sig  . apixaban (ELIQUIS) 5 MG TABS tablet Take 1 tablet (5 mg total) by mouth 2 (two) times daily.  . finasteride (PROSCAR) 5 MG tablet Take 1 tablet (5 mg total) by mouth daily.  . furosemide (LASIX) 20 MG tablet Take 1 tablet (20 mg total) by mouth daily.  Marland Kitchen gabapentin (NEURONTIN) 100 MG capsule Take 100 mg by mouth 3 (three) times daily.  . metoprolol succinate (TOPROL-XL) 25 MG 24 hr tablet TAKE 0.5 TABLETS (12.5 MG TOTAL) BY MOUTH DAILY. TAKE WITH OR IMMEDIATELY FOLLOWING A MEAL.  . nitroGLYCERIN (NITROSTAT) 0.4 MG SL tablet Place 1 tablet (0.4 mg total) under the tongue every 5 (five) minutes as needed for chest pain.  . [DISCONTINUED] methylPREDNISolone (MEDROL DOSEPAK) 4 MG TBPK tablet 6 day dose pack - take as directed    Allergies:   Patient has no known allergies.   Social History:  The patient  reports that he quit smoking about 44 years ago. His smoking use included cigarettes. He has a 50.00 pack-year smoking history. He has never used smokeless tobacco. He reports that he does not drink alcohol or use drugs.   Family History:  The patient's family history includes Alzheimer's disease in  his mother; CVA in his father; Heart Problems in his sister; Heart attack in his brother.  ROS:   Review of Systems  Constitutional: Positive for malaise/fatigue. Negative for chills, diaphoresis, fever and weight loss.  HENT: Negative for congestion.   Eyes: Negative for discharge and redness.  Respiratory: Positive for shortness of breath. Negative for cough, hemoptysis, sputum production and wheezing.   Cardiovascular: Positive for leg swelling. Negative for chest pain, palpitations, orthopnea, claudication and PND.  Gastrointestinal: Negative for abdominal pain, blood in stool, heartburn, melena, nausea and vomiting.  Genitourinary: Negative for hematuria.  Musculoskeletal: Negative for falls and myalgias.  Skin: Negative for rash.  Neurological: Positive for weakness. Negative for dizziness, tingling, tremors, sensory change, speech change, focal weakness and loss of consciousness.  Endo/Heme/Allergies: Does not bruise/bleed easily.  Psychiatric/Behavioral: Negative for substance abuse. The patient is not nervous/anxious.   All other systems reviewed and are negative.    PHYSICAL EXAM:  VS:  BP 120/74 (BP Location: Left Arm, Patient Position: Sitting, Cuff Size: Normal)   Pulse 81   Ht 5\' 11"  (1.803 m)   Wt  222 lb 4 oz (100.8 kg)   BMI 31.00 kg/m  BMI: Body mass index is 31 kg/m.  Physical Exam  Constitutional: He is oriented to person, place, and time. He appears well-developed and well-nourished.  HENT:  Head: Normocephalic and atraumatic.  Eyes: Right eye exhibits no discharge. Left eye exhibits no discharge.  Neck: Normal range of motion. No JVD present.  Cardiovascular: Normal rate, S1 normal, S2 normal and normal heart sounds. An irregular rhythm present. Exam reveals no distant heart sounds, no friction rub, no midsystolic click and no opening snap.  No murmur heard. Pulses:      Posterior tibial pulses are 2+ on the right side, and 2+ on the left side.    Pulmonary/Chest: Effort normal and breath sounds normal. No respiratory distress. He has no decreased breath sounds. He has no wheezes. He has no rales. He exhibits no tenderness.  Abdominal: Soft. He exhibits no distension. There is no tenderness.  Musculoskeletal: He exhibits edema.  Trace bilateral pretibial edema  Neurological: He is alert and oriented to person, place, and time.  Skin: Skin is warm and dry. No cyanosis. Nails show no clubbing.  Psychiatric: He has a normal mood and affect. His speech is normal and behavior is normal. Judgment and thought content normal.     EKG:  Was ordered and interpreted by me today. Shows NSR with first-degree AV block, 81 bpm, occasional PVCs, left axis deviation, incomplete right bundle branch block (essentially unchanged from prior).  Approximate 30 second rhythm strip demonstrated occasional monomorphic PVCs  Recent Labs: 04/07/2017: BUN 20; Creatinine, Ser 1.02; Magnesium 2.3; Potassium 4.2; Sodium 143; TSH 1.570  No results found for requested labs within last 8760 hours.   CrCl cannot be calculated (Patient's most recent lab result is older than the maximum 21 days allowed.).   Wt Readings from Last 3 Encounters:  06/18/17 222 lb 4 oz (100.8 kg)  04/07/17 229 lb (103.9 kg)  02/23/17 228 lb (103.4 kg)     Other studies reviewed: Additional studies/records reviewed today include: summarized above  ASSESSMENT AND PLAN:  1. Exertional dyspnea: This is been a long-standing issue for him, however appears to be progressively worse over the past several months.  His weight is down 7 pounds from his last clinic visit in 03/2017.  He is not weighing himself at home.  Given that his exertional dyspnea and functional capacity are slightly worse today when compared to his March 2019 visit, we will schedule him for a Lexiscan Myoview to evaluate for high risk ischemia.  Recent TTE demonstrated preserved LV systolic function, grade 1 diastolic  dysfunction, and normal right-sided pressure.  Should his ischemic evaluation be unrevealing he may warrant pulmonology evaluation.  2. Occasional PVCs: He does not feel these.  Recent echo demonstrated preserved LV systolic function as above.  Recent labs including potassium, magnesium, and thyroid function were unrevealing.  Plan for Myoview as above to evaluate for high risk ischemia.  Schedule 24-hour Holter monitor to further quantify his ventricular ectopy.  3. Dizziness: Possibly related to orthostasis, however cannot rule out possible runs of arrhythmia.  Recommend patient place his compression stockings on first thing in the morning prior to getting out of bed and removing these prior to going to bed.  Outpatient cardiac monitoring as above.  4. Nonobstructive CAD: Schedule Myoview as above.  On Eliquis in place of aspirin.  Has not needed any sublingual nitroglycerin.  5. Reported PAF: No evidence of A. fib noted  on chart biopsy.  Remains on Eliquis nonetheless, and seems to be tolerating.  Given his history of PE we will continue apixaban at this time.  6. History of PE: Continue Eliquis as above.  7. SVT: Not aware of any palpitations.  No evidence of atrial ectopy on EKG or rhythm strip.  24-hour Holter monitor as above.  Recent labs unrevealing.  8. Essential hypertension: Blood pressure is well controlled today.  He is not checking blood pressure at home.  Continue Toprol-XL 12.5 mg daily.  Hesitant to decrease in the setting of possible orthostasis given his occasional PVCs.  Disposition: F/u with Dr. Saunders Revel or myself in approximately 8 weeks, sooner if needed.  Current medicines are reviewed at length with the patient today.  The patient did not have any concerns regarding medicines.  Signed, [name], PA-C 06/18/2017 3:06 PM     [facility] [address] [place], [place] [address] [phone]

## 2017-06-18 ENCOUNTER — Encounter: Payer: Self-pay | Admitting: Physician Assistant

## 2017-06-18 ENCOUNTER — Ambulatory Visit (INDEPENDENT_AMBULATORY_CARE_PROVIDER_SITE_OTHER): Payer: Medicare Other | Admitting: Physician Assistant

## 2017-06-18 VITALS — BP 120/74 | HR 81 | Ht 71.0 in | Wt 222.2 lb

## 2017-06-18 DIAGNOSIS — I10 Essential (primary) hypertension: Secondary | ICD-10-CM | POA: Diagnosis not present

## 2017-06-18 DIAGNOSIS — Z86711 Personal history of pulmonary embolism: Secondary | ICD-10-CM

## 2017-06-18 DIAGNOSIS — I2510 Atherosclerotic heart disease of native coronary artery without angina pectoris: Secondary | ICD-10-CM | POA: Diagnosis not present

## 2017-06-18 DIAGNOSIS — R0609 Other forms of dyspnea: Secondary | ICD-10-CM

## 2017-06-18 DIAGNOSIS — I480 Paroxysmal atrial fibrillation: Secondary | ICD-10-CM | POA: Diagnosis not present

## 2017-06-18 DIAGNOSIS — I471 Supraventricular tachycardia: Secondary | ICD-10-CM

## 2017-06-18 DIAGNOSIS — R42 Dizziness and giddiness: Secondary | ICD-10-CM

## 2017-06-18 DIAGNOSIS — I493 Ventricular premature depolarization: Secondary | ICD-10-CM

## 2017-06-18 NOTE — Patient Instructions (Addendum)
Medication Instructions: - Your physician recommends that you continue on your current medications as directed. Please refer to the Current Medication list given to you today.  Labwork: - none ordered  Procedures/Testing: - Your physician has recommended that you wear a 24 holter monitor. Holter monitors are medical devices that record the heart's electrical activity. Doctors most often use these monitors to diagnose arrhythmias. Arrhythmias are problems with the speed or rhythm of the heartbeat. The monitor is a small, portable device. You can wear one while you do your normal daily activities. This is usually used to diagnose what is causing palpitations/syncope (passing out).  - Your physician has requested that you have a lexiscan myoview. For further information please visit [email or web address].   [place]  Your caregiver has ordered a Stress Test with nuclear imaging. The purpose of this test is to evaluate the blood supply to your heart muscle. This procedure is referred to as a "Non-Invasive Stress Test." This is because other than having an IV started in your vein, nothing is inserted or "invades" your body. Cardiac stress tests are done to find areas of poor blood flow to the heart by determining the extent of coronary artery disease (CAD). Some patients exercise on a treadmill, which naturally increases the blood flow to your heart, while others who are  unable to walk on a treadmill due to physical limitations have a pharmacologic/chemical stress agent called Lexiscan . This medicine will mimic walking on a treadmill by temporarily increasing your coronary blood flow.   Please note: these test may take anywhere between 2-4 hours to complete  PLEASE REPORT TO [facility] AT THE FIRST DESK WILL DIRECT YOU WHERE TO GO  Date of Procedure:_____________________________________  Arrival Time for Procedure:______________________________  Instructions  regarding medication:   __x__:  Hold other medications as follows: Lasix (furosemide) the morning of your test   PLEASE NOTIFY THE OFFICE AT LEAST 24 HOURS IN ADVANCE IF YOU ARE UNABLE TO KEEP YOUR APPOINTMENT.  [phone] AND  PLEASE NOTIFY NUCLEAR MEDICINE AT [facility] AT LEAST 24 HOURS IN ADVANCE IF YOU ARE UNABLE TO KEEP YOUR APPOINTMENT. [phone]  How to prepare for your Myoview test:  1. Do not eat or drink after midnight 2. No caffeine for 24 hours prior to test 3. No smoking 24 hours prior to test. 4. Your medication may be taken with water.  If your doctor stopped a medication because of this test, do not take that medication. 5. Ladies, please do not wear dresses.  Skirts or pants are appropriate. Please wear a short sleeve shirt. 6. No perfume, cologne or lotion. 7. Wear comfortable walking shoes. No heels!   Follow-Up: - Your physician recommends that you schedule a follow-up appointment in: 1 month with Dr. Ernie Avena, PA   Any Additional Special Instructions Will Be Listed Below (If Applicable).     If you need a refill on your cardiac medications before your next appointment, please call your pharmacy.

## 2017-06-22 ENCOUNTER — Telehealth: Payer: Self-pay | Admitting: Internal Medicine

## 2017-06-22 ENCOUNTER — Ambulatory Visit: Payer: Medicare Other | Admitting: Podiatry

## 2017-06-22 ENCOUNTER — Ambulatory Visit (INDEPENDENT_AMBULATORY_CARE_PROVIDER_SITE_OTHER): Payer: Medicare Other

## 2017-06-22 DIAGNOSIS — I493 Ventricular premature depolarization: Secondary | ICD-10-CM | POA: Diagnosis not present

## 2017-06-22 DIAGNOSIS — R42 Dizziness and giddiness: Secondary | ICD-10-CM | POA: Diagnosis not present

## 2017-06-22 NOTE — Telephone Encounter (Signed)
Patient went to restroom this afternoon and noticed his holter monitor was off He is not sure how long it has been  Would like to know if he will need to come in to have a new one placed  Please call to discuss

## 2017-06-22 NOTE — Telephone Encounter (Signed)
S/w patient. Advised him to replace the electrode and continue wearing the monitor. We won't know how much data was missed until report is downloaded. At that time, provider can decide it another one is needed. Patient said he had already replaced the electrode and would continue wearing and bring back tomorrow.

## 2017-06-25 ENCOUNTER — Ambulatory Visit
Admission: RE | Admit: 2017-06-25 | Discharge: 2017-06-25 | Disposition: A | Discharge status: Home / Self Care | Payer: Medicare Other | Source: Ambulatory Visit | Attending: Physician Assistant | Admitting: Physician Assistant

## 2017-06-25 ENCOUNTER — Encounter (HOSPITAL_BASED_OUTPATIENT_CLINIC_OR_DEPARTMENT_OTHER)
Admission: RE | Admit: 2017-06-25 | Discharge: 2017-06-25 | Disposition: A | Discharge status: Home / Self Care | Payer: Medicare Other | Source: Ambulatory Visit | Attending: Physician Assistant | Admitting: Physician Assistant

## 2017-06-25 DIAGNOSIS — R0609 Other forms of dyspnea: Secondary | ICD-10-CM

## 2017-06-25 LAB — NM MYOCAR MULTI W/SPECT W/WALL MOTION / EF
CHL CUP MPHR: 133 {beats}/min
CHL CUP NUCLEAR SDS: 0
CHL CUP NUCLEAR SRS: 10
CHL CUP NUCLEAR SSS: 1
CSEPHR: 57 %
CSEPPHR: 76 {beats}/min
Estimated workload: 1 METS
Exercise duration (min): 0 min
Exercise duration (sec): 0 s
LVDIAVOL: 116 mL (ref 62–150)
LVSYSVOL: 40 mL
NUC STRESS TID: 1.04
Rest HR: 53 {beats}/min

## 2017-06-25 MED ORDER — TECHNETIUM TC 99M TETROFOSMIN IV KIT
33.8700 | PACK | Freq: Once | INTRAVENOUS | Status: AC | PRN
  Administered 2017-06-25: 33.87 via INTRAVENOUS

## 2017-06-25 MED ORDER — REGADENOSON 0.4 MG/5ML IV SOLN
0.4000 mg | Freq: Once | INTRAVENOUS | Status: AC
  Administered 2017-06-25: 0.4 mg via INTRAVENOUS

## 2017-06-25 MED ORDER — TECHNETIUM TC 99M TETROFOSMIN IV KIT
14.3500 | PACK | Freq: Once | INTRAVENOUS | Status: AC | PRN
  Administered 2017-06-25: 14.35 via INTRAVENOUS

## 2017-07-09 ENCOUNTER — Ambulatory Visit: Payer: Medicare Other | Admitting: Podiatry

## 2017-07-23 ENCOUNTER — Ambulatory Visit (INDEPENDENT_AMBULATORY_CARE_PROVIDER_SITE_OTHER): Payer: Medicare Other | Admitting: Podiatry

## 2017-07-23 DIAGNOSIS — M7671 Peroneal tendinitis, right leg: Secondary | ICD-10-CM | POA: Diagnosis not present

## 2017-07-23 DIAGNOSIS — M659 Synovitis and tenosynovitis, unspecified: Secondary | ICD-10-CM | POA: Diagnosis not present

## 2017-07-23 DIAGNOSIS — M65971 Unspecified synovitis and tenosynovitis, right ankle and foot: Secondary | ICD-10-CM

## 2017-07-26 NOTE — Progress Notes (Signed)
   Subjective:  84 year old male presenting today for follow up evaluation of right foot and ankle pain. He states the pain resolved for a while but returned a few weeks ago. He states the injection helped alleviate the pain temporarily. Walking increases the pain. Patient presents for further treatment and evaluation.   Past Medical History:  Diagnosis Date  . Cancer ([place])    skin  . Colon polyps   . Coronary artery disease excluded 06/28/15   Minimal CAD on Cardiac Cath  . GERD (gastroesophageal reflux disease)   . Hemorrhoids   . Hiatal hernia   . IBS (irritable bowel syndrome)   . Paroxysmal supraventricular tachycardia [facility]) March 2017   First documented during episode of influenza  . Pulmonary embolism ([place])      Objective / Physical Exam:  General:  The patient is alert and oriented x3 in no acute distress. Dermatology:  Skin is warm, dry and supple bilateral lower extremities. Negative for open lesions or macerations. Vascular:  Palpable pedal pulses bilaterally. No erythema noted. Capillary refill within normal limits. Neurological:  Epicritic and protective threshold grossly intact bilaterally.  Musculoskeletal Exam:  Pain on palpation to the anterior lateral medial aspects of the patient's right ankle. Mild edema noted. Pain on palpation to the peroneal of the right foot. Range of motion within normal limits to all pedal and ankle joints bilateral. Muscle strength 5/5 in all groups bilateral.   Assessment: #1 peroneal tendinitis right  #2 right ankle synovitis   Plan of Care:  #1 Patient was evaluated. #2 injection of 0.5 mL Celestone Soluspan injected in the patient's right ankle. #3 Injection of 0.5 mLs Celestone Soluspan injected into the peroneal tendon sheath of the right foot.  #4 Continue wearing compression anklet or ankle brace as needed.  #5 Patient is on anticoagulant therapy and cannot take oral NSAIDs.  #6 Return to clinic in 8 weeks.     Patient  walks 4 miles per day.   Edrick Kins, DPM [facility]  Dr. Edrick Kins, [address]                                        [place], [place] [address]                Office [phone]  Fax [phone]

## 2017-07-28 NOTE — Progress Notes (Signed)
Cardiology Office Note Date:  07/29/2017  Patient ID:  Jordan Figueroa, DOB 1927/11/19, MRN [id number] PCP:  Kirk Ruths, MD  Cardiologist:  Dr. Saunders Revel, MD    Chief Complaint: Follow up  History of Present Illness: Jordan Figueroa is a 84 y.o. male with history of nonobstructive CAD by [place] in 2017, chronic shortness of breath, PE, SVT, reported PAF (no documentation exists in our records) on Eliquis, presyncope, hiatal hernia, IBS, and GERD who presents for follow up of dizziness.   R/LHC in 06/2015 showed mild diffuse disease involving the left main, 20 to 25% proximal and mid/distal RCA stenosis, mildly elevated left heart filling pressure, normal right heart filling and pulmonary arterial pressure, normal LVEF, with mildly reduced cardiac output/index.  TTE from 03/2015 showed normal LV size and function with an LVEF of 60 to 41%, grade 1 diastolic dysfunction, mild mitral regurgitation, normal RV size and systolic function, mild pulmonary hypertension.  Patient was evaluated on 04/07/2017 for routine follow-up.  At that time he reported his exertional dyspnea was slightly worse when compared to the prior year.  He also noted an episode of lightheadedness/presyncope while sitting on 04/06/2017.  This lightheadedness spontaneously resolved after approximately 30 seconds.  There were no other associated symptoms.  He also noted worsening lower extremity edema and his weight was up 9 pounds when compared to 12/2016 office visit.  Physical exam and EKG demonstrated frequent PVCs. There was concern for frequent PVCs or other transient arrhythmia could have been contributing to his near syncope.  Labs checked at that time included a magnesium of 2.3, TSH 1.570, potassium 4.2, serum creatinine 1.02.  TTE performed on 04/29/2017 showed an EF of 60 to 65%, no regional wall motion abnormalities, grade 1 diastolic dysfunction, trivial aortic regurgitation, mild mitral regurgitation, mildly dilated left atrium,  RV systolic function was normal, PASP normal, rare PVCs were noted.  He was continued on Lasix 20 mg daily as well as Eliquis given he has a history of remote PE.  In follow up on 6/7, he noted slight worsening of his exertional dyspnea. He underwent a Lexiscan Myoview on 06/25/17 that was low risk with an EF of 55-65%. 24-hour Holter monitor showed an average heart rate of 67 bpm (range 49-110 bpm), longest R-R interval of 1.7 seconds, rare PACs including a 5 beat atrial run, frequent PVCs including couplets, triplets, as well as bigeminy were noted with an overall PVC burden of 8%. No sustained arrhythmia or prolonged pause was noted. Patient's reported SOB corresponded to sinus rhythm with PVCs. He was continued on metoprolol. Recent magnesium, potassium, and thyroid function from 03/2017 were unrevealing.   He comes in doing well today.  He has not had any chest pain.  He has not had any further dyspnea or dizziness.  He has had 2 episodes of palpitations since taking off his Holter monitor.  These were not associated with dizziness, presyncope, or syncope.  His lower extremity swelling has resolved.  Weight remains stable.  Blood pressure well controlled today though he is not checking this at home.  No orthopnea, abdominal distention, lower extremity swelling, PND, cough, or early satiety.  No recent falls.  No BRBPR or melena.  Tolerating medications without issues.  Past Medical History:  Diagnosis Date  . Cancer ([place])    skin  . Colon polyps   . Coronary artery disease excluded 06/28/15   Minimal CAD on Cardiac Cath  . GERD (gastroesophageal reflux disease)   . Hemorrhoids   .  Hiatal hernia   . IBS (irritable bowel syndrome)   . Paroxysmal supraventricular tachycardia [facility]) March 2017   First documented during episode of influenza  . Pulmonary embolism [facility])     Past Surgical History:  Procedure Laterality Date  . CARDIAC CATHETERIZATION N/A 06/28/2015   Procedure: Left Heart Cath and  Coronary Angiography;  Surgeon: Leonie Man, MD;  Location: [place] [facility];  Service: Cardiovascular;  Laterality: N/A;; proximal and mid RCA focal 20-25% lesions. Mild diffuse calcifications in the left main and proximal LAD ~30%, LVEDP 18 mmHg  . CARDIAC CATHETERIZATION  06/28/2015   Procedure: Right Heart Cath;  Surgeon: Leonie Man, MD;  Location: [place] [facility];  Service: Cardiovascular;; essentially normal right heart cath pressures. Cardiac Output 4.74.Normal wedge pressure roughly 12 mmHg.   Marland Kitchen COLON SURGERY  07/14/2013   small bowel obstruction  . SKIN SURGERY    . TRANSTHORACIC ECHOCARDIOGRAM  04/10/2015   EF 60-65%. No RWMA, GR 1 DD. Mild PA pressure elevation of 38 mmHg.    Current Meds  Medication Sig  . apixaban (ELIQUIS) 5 MG TABS tablet Take 1 tablet (5 mg total) by mouth 2 (two) times daily.  . finasteride (PROSCAR) 5 MG tablet Take 1 tablet (5 mg total) by mouth daily.  . furosemide (LASIX) 20 MG tablet Take 1 tablet (20 mg total) by mouth daily.  Marland Kitchen gabapentin (NEURONTIN) 100 MG capsule Take 100 mg by mouth 3 (three) times daily.  . metoprolol succinate (TOPROL-XL) 25 MG 24 hr tablet TAKE 0.5 TABLETS (12.5 MG TOTAL) BY MOUTH DAILY. TAKE WITH OR IMMEDIATELY FOLLOWING A MEAL.  . nitroGLYCERIN (NITROSTAT) 0.4 MG SL tablet Place 1 tablet (0.4 mg total) under the tongue every 5 (five) minutes as needed for chest pain.    Allergies:   Patient has no known allergies.   Social History:  The patient  reports that he quit smoking about 44 years ago. His smoking use included cigarettes. He has a 50.00 pack-year smoking history. He has never used smokeless tobacco. He reports that he does not drink alcohol or use drugs.   Family History:  The patient's family history includes Alzheimer's disease in his mother; CVA in his father; Heart Problems in his sister; Heart attack in his brother.  ROS:   Review of Systems  Constitutional: Positive for malaise/fatigue. Negative for  chills, diaphoresis, fever and weight loss.  HENT: Negative for congestion.   Eyes: Negative for discharge and redness.  Respiratory: Negative for cough, hemoptysis, sputum production, shortness of breath and wheezing.   Cardiovascular: Positive for palpitations. Negative for chest pain, orthopnea, claudication, leg swelling and PND.  Gastrointestinal: Negative for abdominal pain, blood in stool, heartburn, melena, nausea and vomiting.  Genitourinary: Negative for hematuria.  Musculoskeletal: Negative for falls and myalgias.  Skin: Negative for rash.  Neurological: Negative for dizziness, tingling, tremors, sensory change, speech change, focal weakness, loss of consciousness and weakness.  Endo/Heme/Allergies: Does not bruise/bleed easily.  Psychiatric/Behavioral: Negative for substance abuse. The patient is not nervous/anxious.   All other systems reviewed and are negative.    PHYSICAL EXAM:  VS:  BP 128/60 (BP Location: Left Arm, Patient Position: Sitting, Cuff Size: Normal)   Pulse (!) 54   Ht 5\' 11"  (1.803 m)   Wt 220 lb 8 oz (100 kg)   BMI 30.75 kg/m  BMI: Body mass index is 30.75 kg/m.  Physical Exam  Constitutional: He is oriented to person, place, and time. He appears well-developed  and well-nourished.  HENT:  Head: Normocephalic and atraumatic.  Eyes: Right eye exhibits no discharge. Left eye exhibits no discharge.  Neck: Normal range of motion. No JVD present.  Cardiovascular: Regular rhythm, S1 normal, S2 normal and normal heart sounds. Bradycardia present. Exam reveals no distant heart sounds, no friction rub, no midsystolic click and no opening snap.  No murmur heard. Pulses:      Posterior tibial pulses are 2+ on the right side, and 2+ on the left side.  Pulmonary/Chest: Effort normal and breath sounds normal. No respiratory distress. He has no decreased breath sounds. He has no wheezes. He has no rales. He exhibits no tenderness.  Abdominal: Soft. He exhibits no  distension. There is no tenderness.  Musculoskeletal: He exhibits no edema.  Neurological: He is alert and oriented to person, place, and time.  Skin: Skin is warm and dry. No cyanosis. Nails show no clubbing.  Psychiatric: He has a normal mood and affect. His speech is normal and behavior is normal. Judgment and thought content normal.     EKG:  Was ordered and interpreted by me today. Shows sinus bradycardia, 54 bpm, first-degree AV block, incomplete RBBB, poor R wave progression through the precordial leads, no acute ST-T changes (unchanged from prior)  Recent Labs: 04/07/2017: BUN 20; Creatinine, Ser 1.02; Magnesium 2.3; Potassium 4.2; Sodium 143; TSH 1.570  No results found for requested labs within last 8760 hours.   CrCl cannot be calculated (Patient's most recent lab result is older than the maximum 21 days allowed.).   Wt Readings from Last 3 Encounters:  07/29/17 220 lb 8 oz (100 kg)  06/18/17 222 lb 4 oz (100.8 kg)  04/07/17 229 lb (103.9 kg)     Other studies reviewed: Additional studies/records reviewed today include: summarized above  ASSESSMENT AND PLAN:  1. Dizziness/palpitations/PVCs: Recent Holter monitor demonstrated 8% PVC burden.  This certainly may be playing a role and some of his dizziness/palpitations.  We should also exclude other arrhythmias given his history of SVT with reported PAF.  We will place a ZIO monitor on for longer outpatient cardiac monitoring.  Continue metoprolol succinate 12.5 mg daily.  Unable to escalate therapy at this time given his bradycardia.  May need to consider antiarrhythmic therapy if symptoms persist.  2. Sinus bradycardia: Asymptomatic.  We will maintain him on Toprol-XL 12.5 mg daily given his noted palpitations, history of SVT, and PVCs.  Check ZIO monitor as above and below.  3. Exertional dyspnea: Resolved.  Recent echo and Myoview as above.  4. Nonobstructive CAD: No symptoms concerning for angina.  Recent Lexiscan Myoview  without evidence of ischemia.  On Eliquis in place of aspirin.  No plans for further ischemic evaluation at this time.  5. Reported PAF: No evidence of A. fib noted in chart biopsy or recent Holter monitor.  Remains on Eliquis nonetheless in the setting of his PE which he seems to be tolerating.  6. History of PE: Remains on Eliquis per PCP.  7. SVT: Patient has noted palpitations since taking of Holter monitor as above.  We have placed a ZIO monitor on for further evaluation of his palpitations.  Unable to escalate metoprolol at this time given his bradycardia.  8. HTN: Blood pressure well controlled today.  Continue Toprol-XL 12.5 mg daily as above.    Disposition: F/u with me in 2 months.  Current medicines are reviewed at length with the patient today.  The patient did not have any concerns regarding medicines.  Signed, [name], PA-C 07/29/2017 10:40 AM     [facility] [address] [place], [place] [address] [phone]

## 2017-07-29 ENCOUNTER — Ambulatory Visit (INDEPENDENT_AMBULATORY_CARE_PROVIDER_SITE_OTHER): Payer: Medicare Other

## 2017-07-29 ENCOUNTER — Encounter: Payer: Self-pay | Admitting: Physician Assistant

## 2017-07-29 ENCOUNTER — Ambulatory Visit (INDEPENDENT_AMBULATORY_CARE_PROVIDER_SITE_OTHER): Payer: Medicare Other | Admitting: Physician Assistant

## 2017-07-29 VITALS — BP 128/60 | HR 54 | Ht 71.0 in | Wt 220.5 lb

## 2017-07-29 DIAGNOSIS — R42 Dizziness and giddiness: Secondary | ICD-10-CM | POA: Diagnosis not present

## 2017-07-29 DIAGNOSIS — R001 Bradycardia, unspecified: Secondary | ICD-10-CM | POA: Diagnosis not present

## 2017-07-29 DIAGNOSIS — I2510 Atherosclerotic heart disease of native coronary artery without angina pectoris: Secondary | ICD-10-CM

## 2017-07-29 DIAGNOSIS — I480 Paroxysmal atrial fibrillation: Secondary | ICD-10-CM

## 2017-07-29 DIAGNOSIS — I493 Ventricular premature depolarization: Secondary | ICD-10-CM | POA: Diagnosis not present

## 2017-07-29 DIAGNOSIS — R0609 Other forms of dyspnea: Secondary | ICD-10-CM

## 2017-07-29 DIAGNOSIS — Z86711 Personal history of pulmonary embolism: Secondary | ICD-10-CM

## 2017-07-29 DIAGNOSIS — I10 Essential (primary) hypertension: Secondary | ICD-10-CM

## 2017-07-29 DIAGNOSIS — R002 Palpitations: Secondary | ICD-10-CM | POA: Diagnosis not present

## 2017-07-29 DIAGNOSIS — I471 Supraventricular tachycardia: Secondary | ICD-10-CM

## 2017-07-29 NOTE — Patient Instructions (Signed)
Testing/Procedures: Your physician has recommended that you wear an Zio monitor. Zio monitors are medical devices that record the heart's electrical activity. Doctors most often [place] these monitors to diagnose arrhythmias. Arrhythmias are problems with the speed or rhythm of the heartbeat. The monitor is a small, portable device. You can wear one while you do your normal daily activities. This is usually used to diagnose what is causing palpitations/syncope (passing out).    Follow-Up: Your physician recommends that you schedule a follow-up appointment in: 2 months with Dr. Saunders Revel or [name] PA.   It was a pleasure seeing you today here in the office. Please do not hesitate to give [place] a call back if you have any further questions. [place], BSN     Any Other Special Instructions Will Be Listed Below (If Applicable).     If you need a refill on your cardiac medications before your next appointment, please call your pharmacy.

## 2017-08-27 ENCOUNTER — Telehealth: Payer: Self-pay | Admitting: Internal Medicine

## 2017-08-27 NOTE — Telephone Encounter (Signed)
Patient daughter Jordan Figueroa calling States she would like to go over monitor results with nurse - and would like to discuss reason behind Dr. Lovena Le appointment in [place]  Please call to discuss at her work  [phone]

## 2017-08-27 NOTE — Telephone Encounter (Signed)
Called patient's daughter, ok per DPR. We discussed patient's recent monitor results and plan of care. She was unaware that the patient was having some of the symptoms such as "a gorilla sitting on his chest." She verbalized understanding of the results and agreed to moving forward with seeing Dr Lovena Le in [place]. She was very [employer].

## 2017-09-01 ENCOUNTER — Encounter: Payer: Self-pay | Admitting: Internal Medicine

## 2017-09-01 ENCOUNTER — Ambulatory Visit (INDEPENDENT_AMBULATORY_CARE_PROVIDER_SITE_OTHER): Payer: Medicare Other | Admitting: Internal Medicine

## 2017-09-01 VITALS — BP 144/70 | HR 64 | Ht 71.0 in | Wt 223.0 lb

## 2017-09-01 DIAGNOSIS — I493 Ventricular premature depolarization: Secondary | ICD-10-CM

## 2017-09-01 DIAGNOSIS — R42 Dizziness and giddiness: Secondary | ICD-10-CM

## 2017-09-01 DIAGNOSIS — R001 Bradycardia, unspecified: Secondary | ICD-10-CM

## 2017-09-01 DIAGNOSIS — I2510 Atherosclerotic heart disease of native coronary artery without angina pectoris: Secondary | ICD-10-CM | POA: Diagnosis not present

## 2017-09-01 DIAGNOSIS — R002 Palpitations: Secondary | ICD-10-CM

## 2017-09-01 MED ORDER — FUROSEMIDE 40 MG PO TABS: 40.0000 mg | ORAL_TABLET | Freq: Every day | ORAL | 3 refills | Status: DC

## 2017-09-01 NOTE — Progress Notes (Signed)
HPI Mr. Bollman is referred today by [name], PA-C for evaluation of atrial and ventricular ectopy. He is a pleasant 84 yo man with PAF, who wore a cardiac monitor which demonstrated but atrial and ventricular ectopy. He has never passed out. He does not feel palpitations. He has episodes of sob and chest pressure which are non-cardiac in nature.  He had a 2D echo with preserved LV function. No valvular abnormality. He has mild peripheral edema. He does not have palpitations. No Known Allergies   Current Outpatient Medications  Medication Sig Dispense Refill  . apixaban (ELIQUIS) 5 MG TABS tablet Take 1 tablet (5 mg total) by mouth 2 (two) times daily. 90 tablet 3  . finasteride (PROSCAR) 5 MG tablet Take 1 tablet (5 mg total) by mouth daily. 90 tablet 3  . furosemide (LASIX) 20 MG tablet Take 1 tablet (20 mg total) by mouth daily. 90 tablet 3  . gabapentin (NEURONTIN) 100 MG capsule Take 100 mg by mouth 3 (three) times daily.    . metoprolol succinate (TOPROL-XL) 25 MG 24 hr tablet Take 12.5 mg by mouth daily.    . nitroGLYCERIN (NITROSTAT) 0.4 MG SL tablet Place 1 tablet (0.4 mg total) under the tongue every 5 (five) minutes as needed for chest pain. 25 tablet 3   No current facility-administered medications for this visit.      Past Medical History:  Diagnosis Date  . Cancer ([place])    skin  . Colon polyps   . Coronary artery disease excluded 06/28/15   Minimal CAD on Cardiac Cath  . GERD (gastroesophageal reflux disease)   . Hemorrhoids   . Hiatal hernia   . IBS (irritable bowel syndrome)   . Paroxysmal supraventricular tachycardia [facility]) March 2017   First documented during episode of influenza  . Pulmonary embolism ([facility])     ROS:   All systems reviewed and negative except as noted in the HPI.   Past Surgical History:  Procedure Laterality Date  . CARDIAC CATHETERIZATION N/A 06/28/2015   Procedure: Left Heart Cath and Coronary Angiography;  Surgeon: Leonie Man, MD;  Location: [place] [facility];  Service: Cardiovascular;  Laterality: N/A;; proximal and mid RCA focal 20-25% lesions. Mild diffuse calcifications in the left main and proximal LAD ~30%, LVEDP 18 mmHg  . CARDIAC CATHETERIZATION  06/28/2015   Procedure: Right Heart Cath;  Surgeon: Leonie Man, MD;  Location: [place] [facility];  Service: Cardiovascular;; essentially normal right heart cath pressures. Cardiac Output 4.74.Normal wedge pressure roughly 12 mmHg.   Marland Kitchen COLON SURGERY  07/14/2013   small bowel obstruction  . SKIN SURGERY    . TRANSTHORACIC ECHOCARDIOGRAM  04/10/2015   EF 60-65%. No RWMA, GR 1 DD. Mild PA pressure elevation of 38 mmHg.     Family History  Problem Relation Age of Onset  . CVA Father   . Alzheimer's disease Mother   . Heart Problems Sister   . Heart attack Brother   . Prostate cancer Neg Hx   . Bladder Cancer Neg Hx   . Kidney cancer Neg Hx      Social History   Socioeconomic History  . Marital status: Widowed    Spouse name: Not on file  . Number of children: Not on file  . Years of education: Not on file  . Highest education level: Not on file  Occupational History  . Not on file  Social Needs  . Financial resource strain:  Not on file  . Food insecurity:    Worry: Not on file    Inability: Not on file  . Transportation needs:    Medical: Not on file    Non-medical: Not on file  Tobacco Use  . Smoking status: Former Smoker    Packs/day: 2.00    Years: 25.00    Pack years: 50.00    Types: Cigarettes    Last attempt to quit: 02/25/1973    Years since quitting: 44.5  . Smokeless tobacco: Never Used  Substance and Sexual Activity  . Alcohol use: No    Alcohol/week: 0.0 standard drinks  . Drug use: No  . Sexual activity: Not on file  Lifestyle  . Physical activity:    Days per week: Not on file    Minutes per session: Not on file  . Stress: Not on file  Relationships  . Social connections:    Talks on phone: Not on file     Gets together: Not on file    Attends religious service: Not on file    Active member of club or organization: Not on file    Attends meetings of clubs or organizations: Not on file    Relationship status: Not on file  . Intimate partner violence:    Fear of current or ex partner: Not on file    Emotionally abused: Not on file    Physically abused: Not on file    Forced sexual activity: Not on file  Other Topics Concern  . Not on file  Social History Narrative  . Not on file     BP (!) 144/70   Pulse 64   Ht 5\' 11"  (1.803 m)   Wt 223 lb (101.2 kg)   BMI 31.10 kg/m   Physical Exam:  Well appearing elderly man, NAD HEENT: Unremarkable Neck:  No JVD, no thyromegally Lymphatics:  No adenopathy Back:  No CVA tenderness Lungs:  Clear with no wheezes HEART:  Regular rate rhythm, no murmurs, no rubs, no clicks Abd:  soft, positive bowel sounds, no organomegally, no rebound, no guarding Ext:  2 plus pulses, no edema, no cyanosis, no clubbing Skin:  No rashes no nodules Neuro:  CN II through XII intact, motor grossly intact  EKG - NSR  Assess/Plan: 1. Atrial fib - he is minimally symptomatic. I would suggest he continue his anti-coag. He is currently in NSR. It is reasonable to continue his beta blocker. 2. Atrial and ventricular ectopy - he is not symptomatic. I see no evidence of any sustained SVT or VT. He is not symptomatic. No treatment other than his beta blocker are indicated.   Mikle Bosworth.D.

## 2017-09-01 NOTE — Patient Instructions (Addendum)
Medication Instructions:  Your physician has recommended you make the following change in your medication:  1.  Increase your lasix- Start taking 40 mg by mouth daily.  You can take 2 tablets of your 20 mg tabs until they run out.  Labwork: You will get a BMP in 2 weeks in [place]  Testing/Procedures: None ordered.  Follow-Up: Your physician wants you to follow-up in: 5-6 months with Dr. Lovena Le.   You will receive a reminder letter in the mail two months in advance. If you don't receive a letter, please call our office to schedule the follow-up appointment.  Any Other Special Instructions Will Be Listed Below (If Applicable).  If you need a refill on your cardiac medications before your next appointment, please call your pharmacy.

## 2017-09-15 ENCOUNTER — Other Ambulatory Visit
Admission: RE | Admit: 2017-09-15 | Discharge: 2017-09-15 | Disposition: A | Discharge status: Home / Self Care | Payer: Medicare Other | Source: Ambulatory Visit | Attending: Internal Medicine | Admitting: Internal Medicine

## 2017-09-15 DIAGNOSIS — I480 Paroxysmal atrial fibrillation: Secondary | ICD-10-CM | POA: Diagnosis present

## 2017-09-15 LAB — BASIC METABOLIC PANEL
ANION GAP: 8 (ref 5–15)
BUN: 20 mg/dL (ref 8–23)
CALCIUM: 8.5 mg/dL — AB (ref 8.9–10.3)
CHLORIDE: 104 mmol/L (ref 98–111)
CO2: 29 mmol/L (ref 22–32)
Creatinine, Ser: 1.03 mg/dL (ref 0.61–1.24)
GFR calc non Af Amer: >60 mL/min (ref >60)
GLUCOSE: 120 mg/dL — AB (ref 70–99)
POTASSIUM: 4 mmol/L (ref 3.5–5.1)
Sodium: 141 mmol/L (ref 135–145)

## 2017-09-15 LAB — TSH: TSH: 2.76 u[IU]/mL (ref 0.350–4.500)

## 2017-09-15 LAB — MAGNESIUM: Magnesium: 2.2 mg/dL (ref 1.7–2.4)

## 2017-09-17 ENCOUNTER — Ambulatory Visit (INDEPENDENT_AMBULATORY_CARE_PROVIDER_SITE_OTHER): Payer: Medicare Other | Admitting: Nurse Practitioner

## 2017-09-17 ENCOUNTER — Ambulatory Visit (INDEPENDENT_AMBULATORY_CARE_PROVIDER_SITE_OTHER): Payer: Medicare Other | Admitting: Podiatry

## 2017-09-17 ENCOUNTER — Encounter: Payer: Self-pay | Admitting: Nurse Practitioner

## 2017-09-17 VITALS — BP 124/64 | HR 79 | Ht 71.0 in | Wt 223.5 lb

## 2017-09-17 DIAGNOSIS — L989 Disorder of the skin and subcutaneous tissue, unspecified: Secondary | ICD-10-CM

## 2017-09-17 DIAGNOSIS — R002 Palpitations: Secondary | ICD-10-CM | POA: Diagnosis not present

## 2017-09-17 DIAGNOSIS — I471 Supraventricular tachycardia: Secondary | ICD-10-CM

## 2017-09-17 DIAGNOSIS — R0609 Other forms of dyspnea: Secondary | ICD-10-CM | POA: Diagnosis not present

## 2017-09-17 DIAGNOSIS — I493 Ventricular premature depolarization: Secondary | ICD-10-CM | POA: Diagnosis not present

## 2017-09-17 DIAGNOSIS — M7671 Peroneal tendinitis, right leg: Secondary | ICD-10-CM | POA: Diagnosis not present

## 2017-09-17 DIAGNOSIS — I2510 Atherosclerotic heart disease of native coronary artery without angina pectoris: Secondary | ICD-10-CM | POA: Diagnosis not present

## 2017-09-17 NOTE — Patient Instructions (Signed)
Medication Instructions:  Your physician recommends that you continue on your current medications as directed. Please refer to the Current Medication list given to you today.   Labwork: none  Testing/Procedures: none  Follow-Up: Your physician recommends that you schedule a follow-up appointment in: 6 MONTHS WITH DR END.   If you need a refill on your cardiac medications before your next appointment, please call your pharmacy.   

## 2017-09-17 NOTE — Progress Notes (Signed)
Office Visit    Patient Name: Jordan Figueroa Date of Encounter: 09/17/2017  Primary Care Provider:  Kirk Ruths, MD Primary Cardiologist:  Nelva Bush, MD  Chief Complaint    84 year old male with a history of nonobstructive CAD, history of PE, PSVT, reported paroxysmal atrial fibrillation, presyncope, hiatal hernia, irritable bowel syndrome, and GERD, who presents for follow-up related to PVCs and chronic dyspnea.  Past Medical History    Past Medical History:  Diagnosis Date  . Cancer ([place])    skin  . Colon polyps   . Dyspnea on exertion    a. 03/2015 Echo: Ef 60-65%, Gr1 DD, mild MR, nl RV fxn, mild PAH; b. 06/2015 RHC: minimal elevated PCWP; c.  04/2017 Echo: EF 60-65%, no rwma, GR1 DD, triv AI, mild MR, mildly dil LA, nl RV fxn, nl PASP.  Marland Kitchen GERD (gastroesophageal reflux disease)   . Hemorrhoids   . Hiatal hernia   . IBS (irritable bowel syndrome)   . Non-obstructive CAD (coronary artery disease)    a. 06/2015 Cath: LM 5, LAD 5ost, 30p, 50m, LCX nl, OM1 min irregs, OM2 nl, RCA 20p, 48m, RPDA/RPL1 min irregs, RPAV nl; b. 06/2017 Lexiscan MV: EF 55-65%, low risk.  Marland Kitchen PAF (paroxysmal atrial fibrillation) ([place])    a. Reported - no documentation in our records. Never seen on monitoring.  . Paroxysmal supraventricular tachycardia [facility]) March 2017   First documented during episode of influenza  . Pulmonary embolism ([place])   . PVC's (premature ventricular contractions)    a. 06/2017 24h Holter: Avg HR 67 (49-110), rare PACs up to 5 beat run, freq PVC's - 8% burden; b. 08/2017 Zio: Avg HR 64 (48-171), occas PACs & PVCs. 4 episodes of NSVT (4 beats longest). 48 episodes of SVT up to 12.4 secs, max rate 171.   Past Surgical History:  Procedure Laterality Date  . CARDIAC CATHETERIZATION N/A 06/28/2015   Procedure: Left Heart Cath and Coronary Angiography;  Surgeon: Leonie Man, MD;  Location: [place] [facility];  Service: Cardiovascular;  Laterality: N/A;; proximal and mid RCA  focal 20-25% lesions. Mild diffuse calcifications in the left main and proximal LAD ~30%, LVEDP 18 mmHg  . CARDIAC CATHETERIZATION  06/28/2015   Procedure: Right Heart Cath;  Surgeon: Leonie Man, MD;  Location: [place] [facility];  Service: Cardiovascular;; essentially normal right heart cath pressures. Cardiac Output 4.74.Normal wedge pressure roughly 12 mmHg.   Marland Kitchen COLON SURGERY  07/14/2013   small bowel obstruction  . SKIN SURGERY    . TRANSTHORACIC ECHOCARDIOGRAM  04/10/2015   EF 60-65%. No RWMA, GR 1 DD. Mild PA pressure elevation of 38 mmHg.    Allergies  No Known Allergies  History of Present Illness    84 year old male with above complex past medical history including chronic dyspnea, PE, reported PAF on Eliquis, presyncope, hiatal hernia, nonobstructive CAD, PSVT, PVCs/palpitations, irritable bowel syndrome, dizziness, and GERD.  In the setting of chronic dyspnea, he is previously been evaluated with diagnostic catheterization in June 2017 showing minimal nonobstructive CAD and minimally elevated right heart filling pressures.  Echocardiogram in 2017 showed normal LV function.  More recently, he was evaluated in March 2019 with dyspnea, lightheadedness, and presyncope.  He also had worsening lower extremity swelling and was noted to have frequent PVCs on exam and ECG.  Lab work was unremarkable.  Transthoracic echo in April 2019 showed normal LV function with grade 1 diastolic dysfunction.  No significant valvular abnormalities were noted.  A Lexiscan Myoview was performed in June of this year which showed normal LV function and no evidence of ischemia.  Holter monitoring showed frequent PVCs accounting for 8% burden.  He was continued on low-dose Lasix and metoprolol and when seen in July, he noted an increased frequency of palpitations with stable chronic dyspnea.  A zio monitor was placed and this subsequently showed 48 episodes of SVT up to 12.4 seconds with a maximum rate of 171 bpm.   He was referred to electrophysiology who and saw Dr. Lovena Le in August.  He recommended continuation of low-dose beta-blocker therapy.  Baseline bradycardia limits titration.  Since being seen by EP, he says he has done reasonably well.  He is chronic, stable dyspnea on exertion but is able to walk half a mile in the mall without having to take a break.  He does not typically experience chest discomfort.  When seen by EP, his Lasix was increased to 40 mg daily.  With this, he has noted some improvement in chronic mild lower extremity swelling though he has not noticed any change in breathing.  He still experience his palpitations from time to time but says they are generally brief and not that bothersome.  He denies PND, orthopnea, dizziness, syncope, edema, or early satiety.  Home Medications    Prior to Admission medications   Medication Sig Start Date End Date Taking? Authorizing Provider  apixaban (ELIQUIS) 5 MG TABS tablet Take 1 tablet (5 mg total) by mouth 2 (two) times daily. 05/07/15  Yes Leonie Man, MD  finasteride (PROSCAR) 5 MG tablet Take 1 tablet (5 mg total) by mouth daily. 01/13/17  Yes Stoioff, Ronda Fairly, MD  furosemide (LASIX) 40 MG tablet Take 1 tablet (40 mg total) by mouth daily. 09/01/17 11/30/17 Yes Evans Lance, MD  gabapentin (NEURONTIN) 100 MG capsule Take 100 mg by mouth 3 (three) times daily.   Yes [provider]  metoprolol succinate (TOPROL-XL) 25 MG 24 hr tablet Take 12.5 mg by mouth daily.   Yes [provider]  nitroGLYCERIN (NITROSTAT) 0.4 MG SL tablet Place 1 tablet (0.4 mg total) under the tongue every 5 (five) minutes as needed for chest pain. 06/05/15  Yes Leonie Man, MD    Review of Systems    Chronic dyspnea on exertion which is stable.  Lower extremity swelling improved after increased dose of Lasix.  Stable occasional palpitations.  He denies chest pain, PND, orthopnea, dizziness, syncope, edema, or early satiety.  All other  systems reviewed and are otherwise negative except as noted above.  Physical Exam    VS:  BP 124/64 (BP Location: Left Arm, Patient Position: Sitting, Cuff Size: Normal)   Pulse 79   Ht 5\' 11"  (1.803 m)   Wt 223 lb 8 oz (101.4 kg)   BMI 31.17 kg/m  , BMI Body mass index is 31.17 kg/m. GEN: Well nourished, well developed, in no acute distress. HEENT: normal. Neck: Supple, no JVD, carotid bruits, or masses. Cardiac: RRR, no murmurs, rubs, or gallops. No clubbing, cyanosis, trace bimalleolar edema.  PT 2+ and equal bilaterally.  Respiratory:  Respirations regular and unlabored, clear to auscultation bilaterally. GI: Obese, soft, nontender, nondistended, BS + x 4. MS: no deformity or atrophy. Skin: warm and dry, no rash. Neuro:  Strength and sensation are intact. Psych: Normal affect.  Accessory Clinical Findings    ECG personally reviewed by me today -regular sinus rhythm, 79, first-degree AV block, left axis deviation, incomplete right  bundle branch block, PVC- no acute changes.  Assessment & Plan    1.  Palpitations/PVCs/PSVT: Patient wore an event monitor recently that showed 48 runs of SVT, the longest of which lasted 12.4 seconds.  Previous Holter monitoring showed 8% PVC burden.  He was seen by electrophysiology with recommendation for continued medical therapy in the form of Toprol-XL 12.5 mg daily.  He has a history of bradycardia thus preventing further titration.  He has actually been doing well since his EP visit.  He occasionally notes palpitations but these are not particularly bothersome or sustained.  He has not had any recurrent dizziness or presyncope.  2.  Sinus bradycardia: Heart rate is actually 79 today.  He remains on Toprol XL 12.5 mg daily and is tolerating this well.  Average heart rate during recent monitoring period was 64 with a low of 48.  3.  Exertional dyspnea: This is chronic and stable.  Prior catheterization showed nonobstructive CAD wall recent stress  testing was nonischemic and echo showed normal LV function.  I question how far he can walk before he experiences dyspnea and he can actually walk half a mile or more prior to dyspnea.  His biggest limiting factor and walking further is actually bilateral hip pain.  4.  Nonobstructive CAD: No chest pain.  Recent nonischemic Myoview.  5.  Reported history of paroxysmal atrial fibrillation: No evidence on Holter or recent event monitoring.  He is on chronic Eliquis in the setting of PE.  6.  History of PE: Chronic Eliquis.  7.  Essential hypertension: Stable.  8.  Disposition: Follow-up in 3 months or sooner if necessary.  Murray Hodgkins, NP 09/17/2017, 4:24 PM

## 2017-09-20 NOTE — Progress Notes (Signed)
   Subjective:  84 year old male presenting today for follow up evaluation of right foot and ankle pain. He states the pain has improved significantly. He denies any modifying factors. He has been wearing the compression anklet or the ankle brace with relief. Patient presents for further treatment and evaluation.   Past Medical History:  Diagnosis Date  . Cancer ([place])    skin  . Colon polyps   . Dyspnea on exertion    a. 03/2015 Echo: Ef 60-65%, Gr1 DD, mild MR, nl RV fxn, mild PAH; b. 06/2015 RHC: minimal elevated PCWP; c.  04/2017 Echo: EF 60-65%, no rwma, GR1 DD, triv AI, mild MR, mildly dil LA, nl RV fxn, nl PASP.  Marland Kitchen GERD (gastroesophageal reflux disease)   . Hemorrhoids   . Hiatal hernia   . IBS (irritable bowel syndrome)   . Non-obstructive CAD (coronary artery disease)    a. 06/2015 Cath: LM 5, LAD 5ost, 30p, 47m, LCX nl, OM1 min irregs, OM2 nl, RCA 20p, 45m, RPDA/RPL1 min irregs, RPAV nl; b. 06/2017 Lexiscan MV: EF 55-65%, low risk.  Marland Kitchen PAF (paroxysmal atrial fibrillation) ([place])    a. Reported - no documentation in our records. Never seen on monitoring.  . Paroxysmal supraventricular tachycardia [facility]) March 2017   First documented during episode of influenza  . Pulmonary embolism ([place])    a. Chronic eliquis.  Marland Kitchen PVC's (premature ventricular contractions)    a. 06/2017 24h Holter: Avg HR 67 (49-110), rare PACs up to 5 beat run, freq PVC's - 8% burden; b. 08/2017 Zio: Avg HR 64 (48-171), occas PACs & PVCs. 4 episodes of NSVT (4 beats longest). 48 episodes of SVT up to 12.4 secs, max rate 171.     Objective / Physical Exam:  General:  The patient is alert and oriented x3 in no acute distress. Dermatology:  Skin is warm, dry and supple bilateral lower extremities. Negative for open lesions or macerations. Vascular:  Palpable pedal pulses bilaterally. No erythema noted. Capillary refill within normal limits. Neurological:  Epicritic and protective threshold grossly intact bilaterally.    Musculoskeletal Exam:  Pain on palpation to the peroneal of the right foot. Range of motion within normal limits to all pedal and ankle joints bilateral. Muscle strength 5/5 in all groups bilateral.   Assessment: #1 peroneal tendinitis right  #2 Pre-ulcerative callus lesions bilateral x 3   Plan of Care:  #1 Patient was evaluated. #2 Injection of 0.5 mLs Celestone Soluspan injected into the peroneal tendon sheath of the right foot.  #3 Continue wearing compression anklet or ankle brace as needed.  #4 Recommended good shoe gear.  #5 Excisional debridement of keratoic lesions using a chisel blade was performed without incident. Light dressing applied.  #6 Return to clinic as needed.     Patient walks 4 miles per day.   Edrick Kins, DPM [facility]  Dr. Edrick Kins, [place]                                        [place], [place] [address]                Office [phone]  Fax [phone]

## 2017-10-06 ENCOUNTER — Ambulatory Visit: Payer: Medicare Other | Admitting: Physician Assistant

## 2017-11-18 ENCOUNTER — Other Ambulatory Visit: Payer: Self-pay | Admitting: Internal Medicine

## 2017-12-21 ENCOUNTER — Ambulatory Visit (INDEPENDENT_AMBULATORY_CARE_PROVIDER_SITE_OTHER): Payer: Medicare Other | Admitting: Urology

## 2017-12-21 ENCOUNTER — Encounter: Payer: Self-pay | Admitting: Urology

## 2017-12-21 VITALS — BP 154/75 | HR 86 | Ht 71.0 in | Wt 222.6 lb

## 2017-12-21 DIAGNOSIS — N138 Other obstructive and reflux uropathy: Secondary | ICD-10-CM | POA: Diagnosis not present

## 2017-12-21 DIAGNOSIS — R351 Nocturia: Secondary | ICD-10-CM

## 2017-12-21 DIAGNOSIS — N401 Enlarged prostate with lower urinary tract symptoms: Secondary | ICD-10-CM | POA: Diagnosis not present

## 2017-12-21 LAB — URINALYSIS, COMPLETE
Bilirubin, UA: NEGATIVE
Glucose, UA: NEGATIVE
KETONES UA: NEGATIVE
LEUKOCYTES UA: NEGATIVE
Nitrite, UA: NEGATIVE
Protein, UA: NEGATIVE
SPEC GRAV UA: 1.02 (ref 1.005–1.030)
Urobilinogen, Ur: 2 mg/dL — ABNORMAL HIGH (ref 0.2–1.0)
pH, UA: 6.5 (ref 5.0–7.5)

## 2017-12-21 LAB — MICROSCOPIC EXAMINATION
Epithelial Cells (non renal): NONE SEEN /hpf (ref 0–10)
WBC, UA: NONE SEEN /hpf (ref 0–5)

## 2017-12-21 NOTE — Progress Notes (Signed)
12/21/2017 4:05 PM   Jordan Figueroa [birth year] [id number]  Referring provider: Kirk Ruths, MD [facility] [place], [place] [address]  Chief Complaint  Patient presents with  . Follow-up  . Benign Prostatic Hypertrophy   Urologic history: 1.  BPH with lower urinary tract symptoms -Finasteride 5 mg daily  2.  Elevated PSA -Biopsy 2006 PSA 6.2 with benign pathology -Discontinued PSA testing 2015 (uncorrected PSA 1.69)   HPI: 84 year old male presents for follow-up of the above problem list.  He currently has no bothersome lower urinary tract symptoms.  He has occasional urgency and intermittent urinary stream.  His voiding symptoms are not bothersome.  Denies dysuria, gross hematuria or flank/abdominal/pelvic/scrotal pain.   PMH: Past Medical History:  Diagnosis Date  . Cancer ([place])    skin  . Colon polyps   . Dyspnea on exertion    a. 03/2015 Echo: Ef 60-65%, Gr1 DD, mild MR, nl RV fxn, mild PAH; b. 06/2015 RHC: minimal elevated PCWP; c.  04/2017 Echo: EF 60-65%, no rwma, GR1 DD, triv AI, mild MR, mildly dil LA, nl RV fxn, nl PASP.  Marland Kitchen GERD (gastroesophageal reflux disease)   . Hemorrhoids   . Hiatal hernia   . IBS (irritable bowel syndrome)   . Non-obstructive CAD (coronary artery disease)    a. 06/2015 Cath: LM 5, LAD 5ost, 30p, 78m, LCX nl, OM1 min irregs, OM2 nl, RCA 20p, 26m, RPDA/RPL1 min irregs, RPAV nl; b. 06/2017 Lexiscan MV: EF 55-65%, low risk.  Marland Kitchen PAF (paroxysmal atrial fibrillation) ([place])    a. Reported - no documentation in our records. Never seen on monitoring.  . Paroxysmal supraventricular tachycardia [facility]) March 2017   First documented during episode of influenza  . Pulmonary embolism ([place])    a. Chronic eliquis.  Marland Kitchen PVC's (premature ventricular contractions)    a. 06/2017 24h Holter: Avg HR 67 (49-110), rare PACs up to 5 beat run, freq PVC's - 8% burden; b. 08/2017 Zio: Avg HR 64 (48-171), occas PACs & PVCs. 4 episodes of  NSVT (4 beats longest). 48 episodes of SVT up to 12.4 secs, max rate 171.    Surgical History: Past Surgical History:  Procedure Laterality Date  . CARDIAC CATHETERIZATION N/A 06/28/2015   Procedure: Left Heart Cath and Coronary Angiography;  Surgeon: Leonie Man, MD;  Location: [place] [facility];  Service: Cardiovascular;  Laterality: N/A;; proximal and mid RCA focal 20-25% lesions. Mild diffuse calcifications in the left main and proximal LAD ~30%, LVEDP 18 mmHg  . CARDIAC CATHETERIZATION  06/28/2015   Procedure: Right Heart Cath;  Surgeon: Leonie Man, MD;  Location: [place] [facility];  Service: Cardiovascular;; essentially normal right heart cath pressures. Cardiac Output 4.74.Normal wedge pressure roughly 12 mmHg.   Marland Kitchen COLON SURGERY  07/14/2013   small bowel obstruction  . SKIN SURGERY    . TRANSTHORACIC ECHOCARDIOGRAM  04/10/2015   EF 60-65%. No RWMA, GR 1 DD. Mild PA pressure elevation of 38 mmHg.    Home Medications:  Allergies as of 12/21/2017   No Known Allergies     Medication List        Accurate as of 12/21/17  4:05 PM. Always use your most recent med list.          apixaban 5 MG Tabs tablet Commonly known as:  ELIQUIS Take 1 tablet (5 mg total) by mouth 2 (two) times daily.   finasteride 5 MG tablet Commonly known as:  PROSCAR Take 1 tablet (5 mg total) by mouth daily.   furosemide 40 MG tablet Commonly known as:  LASIX Take 1 tablet (40 mg total) by mouth daily.   gabapentin 100 MG capsule Commonly known as:  NEURONTIN Take 100 mg by mouth 3 (three) times daily.   metoprolol succinate 25 MG 24 hr tablet Commonly known as:  TOPROL-XL Take 12.5 mg by mouth daily.   metoprolol succinate 25 MG 24 hr tablet Commonly known as:  TOPROL-XL TAKE 0.5 TABLETS (12.5 MG TOTAL) BY MOUTH DAILY. TAKE WITH OR IMMEDIATELY FOLLOWING A MEAL.   nitroGLYCERIN 0.4 MG SL tablet Commonly known as:  NITROSTAT Place 1 tablet (0.4 mg total) under the tongue every 5  (five) minutes as needed for chest pain.       Allergies: No Known Allergies  Family History: Family History  Problem Relation Age of Onset  . CVA Father   . Alzheimer's disease Mother   . Heart Problems Sister   . Heart attack Brother   . Prostate cancer Neg Hx   . Bladder Cancer Neg Hx   . Kidney cancer Neg Hx     Social History:  reports that he quit smoking about 44 years ago. His smoking use included cigarettes. He has a 50.00 pack-year smoking history. He has never used smokeless tobacco. He reports that he does not drink alcohol or use drugs.  ROS: UROLOGY Frequent Urination?: No Hard to postpone urination?: Yes Burning/pain with urination?: No Get up at night to urinate?: Yes Leakage of urine?: No Urine stream starts and stops?: Yes Trouble starting stream?: No Do you have to strain to urinate?: No Blood in urine?: No Urinary tract infection?: No Sexually transmitted disease?: No Injury to kidneys or bladder?: No Painful intercourse?: No Weak stream?: No Erection problems?: No Penile pain?: No  Gastrointestinal Nausea?: No Vomiting?: No Indigestion/heartburn?: No Diarrhea?: No Constipation?: No  Constitutional Fever: No Night sweats?: No Weight loss?: No Fatigue?: No  Skin Skin rash/lesions?: No Itching?: No  Eyes Blurred vision?: No Double vision?: No  Ears/Nose/Throat Sore throat?: No Sinus problems?: No  Hematologic/Lymphatic Swollen glands?: No Easy bruising?: Yes  Cardiovascular Leg swelling?: No Chest pain?: No  Respiratory Cough?: No Shortness of breath?: No  Endocrine Excessive thirst?: No  Musculoskeletal Back pain?: No Joint pain?: No  Neurological Headaches?: No Dizziness?: No  Psychologic Depression?: No Anxiety?: No  Physical Exam: BP (!) 154/75 (BP Location: Left Arm, Patient Position: Sitting, Cuff Size: Normal)   Pulse 86   Ht 5\' 11"  (1.803 m)   Wt 222 lb 9.6 oz (101 kg)   BMI 31.05 kg/m     Constitutional:  Alert and oriented, No acute distress. HEENT: [place] AT, moist mucus membranes.  Trachea midline, no masses. Cardiovascular: No clubbing, cyanosis, or edema. Respiratory: Normal respiratory effort, no increased work of breathing. GI: Abdomen is soft, nontender, nondistended, no abdominal masses GU: No CVA tenderness.  Prostate 45 g, smooth without nodules Lymph: No cervical or inguinal lymphadenopathy. Skin: No rashes, bruises or suspicious lesions. Neurologic: Grossly intact, no focal deficits, moving all 4 extremities. Psychiatric: Normal mood and affect.  Laboratory Data:  Urinalysis Dipstick/microscopy negative   Assessment & Plan:    1. BPH with obstruction/lower urinary tract symptoms Stable lower urinary tract symptoms on finasteride which was refilled.  Continue annual follow-up and he was instructed to call as needed for any significant symptom change.   [name], MD  [facility] [address],  [place], [place] [address] [phone]

## 2017-12-22 ENCOUNTER — Encounter: Payer: Self-pay | Admitting: Urology

## 2018-01-24 ENCOUNTER — Telehealth: Payer: Self-pay | Admitting: Urology

## 2018-01-24 DIAGNOSIS — N138 Other obstructive and reflux uropathy: Secondary | ICD-10-CM

## 2018-01-24 DIAGNOSIS — N401 Enlarged prostate with lower urinary tract symptoms: Principal | ICD-10-CM

## 2018-01-24 NOTE — Telephone Encounter (Signed)
Pt needs a refill for Finasteride and it was sent over during holidays.  He uses [facility] on [address].

## 2018-01-26 MED ORDER — FINASTERIDE 5 MG PO TABS: 5.0000 mg | ORAL_TABLET | Freq: Every day | ORAL | 3 refills | Status: DC

## 2018-01-26 NOTE — Telephone Encounter (Signed)
RX sent as pt has been seen within the last year.

## 2018-04-04 ENCOUNTER — Telehealth: Payer: Self-pay

## 2018-04-04 NOTE — Telephone Encounter (Signed)
Cardiac Questionnaire:  Since your last visit or hospitalization:  1. Have you been having chest pain? no  2. Have you been having shortness of breath? Yes, worsened over the past 3 months.  222lbs currently.  3. Have you been having increasing edema, wt gain, or increase in abdominal girth (pants fitting more tightly)? 222lbs currently.  4. Have you had any passing out spells? No  ?  COVID-19 Pre-Screening Questions:  . Have you been in contact with someone that was recently sick with fever/cough or confirmed to have the [place] virus? no  *Contact with a confirmed case should stay at home, away from confirmed patient, monitor symptoms, and reach out to PCP for e-visit/additional testing.  2. Do you have any of the following symptoms [cough, fever (100.4 or greater)], and/or shortness of breath)? No  ?  Pt did not need refills at this time.  ?  Will cancel appt and route to COVID cancel pool.

## 2018-04-05 ENCOUNTER — Telehealth: Payer: Self-pay

## 2018-04-05 NOTE — Telephone Encounter (Signed)
TELEPHONE CALL NOTE  Jordan Figueroa has been deemed a candidate for a follow-up tele-health visit to limit community exposure during the Covid-19 pandemic. I spoke with the patient via phone to ensure availability of phone/video source, confirm preferred email & phone number, discuss instructions and expectations, and review consent.   I reminded [name] to be prepared with any vital sign and/or heart rhythm information that could potentially be obtained via home monitoring, at the time of his visit.  Finally, I reminded [name] to expect an e-mail containing a link for their video-based visit approximately 15 minutes before his visit, or alternatively, a phone call at the time of his visit if his visit is planned to be a phone encounter.  Did the patient verbally consent to treatment as below? yes  [name], RMA 04/05/2018 1:23 PM  DOWNLOADING THE SOFTWARE (If applicable)  Download the [employer] app to enable video and telephone visits with your [facility] Provider.   Instructions for downloading [employer] WebEx: - Go to https://www.webex.com/downloads.html and follow the instructions - If you have technical difficulties with downloading WebEx, please call WebEx at [phone]. - Once the app is downloaded (can be done on either mobile or desktop computer), go to Settings in the upper left hand corner.  Be sure that camera and audio are enabled.  - You will receive an email message with a link to the meeting with a time to join for your tele-health visit.  - Please download the app and have settings configured prior to the appointment time.    CONSENT FOR TELE-HEALTH VISIT - PLEASE REVIEW  I hereby voluntarily request, consent and authorize [facility] and its employed or contracted physicians, physician assistants, nurse practitioners or other licensed health care professionals (the Practitioner), to provide me with telemedicine health care services (the  "Services") as deemed necessary by the treating Practitioner. I acknowledge and consent to receive the Services by the Practitioner via telemedicine. I understand that the telemedicine visit will involve communicating with the Practitioner through live audiovisual communication technology and the disclosure of certain medical information by electronic transmission. I acknowledge that I have been given the opportunity to request an in-person assessment or other available alternative prior to the telemedicine visit and am voluntarily participating in the telemedicine visit.  I understand that I have the right to withhold or withdraw my consent to the use of telemedicine in the course of my care at any time, without affecting my right to future care or treatment, and that the Practitioner or I may terminate the telemedicine visit at any time. I understand that I have the right to inspect all information obtained and/or recorded in the course of the telemedicine visit and may receive copies of available information for a reasonable fee.  I understand that some of the potential risks of receiving the Services via telemedicine include:  Marland Kitchen Delay or interruption in medical evaluation due to technological equipment failure or disruption; . Information transmitted may not be sufficient (e.g. poor resolution of images) to allow for appropriate medical decision making by the Practitioner; and/or  . In rare instances, security protocols could fail, causing a breach of personal health information.  Furthermore, I acknowledge that it is my responsibility to provide information about my medical history, conditions and care that is complete and accurate to the best of my ability. I acknowledge that Practitioner's advice, recommendations, and/or decision may be based on factors not within their control, such as incomplete or inaccurate  data provided by me or distortions of diagnostic images or specimens that may result from  electronic transmissions. I understand that the practice of medicine is not an exact science and that Practitioner makes no warranties or guarantees regarding treatment outcomes. I acknowledge that I will receive a copy of this consent concurrently upon execution via email to the email address I last provided but may also request a printed copy by calling the office of [facility].    I understand that my insurance will be billed for this visit.   I have read or had this consent read to me. . I understand the contents of this consent, which adequately explains the benefits and risks of the Services being provided via telemedicine.  . I have been provided ample opportunity to ask questions regarding this consent and the Services and have had my questions answered to my satisfaction. . I give my informed consent for the services to be provided through the use of telemedicine in my medical care  By participating in this telemedicine visit I agree to the above.

## 2018-04-05 NOTE — Progress Notes (Addendum)
Virtual Visit via Telephone Note    Evaluation Performed:  Follow-up visit  This visit type was conducted due to national recommendations for restrictions regarding the COVID-19 Pandemic (e.g. social distancing).  This format is felt to be most appropriate for this patient at this time.  All issues noted in this document were discussed and addressed.  No physical exam was performed (except for noted visual exam findings with Video Visits).  Verbal consent given by patient.  Date:  04/06/2018   ID:  Jordan Figueroa, DOB [birth year], MRN [id number]  Patient Location:  [place] [address]   Provider location:   [facility] [place] [facility] [address], [place], [place] [address]  PCP:  Kirk Ruths, MD  Cardiologist:  Nelva Bush, MD Electrophysiologist:  None   Chief Complaint: Shortness of breath and chest pain.  History of Present Illness:    Jordan Figueroa is a 85 y.o. male who presents via audio/video conferencing for a telehealth visit today.  He has a history of nonobstructive CAD, PE, PSVT, reported PAF, hiatal hernia, IBS, and GERD.  He was last seen by Ignacia Bayley, NP, in 09/2017 for follow-up of PVCs and chronic dyspnea.  He was evaluated by Dr. Lovena Le (EP) in August, who recommended continuation of low-dose beta-blocker.  Chronic dyspnea was stable at his last visit.  No medication changes were made at that time.  Today, Jordan Figueroa reports that he has noticed some increasing shortness of breath over the last few weeks.  This has prompted him to stop walking on a regular basis for 2 weeks.  A few days ago, he carried a case of soda from his car into his home and felt as though he were panting when reaching the kitchen.  He also notes occasional vague chest discomfort that last 1 to 2 minutes.  It most often happens in the morning and has not developed with activity.  He wonders if he may have some  associated palpitations but is unsure if what he is experiencing is similar to what he has had in the past with his PSVT/PAF.  He has chronic left ankle swelling for which he wears a compression stocking.  This is stable.  His weight has also been stable.  He denies orthopnea and PND.  Jordan Figueroa does not check his blood pressure/pulse regularly at home.  He reports being compliant with his medications.  He has not tried sublingual nitroglycerin.  The patient does not endorse symptoms concerning for COVID-19 infection (fever, chills, new cough (he has chronic intermittent dry cough), or new SHORTNESS OF BREATH).   Prior CV studies:   The following studies were reviewed today:  Event monitor (08/24/2017): Predominantly sinus rhythm with occasional PACs and PVCs.  48 episodes of PSVT up to 12.4 seconds.  24-hour Holter monitor (06/22/2017): This rhythm with frequent PVCs (8% burden) and rare PACs.  Single brief atrial run noted.  TTE (04/29/2017): Normal LV size and LVEF (60 to 65%) with grade 1 diastolic dysfunction.  Trivial AI and mild MR.  Mild left atrial enlargement.  Normal RV contraction.  Exercise MPI (06/07/2015): No significant ischemia.  LVEF 40%.  Rare PVCs noted.  Decreased exercise capacity (4 minutes, achieving 4.6 METS).  Past Medical History:  Diagnosis Date  . Cancer ([place])    skin  . Colon polyps   . Dyspnea on exertion    a. 03/2015 Echo: Ef 60-65%, Gr1 DD, mild  MR, nl RV fxn, mild PAH; b. 06/2015 RHC: minimal elevated PCWP; c.  04/2017 Echo: EF 60-65%, no rwma, GR1 DD, triv AI, mild MR, mildly dil LA, nl RV fxn, nl PASP.  Marland Kitchen GERD (gastroesophageal reflux disease)   . Hemorrhoids   . Hiatal hernia   . IBS (irritable bowel syndrome)   . Non-obstructive CAD (coronary artery disease)    a. 06/2015 Cath: LM 5, LAD 5ost, 30p, 11m, LCX nl, OM1 min irregs, OM2 nl, RCA 20p, 21m, RPDA/RPL1 min irregs, RPAV nl; b. 06/2017 Lexiscan MV: EF 55-65%, low risk.  Marland Kitchen PAF (paroxysmal atrial  fibrillation) ([place])    a. Reported - no documentation in our records. Never seen on monitoring.  . Paroxysmal supraventricular tachycardia [facility]) March 2017   First documented during episode of influenza  . Pulmonary embolism ([place])    a. Chronic eliquis.  Marland Kitchen PVC's (premature ventricular contractions)    a. 06/2017 24h Holter: Avg HR 67 (49-110), rare PACs up to 5 beat run, freq PVC's - 8% burden; b. 08/2017 Zio: Avg HR 64 (48-171), occas PACs & PVCs. 4 episodes of NSVT (4 beats longest). 48 episodes of SVT up to 12.4 secs, max rate 171.   Past Surgical History:  Procedure Laterality Date  . CARDIAC CATHETERIZATION N/A 06/28/2015   Procedure: Left Heart Cath and Coronary Angiography;  Surgeon: Leonie Man, MD;  Location: [place] [facility];  Service: Cardiovascular;  Laterality: N/A;; proximal and mid RCA focal 20-25% lesions. Mild diffuse calcifications in the left main and proximal LAD ~30%, LVEDP 18 mmHg  . CARDIAC CATHETERIZATION  06/28/2015   Procedure: Right Heart Cath;  Surgeon: Leonie Man, MD;  Location: [place] [facility];  Service: Cardiovascular;; essentially normal right heart cath pressures. Cardiac Output 4.74.Normal wedge pressure roughly 12 mmHg.   Marland Kitchen COLON SURGERY  07/14/2013   small bowel obstruction  . SKIN SURGERY    . TRANSTHORACIC ECHOCARDIOGRAM  04/10/2015   EF 60-65%. No RWMA, GR 1 DD. Mild PA pressure elevation of 38 mmHg.     Current Meds  Medication Sig  . apixaban (ELIQUIS) 5 MG TABS tablet Take 1 tablet (5 mg total) by mouth 2 (two) times daily.  . finasteride (PROSCAR) 5 MG tablet Take 1 tablet (5 mg total) by mouth daily.  . furosemide (LASIX) 40 MG tablet Take 1 tablet (40 mg total) by mouth daily.  Marland Kitchen gabapentin (NEURONTIN) 100 MG capsule Take 100 mg by mouth 3 (three) times daily.  . metoprolol succinate (TOPROL-XL) 25 MG 24 hr tablet Take 12.5 mg by mouth daily.  . nitroGLYCERIN (NITROSTAT) 0.4 MG SL tablet Place 1 tablet (0.4 mg total) under the tongue  every 5 (five) minutes as needed for chest pain.     Allergies:   Patient has no known allergies.   Social History   Tobacco Use  . Smoking status: Former Smoker    Packs/day: 2.00    Years: 25.00    Pack years: 50.00    Types: Cigarettes    Last attempt to quit: 02/25/1973    Years since quitting: 45.1  . Smokeless tobacco: Never Used  Substance Use Topics  . Alcohol use: No    Alcohol/week: 0.0 standard drinks  . Drug use: No     Family Hx: The patient's family history includes Alzheimer's disease in his mother; CVA in his father; Heart Problems in his sister; Heart attack in his brother. There is no history of Prostate cancer, Bladder Cancer, or  Kidney cancer.  ROS:   Please see the history of present illness.   All other systems reviewed and are negative.   Labs/Other Tests and Data Reviewed:    Recent Labs: 09/15/2017: BUN 20; Creatinine, Ser 1.03; Magnesium 2.2; Potassium 4.0; Sodium 141; TSH 2.760   Recent Lipid Panel No results found for: CHOL, TRIG, HDL, CHOLHDL, LDLCALC, LDLDIRECT  Wt Readings from Last 3 Encounters:  12/21/17 222 lb 9.6 oz (101 kg)  09/17/17 223 lb 8 oz (101.4 kg)  09/01/17 223 lb (101.2 kg)     Exam:    Vital Signs: None available.  ASSESSMENT & PLAN:    Dyspnea on exertion, atypical chest pain, and nonobstructive coronary artery disease Jordan Figueroa has a longstanding history of exertional dyspnea, though he feels like his exercise tolerance has decreased notably over the last few weeks.  He also has occasional chest pain, which is nonexertional and may be related to palpitations.  I think it would be best for him to be evaluated in the office this week so that we can perform an EKG and physical exam to better ascertain if his symptoms reflect worsening coronary insufficiency, heart failure, or other etiology.  I advised him to try taking sublingual nitroglycerin should he have an episode before his visit later this week.  No other medication  changes will be made at this time.  PSVT and PAF Continue metoprolol succinate 12.5 mg daily.  History regarding PAF remains vague, though Jordan Figueroa is still on apixaban as recommended by his PCP.  We will therefore plan to continue this for the time being.  History of pulmonary embolism Continue anticoagulation with apixaban, as managed by Dr. Ouida Sills.  COVID-19 Education: The signs and symptoms of COVID-19 were discussed with the patient and how to seek care for testing (follow up with PCP or arrange E-visit).  The importance of social distancing was discussed today.  Patient Risk:   After full review of this patients clinical status, I feel that they are at least moderate risk at this time.  Time:   Today, I have spent 16 minutes with the patient with telehealth technology discussing shortness of breath, chest pain, and COVID-19.     Medication Adjustments/Labs and Tests Ordered: Current medicines are reviewed at length with the patient today.  Concerns regarding medicines are outlined above.   Tests Ordered: None.  Medication Changes: None  Disposition:  in 2 day(s)  Signed, Nelva Bush, MD  04/06/2018 10:15 AM    [facility]

## 2018-04-05 NOTE — Telephone Encounter (Signed)
Pt scheduled for E-Visit 3/25 at 10:00

## 2018-04-06 ENCOUNTER — Telehealth (INDEPENDENT_AMBULATORY_CARE_PROVIDER_SITE_OTHER): Payer: Medicare Other | Admitting: Internal Medicine

## 2018-04-06 ENCOUNTER — Other Ambulatory Visit: Payer: Self-pay

## 2018-04-06 ENCOUNTER — Ambulatory Visit: Payer: Medicare Other | Admitting: Internal Medicine

## 2018-04-06 ENCOUNTER — Telehealth: Payer: Self-pay | Admitting: *Deleted

## 2018-04-06 DIAGNOSIS — R0609 Other forms of dyspnea: Secondary | ICD-10-CM

## 2018-04-06 DIAGNOSIS — I471 Supraventricular tachycardia: Secondary | ICD-10-CM

## 2018-04-06 DIAGNOSIS — R0789 Other chest pain: Secondary | ICD-10-CM

## 2018-04-06 DIAGNOSIS — I480 Paroxysmal atrial fibrillation: Secondary | ICD-10-CM

## 2018-04-06 DIAGNOSIS — Z86711 Personal history of pulmonary embolism: Secondary | ICD-10-CM

## 2018-04-06 DIAGNOSIS — I2510 Atherosclerotic heart disease of native coronary artery without angina pectoris: Secondary | ICD-10-CM

## 2018-04-06 NOTE — Telephone Encounter (Signed)
Patient had telephone visit with Dr End today. Dr End would like patient to come in for office visit later this week to assess. Called patient and scheduled him to come in on Friday. He verbalized understanding of appointment date and time.     COVID-19 Pre-Screening Questions:  . Do you currently have a fever? no (yes = cancel and refer to pcp for e-visit) . Have you recently travelled on a cruise, internationally, or to [place], [place], [place], [place], [place], or [place], [place] [employer]) ? no (yes = cancel, stay home, monitor symptoms, and contact pcp or initiate e-visit if symptoms develop) . Have you been in contact with someone that is currently pending confirmation of Covid19 testing or has been confirmed to have the [place] virus?  no (yes = cancel, stay home, away from tested individual, monitor symptoms, and contact pcp or initiate e-visit if symptoms develop) . Are you currently experiencing fatigue or cough? no (yes = pt should be prepared to have a mask placed at the time of their visit).

## 2018-04-06 NOTE — Progress Notes (Signed)
Follow-up Outpatient Visit Date: 04/08/2018  Primary Care Provider: Kirk Ruths, MD [facility] [place] [address]  Chief Complaint: Shortness of breath  HPI:  Jordan Figueroa is a 85 y.o. year-old male with history of nonobstructive CAD, PE, PSVT, reported PAF, hiatal hernia, IBS, and GERD, who presents for follow-up of in 09/2017.  Telemedicine visit for routine follow-up as performed 2-days ago, at which time Jordan Figueroa reported a 2-3 week history of increasing exertional dyspnea, as well as intermittent brief chest pain at rest (he has not had any chest pain with activity).  He was advised to come to the office for in person evaluation today.  Today, Jordan Figueroa reports that he continues to have exertional dyspnea that has been present for several years dating back to his PE.  He feels like his breathing has gradually gotten worse; a few years ago he was able to walk several miles while now he needs to stop after 3/4 of a mile.  He also notes chest tightness when walking briskly.  This resolves within a minute or two of stopping.  He has not had dyspnea or chest pain at rest.  He has chronic swelling of the left ankle, which is stable to slightly worse.  He usually wears a compression stocking on this leg to control his swelling.  He denies orthopnea and PND and also reports stable weight.  He has been compliant with his medications.  He has not used sublingual nitroglycerin.  --------------------------------------------------------------------------------------------------  Cardiovascular History & Procedures: Cardiovascular Problems:  Non-obstructive CAD  ? Paroxysmal atrial fibrillation  Paroxysmal SVT  Pulmonary embolism  Risk Factors:  Known CAD, age, and male gender  Cath/PCI:  LHC/RHC (06/28/15):Mild diffuse disease involving the left coronary artery. 20-25% proximal and mid/distal RCA lesions. Mildly elevated left heart filling  pressure. Normal right heart filling and pulmonary artery pressures. Normal LVEF with mildly reduced Fick cardiac output/index.  CV Surgery:  None  EP Procedures and Devices:  Event monitor (08/24/2017): Predominantly sinus rhythm with occasional PACs and PVCs.  48 episodes of PSVT up to 12.4 seconds.  24-hour Holter monitor (06/22/2017): This rhythm with frequent PVCs (8% burden) and rare PACs.  Single brief atrial run noted.  Non-Invasive Evaluation(s):  Transthoracic echocardiogram (04/29/2017): Normal LV size and LVEF (60 to 65%) with grade 1 diastolic dysfunction.  Trivial AI and mild MR.  Mild left atrial enlargement.  Normal RV contraction.  Exercise myocardial perfusion stress test (06/07/15): Low risk scan without evidence of ischemia. Apical thinning noted. LVEF reduced at 40%, a likely artifactual.  Transthoracic echocardiogram (04/10/15): Normal LV size and function with LVEF is 60-65%. Grade 1 diastolic dysfunction. Mild mitral regurgitation. Normal RV size and function. Mild pulmonary hypertension.  Recent CV Pertinent Labs: Lab Results  Component Value Date   INR 1.23 06/26/2015   BNP 87.0 04/10/2015   K 4.0 09/15/2017   K 4.1 07/17/2013   MG 2.2 09/15/2017   BUN 20 09/15/2017   BUN 20 04/07/2017   BUN 9 07/17/2013   CREATININE 1.03 09/15/2017   CREATININE 0.92 07/17/2013    Past medical and surgical history were reviewed and updated in EPIC.  Current Meds  Medication Sig   apixaban (ELIQUIS) 5 MG TABS tablet Take 1 tablet (5 mg total) by mouth 2 (two) times daily.   finasteride (PROSCAR) 5 MG tablet Take 1 tablet (5 mg total) by mouth daily.   gabapentin (NEURONTIN) 100 MG capsule Take 100  mg by mouth 3 (three) times daily.   metoprolol succinate (TOPROL-XL) 25 MG 24 hr tablet Take 12.5 mg by mouth daily.   nitroGLYCERIN (NITROSTAT) 0.4 MG SL tablet Place 1 tablet (0.4 mg total) under the tongue every 5 (five) minutes as needed for chest pain.     Allergies: Patient has no known allergies.  Social History   Tobacco Use   Smoking status: Former Smoker    Packs/day: 2.00    Years: 25.00    Pack years: 50.00    Types: Cigarettes    Last attempt to quit: 02/25/1973    Years since quitting: 45.1   Smokeless tobacco: Never Used  Substance Use Topics   Alcohol use: No    Alcohol/week: 0.0 standard drinks   Drug use: No    Family History  Problem Relation Age of Onset   CVA Father    Alzheimer's disease Mother    Heart Problems Sister    Heart attack Brother    Prostate cancer Neg Hx    Bladder Cancer Neg Hx    Kidney cancer Neg Hx     Review of Systems: A 12-system review of systems was performed and was negative except as noted in the HPI.  --------------------------------------------------------------------------------------------------  Physical Exam: BP 130/80 (BP Location: Left Arm, Patient Position: Sitting, Cuff Size: Normal)    Pulse 71    Ht 5\' 11"  (1.803 m)    Wt 217 lb (98.4 kg)    BMI 30.27 kg/m   General:  NAD. HEENT: No conjunctival pallor or scleral icterus. Moist mucous membranes.  OP clear. Neck: Supple without lymphadenopathy, thyromegaly, JVD, or HJR.  Lungs: Normal work of breathing. Clear to auscultation bilaterally without wheezes or crackles. Heart: Regular rate and rhythm without murmurs, rubs, or gallops. Non-displaced PMI. Abd: Bowel sounds present. Soft, NT/ND without hepatosplenomegaly Ext: No left lower extremity edema. 1+ left foot, ankle, and calf edema; varicose veins noted. Radial, PT, and DP pulses are 2+ bilaterally. Skin: Warm and dry without rash.  EKG:  NSR with 1st degree AV block, left axis deviation, and incomplete RBBB.  Compared with prior tracing from 09/17/2017, PVC's are no longer present.  Otherwise, there has been no significant interval change.  Lab Results  Component Value Date   WBC 6.8 12/04/2015   HGB 16.5 12/04/2015   HCT 48.0 12/04/2015   MCV  86.6 12/04/2015   PLT 187 12/04/2015    Lab Results  Component Value Date   NA 141 09/15/2017   K 4.0 09/15/2017   CL 104 09/15/2017   CO2 29 09/15/2017   BUN 20 09/15/2017   CREATININE 1.03 09/15/2017   GLUCOSE 120 (H) 09/15/2017   ALT 22 04/10/2015    No results found for: CHOL, HDL, LDLCALC, LDLDIRECT, TRIG, CHOLHDL  --------------------------------------------------------------------------------------------------  ASSESSMENT AND PLAN: Accelerating angina and dyspnea on exertion I am concerned that some of Mr. Radford symptoms may reflect worsening coronary insufficiency, though certainly HFpEF could also be contributing.  His EKG today is stable without acute ischemic changes.  We have discussed a trial of empiric antianginal therapy, non-invasive ischemia testing, and cardiac catheterization and have agreed to proceed with a pharmacologic myocardial perfusion stress test.  As long as there are no high-risk features, I would favor aggressive medical therapy; we could consider adding a long-acting nitrate.  If the stress test is high-risk, catheterization will need to be considered.  We will continue with apixaban in lieu of aspirin.  Chronic HFpEF Symptoms of exertional  dyspnea and left leg swelling could be due to a number of factors, but underlying HFpEF and/or right heart failure from prior PE are considerations.  Most recent echo less than a year ago showed preserved LVEF with grade 1 diasolic dysfunction, normal RV size and function, and normal PA pressure.  His weight is stable and exam does not suggest significant volume overload.  I suspect his chronic left leg swelling is due to venous insufficiency.  He should continue on furosemide 40 mg daily.  History of pulmonary embolism Patient remains on apixaban; will defer long-term management to Dr. Ouida Sills.  Follow-up: Virtual visit in 4-6 weeks.  Nelva Bush, MD 04/08/2018 3:13 PM

## 2018-04-08 ENCOUNTER — Ambulatory Visit (INDEPENDENT_AMBULATORY_CARE_PROVIDER_SITE_OTHER): Payer: Medicare Other | Admitting: Internal Medicine

## 2018-04-08 ENCOUNTER — Other Ambulatory Visit: Payer: Self-pay

## 2018-04-08 ENCOUNTER — Encounter: Payer: Self-pay | Admitting: Internal Medicine

## 2018-04-08 VITALS — BP 130/80 | HR 71 | Ht 71.0 in | Wt 217.0 lb

## 2018-04-08 DIAGNOSIS — I200 Unstable angina: Secondary | ICD-10-CM

## 2018-04-08 DIAGNOSIS — Z86711 Personal history of pulmonary embolism: Secondary | ICD-10-CM | POA: Diagnosis not present

## 2018-04-08 DIAGNOSIS — I5032 Chronic diastolic (congestive) heart failure: Secondary | ICD-10-CM | POA: Diagnosis not present

## 2018-04-08 DIAGNOSIS — R0609 Other forms of dyspnea: Secondary | ICD-10-CM

## 2018-04-08 NOTE — Patient Instructions (Addendum)
Medication Instructions:  Your physician recommends that you continue on your current medications as directed. Please refer to the Current Medication list given to you today.  If you need a refill on your cardiac medications before your next appointment, please call your pharmacy.   Lab work: none If you have labs (blood work) drawn today and your tests are completely normal, you will receive your results only by: Marland Kitchen MyChart Message (if you have MyChart) OR . A paper copy in the mail If you have any lab test that is abnormal or we need to change your treatment, we will call you to review the results.  Testing/Procedures: Your physician has requested that you have a lexiscan myoview. For further information please visit [email or web address]. Please follow instruction sheet, as given.  [place]  Your caregiver has ordered a Stress Test with nuclear imaging. The purpose of this test is to evaluate the blood supply to your heart muscle. This procedure is referred to as a "Non-Invasive Stress Test." This is because other than having an IV started in your vein, nothing is inserted or "invades" your body. Cardiac stress tests are done to find areas of poor blood flow to the heart by determining the extent of coronary artery disease (CAD). Some patients exercise on a treadmill, which naturally increases the blood flow to your heart, while others who are  unable to walk on a treadmill due to physical limitations have a pharmacologic/chemical stress agent called Lexiscan . This medicine will mimic walking on a treadmill by temporarily increasing your coronary blood flow.   Please note: these test may take anywhere between 2-4 hours to complete  PLEASE REPORT TO [place] AT THE FIRST DESK WILL DIRECT YOU WHERE TO GO  Date of Procedure:_____________________________________  Arrival Time for Procedure:______________________________  Instructions regarding  medication:    ____:  Hold other medications as follows:__________________FUROSEMIDE_____________  PLEASE NOTIFY THE OFFICE AT LEAST 24 HOURS IN ADVANCE IF YOU ARE UNABLE TO KEEP YOUR APPOINTMENT.  [phone] AND  PLEASE NOTIFY NUCLEAR MEDICINE AT [facility] AT LEAST 24 HOURS IN ADVANCE IF YOU ARE UNABLE TO KEEP YOUR APPOINTMENT. [phone]  How to prepare for your Myoview test:  1. Do not eat or drink after midnight 2. No caffeine for 24 hours prior to test 3. No smoking 24 hours prior to test. 4. Your medication may be taken with water.  If your doctor stopped a medication because of this test, do not take that medication. 5. Ladies, please do not wear dresses.  Skirts or pants are appropriate. Please wear a short sleeve shirt. 6. No perfume, cologne or lotion. 7. Wear comfortable walking shoes.        Follow-Up: At [facility], you and your health needs are our priority.  As part of our continuing mission to provide you with exceptional heart care, we have created designated Provider Care Teams.  These Care Teams include your primary Cardiologist (physician) and Advanced Practice Providers (APPs -  Physician Assistants and Nurse Practitioners) who all work together to provide you with the care you need, when you need it. You will need a follow up appointment in 4-6 weeks VIRTUAL/TELE OR WEBEX.  Please call our office 2 months in advance to schedule this appointment.  You may see Nelva Bush, MD or one of the following Advanced Practice Providers on your designated Care Team:   Murray Hodgkins, NP [name], PA-C Marrianne Mood, PA-C    Cardiac Nuclear Scan A  cardiac nuclear scan is a test that measures blood flow to the heart when a person is resting and when he or she is exercising. The test looks for problems such as:  Not enough blood reaching a portion of the heart.  The heart muscle not working normally. You may need this test if:  You have heart disease.   You have had abnormal lab results.  You have had heart surgery or a balloon procedure to open up blocked arteries (angioplasty).  You have chest pain.  You have shortness of breath. In this test, a radioactive dye (tracer) is injected into your bloodstream. After the tracer has traveled to your heart, an imaging device is used to measure how much of the tracer is absorbed by or distributed to various areas of your heart. This procedure is usually done at a hospital and takes 2-4 hours. Tell a health care provider about:  Any allergies you have.  All medicines you are taking, including vitamins, herbs, eye drops, creams, and over-the-counter medicines.  Any problems you or family members have had with anesthetic medicines.  Any blood disorders you have.  Any surgeries you have had.  Any medical conditions you have.  Whether you are pregnant or may be pregnant. What are the risks? Generally, this is a safe procedure. However, problems may occur, including:  Serious chest pain and heart attack. This is only a risk if the stress portion of the test is done.  Rapid heartbeat.  Sensation of warmth in your chest. This usually passes quickly.  Allergic reaction to the tracer. What happens before the procedure?  Ask your health care provider about changing or stopping your regular medicines. This is especially important if you are taking diabetes medicines or blood thinners.  Follow instructions from your health care provider about eating or drinking restrictions.  Remove your jewelry on the day of the procedure. What happens during the procedure?  An IV will be inserted into one of your veins.  Your health care provider will inject a small amount of radioactive tracer through the IV.  You will wait for 20-40 minutes while the tracer travels through your bloodstream.  Your heart activity will be monitored with an electrocardiogram (ECG).  You will lie down on an exam table.   Images of your heart will be taken for about 15-20 minutes.  You may also have a stress test. For this test, one of the following may be done: ? You will exercise on a treadmill or stationary bike. While you exercise, your heart's activity will be monitored with an ECG, and your blood pressure will be checked. ? You will be given medicines that will increase blood flow to parts of your heart. This is done if you are unable to exercise.  When blood flow to your heart has peaked, a tracer will again be injected through the IV.  After 20-40 minutes, you will get back on the exam table and have more images taken of your heart.  Depending on the type of tracer used, scans may need to be repeated 3-4 hours later.  Your IV line will be removed when the procedure is over. The procedure may vary among health care providers and hospitals. What happens after the procedure?  Unless your health care provider tells you otherwise, you may return to your normal schedule, including diet, activities, and medicines.  Unless your health care provider tells you otherwise, you may increase your fluid intake. This will help to flush the  contrast dye from your body. Drink enough fluid to keep your urine pale yellow.  Ask your health care provider, or the department that is doing the test: ? When will my results be ready? ? How will I get my results? Summary  A cardiac nuclear scan measures the blood flow to the heart when a person is resting and when he or she is exercising.  Tell your health care provider if you are pregnant.  Before the procedure, ask your health care provider about changing or stopping your regular medicines. This is especially important if you are taking diabetes medicines or blood thinners.  After the procedure, unless your health care provider tells you otherwise, increase your fluid intake. This will help flush the contrast dye from your body.  After the procedure, unless your  health care provider tells you otherwise, you may return to your normal schedule, including diet, activities, and medicines. This information is not intended to replace advice given to you by your health care provider. Make sure you discuss any questions you have with your health care provider. Document Released: 01/24/2004 Document Revised: 06/14/2017 Document Reviewed: 06/14/2017 Elsevier Interactive Patient Education  2019 [place] CARDIOLOGY TEAM HAS ARRANGED FOR AN E-VISIT FOR YOUR APPOINTMENT - PLEASE REVIEW IMPORTANT INFORMATION BELOW SEVERAL DAYS PRIOR TO YOUR APPOINTMENT  Due to the recent COVID-19 pandemic, we are transitioning in-person office visits to tele-medicine visits in an effort to decrease unnecessary exposure to our patients and staff. Medicare and most insurances are covering these visits without a copay needed. You will need a smartphone if possible. For patients that do not have these items, we can still complete the visit using a telephone but do prefer a smartphone to enable video when possible. You may have a close family member that can help. If possible, we also ask that you have a blood pressure cuff and scale at home to measure your blood pressure, heart rate and weight prior to your scheduled appointment. Patients with clinical needs that need an in-person evaluation and testing will still be able to come to the office if absolutely necessary. If you have any questions, feel free to call our office.     DOWNLOADING [place], go to App Store and type in WebEx in the search bar. [place] [employer], the blue/green circle. The app is free but as with any other app download, your phone may require you to verify saved payment information or Apple password. You do NOT have to create a WebEx account.  - If Android, go to [employer] and type in [employer] in the search bar. [place] [employer], the  blue/green circle. The app is free but as with any other app download, your phone may require you to verify saved payment information or Android password. You do NOT have to create a WebEx account.  It is very helpful to have this downloaded before your visit.    2-3 DAYS BEFORE YOUR APPOINTMENT  You will receive a telephone call from one of our [place] team members - your caller ID may say "Unknown caller." If this is a video visit, we will confirm that you have been able to download the WebEx app. We will remind you check your blood pressure, heart rate and weight prior to your scheduled appointment. If you have an Apple Watch or Kardia, please upload any pertinent ECG strips the day before or morning of  your appointment to [place]. Our staff will also make sure you have reviewed the consent and agree to move forward with your scheduled tele-health visit.     THE DAY OF YOUR APPOINTMENT  Approximately 15 minutes prior to your scheduled appointment, you will receive a telephone call from one of [place] team - your caller ID may say "Unknown caller."  Our staff will confirm medications, vital signs for the day and any symptoms you may be experiencing. Please have this information available prior to the time of visit start. It may also be helpful for you to have a pad of paper and pen handy for any instructions given during your visit. They will also walk you through joining the WebEx smartphone meeting if this is a video visit.    CONSENT FOR TELE-HEALTH VISIT - PLEASE RVIEW  I hereby voluntarily request, consent and authorize [facility] and its employed or contracted physicians, physician assistants, nurse practitioners or other licensed health care professionals (the Practitioner), to provide me with telemedicine health care services (the "Services") as deemed necessary by the treating Practitioner. I acknowledge and consent to receive the Services by the Practitioner via telemedicine.  I understand that the telemedicine visit will involve communicating with the Practitioner through live audiovisual communication technology and the disclosure of certain medical information by electronic transmission. I acknowledge that I have been given the opportunity to request an in-person assessment or other available alternative prior to the telemedicine visit and am voluntarily participating in the telemedicine visit.  I understand that I have the right to withhold or withdraw my consent to the use of telemedicine in the course of my care at any time, without affecting my right to future care or treatment, and that the Practitioner or I may terminate the telemedicine visit at any time. I understand that I have the right to inspect all information obtained and/or recorded in the course of the telemedicine visit and may receive copies of available information for a reasonable fee.  I understand that some of the potential risks of receiving the Services via telemedicine include:  Marland Kitchen Delay or interruption in medical evaluation due to technological equipment failure or disruption; . Information transmitted may not be sufficient (e.g. poor resolution of images) to allow for appropriate medical decision making by the Practitioner; and/or  . In rare instances, security protocols could fail, causing a breach of personal health information.  Furthermore, I acknowledge that it is my responsibility to provide information about my medical history, conditions and care that is complete and accurate to the best of my ability. I acknowledge that Practitioner's advice, recommendations, and/or decision may be based on factors not within their control, such as incomplete or inaccurate data provided by me or distortions of diagnostic images or specimens that may result from electronic transmissions. I understand that the practice of medicine is not an exact science and that Practitioner makes no warranties or guarantees  regarding treatment outcomes. I acknowledge that I will receive a copy of this consent concurrently upon execution via email to the email address I last provided but may also request a printed copy by calling the office of [place].    I understand that my insurance will be billed for this visit.   I have read or had this consent read to me. . I understand the contents of this consent, which adequately explains the benefits and risks of the Services being provided via telemedicine.  . I have been provided ample opportunity to ask  questions regarding this consent and the Services and have had my questions answered to my satisfaction. . I give my informed consent for the services to be provided through the use of telemedicine in my medical care  By participating in this telemedicine visit I agree to the above.

## 2018-04-09 ENCOUNTER — Encounter: Payer: Self-pay | Admitting: Internal Medicine

## 2018-04-09 DIAGNOSIS — I5033 Acute on chronic diastolic (congestive) heart failure: Secondary | ICD-10-CM | POA: Insufficient documentation

## 2018-04-09 DIAGNOSIS — I5032 Chronic diastolic (congestive) heart failure: Secondary | ICD-10-CM | POA: Insufficient documentation

## 2018-04-09 DIAGNOSIS — I200 Unstable angina: Secondary | ICD-10-CM | POA: Insufficient documentation

## 2018-04-12 ENCOUNTER — Other Ambulatory Visit: Payer: Self-pay

## 2018-04-12 ENCOUNTER — Encounter
Admission: RE | Admit: 2018-04-12 | Discharge: 2018-04-12 | Disposition: A | Discharge status: Home / Self Care | Payer: Medicare Other | Source: Ambulatory Visit | Attending: Internal Medicine | Admitting: Internal Medicine

## 2018-04-12 DIAGNOSIS — I200 Unstable angina: Secondary | ICD-10-CM | POA: Diagnosis present

## 2018-04-12 DIAGNOSIS — R0609 Other forms of dyspnea: Secondary | ICD-10-CM | POA: Insufficient documentation

## 2018-04-12 DIAGNOSIS — I5032 Chronic diastolic (congestive) heart failure: Secondary | ICD-10-CM | POA: Diagnosis present

## 2018-04-12 LAB — NM MYOCAR MULTI W/SPECT W/WALL MOTION / EF
CHL CUP MPHR: 132 {beats}/min
CHL CUP NUCLEAR SDS: 1
CSEPED: 0 min
CSEPHR: 57 %
Estimated workload: 1 METS
Exercise duration (sec): 0 s
LV sys vol: 38 mL
LVDIAVOL: 110 mL (ref 62–150)
Peak HR: 76 {beats}/min
Rest HR: 55 {beats}/min
SRS: 5
SSS: 2
TID: 1.01

## 2018-04-12 MED ORDER — TECHNETIUM TC 99M TETROFOSMIN IV KIT
10.0000 | PACK | Freq: Once | INTRAVENOUS | Status: AC | PRN
  Administered 2018-04-12: 10.37 via INTRAVENOUS

## 2018-04-12 MED ORDER — REGADENOSON 0.4 MG/5ML IV SOLN
0.4000 mg | Freq: Once | INTRAVENOUS | Status: AC
  Administered 2018-04-12: 0.4 mg via INTRAVENOUS

## 2018-04-12 MED ORDER — TECHNETIUM TC 99M TETROFOSMIN IV KIT
30.5060 | PACK | Freq: Once | INTRAVENOUS | Status: AC | PRN
  Administered 2018-04-12: 30.506 via INTRAVENOUS

## 2018-04-13 ENCOUNTER — Telehealth: Payer: Self-pay | Admitting: *Deleted

## 2018-04-13 MED ORDER — ISOSORBIDE MONONITRATE ER 30 MG PO TB24: 15.0000 mg | ORAL_TABLET | Freq: Every day | ORAL | 1 refills | Status: DC

## 2018-04-13 MED ORDER — NITROGLYCERIN 0.4 MG SL SUBL: 0.4000 mg | SUBLINGUAL_TABLET | SUBLINGUAL | 3 refills | Status: DC | PRN

## 2018-04-13 NOTE — Telephone Encounter (Signed)
-----   Message from Nelva Bush, MD sent at 04/12/2018  8:21 PM EDT ----- Please let Jordan Figueroa know that his stress test is low risk and does not show any significant abnormalities.  Though reported LVEF was moderately reduced, I suspect this artifactual.  I have reviewed the Siemens images/calculations, which show a normal LVEF.  Given his progressive symptoms, we could try adding isosorbide mononitrate 15 mg daily in case Jordan Figueroa has an element of microvascular dysfunction contributing to his symptoms.  I do not think that repeating an echo at this time is needed.  If he continues to have symptoms in spite of this, he may benefit from a pulmonary consultation in the future.  We will assess his response to isosorbide mononitrate with a virtual visit as planned in 4-6 weeks.

## 2018-04-13 NOTE — Telephone Encounter (Signed)
Results called to pt. Pt verbalized understanding. Rx sent to pharmacy.  He then asked for a refill on Nitroglycerin as well as his is out of date. States he had episode on Saturday when he had to take a Nitro which helped. However, he realized it was over a year old.   Routing to Dr End to see if ok to refill Nitro.

## 2018-04-13 NOTE — Telephone Encounter (Signed)
Refill sent to pharmacy.   

## 2018-04-13 NOTE — Telephone Encounter (Signed)
Yes, it is fine to refill sublingual nitroglycerin.  Thanks.  Nelva Bush, MD [facility] Pager: [phone]

## 2018-05-11 NOTE — Progress Notes (Addendum)
Virtual Visit via Telephone Note   This visit type was conducted due to national recommendations for restrictions regarding the COVID-19 Pandemic (e.g. social distancing) in an effort to limit this patient's exposure and mitigate transmission in our community.  Due to his co-morbid illnesses, this patient is at least at moderate risk for complications without adequate follow up.  This format is felt to be most appropriate for this patient at this time.  The patient did not have access to video technology/had technical difficulties with video requiring transitioning to audio format only (telephone).  All issues noted in this document were discussed and addressed.  No physical exam could be performed with this format.  Please refer to the patient's chart for his  consent to telehealth for [facility].   Evaluation Performed:  Follow-up visit  Date:  05/18/2018   ID:  Jordan Figueroa, DOB April 06, 1927, MRN [id number]  Patient Location: Home Provider Location: Office  PCP:  Kirk Ruths, MD  Cardiologist:  Nelva Bush, MD  Electrophysiologist:  None   Chief Complaint: Follow-up shortness of breath and chest pain  History of Present Illness:    Jordan Figueroa is a 85 y.o. male with history of nonobstructive CAD, PE, PSVT, reported PAF, hiatal hernia, IBS, and GERD.  We are speaking to for follow-up of exertional dyspnea.  I last saw him on 04/08/2018, at which time he reported gradual progression of exertional dyspnea following PE several years ago.  He also noted chest tightness with brisk walking.  Subsequent myocardial perfusion stress test did not show evidence of significant ischemia or scar.  I suggested adding isosorbide mononitrate for treatment of possible microvascular dysfunction.  Today, Jordan Figueroa reports that he is feeling better.  His chest discomfort with exertion has completely resolved since addition of isosorbide mononitrate.  He has noted a frequent headache that  improves with acetaminophen.  On further review, it turns out that he has been taking isosorbide mononitrate 30 mg daily rather than the 15 mg daily that was prescribed.  Exertional dyspnea is unchanged.  He also continues to have unilateral left leg swelling that has been present for years.  He is tolerating the remainder of his medications well.  He denies palpitations, lightheadedness, orthopnea, and PND.  He continues to walk regularly, at least 1/2 mile per day on his property.  His weight has been stable.  The patient does not have symptoms concerning for COVID-19 infection (fever, chills, cough, or new shortness of breath).    Past Medical History:  Diagnosis Date  . Cancer ([place])    skin  . Colon polyps   . Dyspnea on exertion    a. 03/2015 Echo: Ef 60-65%, Gr1 DD, mild MR, nl RV fxn, mild PAH; b. 06/2015 RHC: minimal elevated PCWP; c.  04/2017 Echo: EF 60-65%, no rwma, GR1 DD, triv AI, mild MR, mildly dil LA, nl RV fxn, nl PASP.  Marland Kitchen GERD (gastroesophageal reflux disease)   . Hemorrhoids   . Hiatal hernia   . IBS (irritable bowel syndrome)   . Non-obstructive CAD (coronary artery disease)    a. 06/2015 Cath: LM 5, LAD 5ost, 30p, 41m, LCX nl, OM1 min irregs, OM2 nl, RCA 20p, 70m, RPDA/RPL1 min irregs, RPAV nl; b. 06/2017 Lexiscan MV: EF 55-65%, low risk.  Marland Kitchen PAF (paroxysmal atrial fibrillation) ([place])    a. Reported - no documentation in our records. Never seen on monitoring.  . Paroxysmal supraventricular tachycardia [facility]) March 2017   First documented during episode  of influenza  . Pulmonary embolism ([place])    a. Chronic eliquis.  Marland Kitchen PVC's (premature ventricular contractions)    a. 06/2017 24h Holter: Avg HR 67 (49-110), rare PACs up to 5 beat run, freq PVC's - 8% burden; b. 08/2017 Zio: Avg HR 64 (48-171), occas PACs & PVCs. 4 episodes of NSVT (4 beats longest). 48 episodes of SVT up to 12.4 secs, max rate 171.   Past Surgical History:  Procedure Laterality Date  . CARDIAC CATHETERIZATION  N/A 06/28/2015   Procedure: Left Heart Cath and Coronary Angiography;  Surgeon: Leonie Man, MD;  Location: [place] CV LAB;  Service: Cardiovascular;  Laterality: N/A;; proximal and mid RCA focal 20-25% lesions. Mild diffuse calcifications in the left main and proximal LAD ~30%, LVEDP 18 mmHg  . CARDIAC CATHETERIZATION  06/28/2015   Procedure: Right Heart Cath;  Surgeon: Leonie Man, MD;  Location: [place] CV LAB;  Service: Cardiovascular;; essentially normal right heart cath pressures. Cardiac Output 4.74.Normal wedge pressure roughly 12 mmHg.   Marland Kitchen COLON SURGERY  07/14/2013   small bowel obstruction  . SKIN SURGERY    . TRANSTHORACIC ECHOCARDIOGRAM  04/10/2015   EF 60-65%. No RWMA, GR 1 DD. Mild PA pressure elevation of 38 mmHg.     Current Meds  Medication Sig  . apixaban (ELIQUIS) 5 MG TABS tablet Take 1 tablet (5 mg total) by mouth 2 (two) times daily.  . finasteride (PROSCAR) 5 MG tablet Take 1 tablet (5 mg total) by mouth daily.  . furosemide (LASIX) 40 MG tablet Take 1 tablet (40 mg total) by mouth daily.  Marland Kitchen gabapentin (NEURONTIN) 100 MG capsule Take 100 mg by mouth 3 (three) times daily.  . isosorbide mononitrate (IMDUR) 30 MG 24 hr tablet Take 0.5 tablets (15 mg total) by mouth daily. (Patient taking differently: Take 30 mg by mouth daily. )  . metoprolol succinate (TOPROL-XL) 25 MG 24 hr tablet Take 12.5 mg by mouth daily.  . nitroGLYCERIN (NITROSTAT) 0.4 MG SL tablet Place 1 tablet (0.4 mg total) under the tongue every 5 (five) minutes as needed for chest pain. Maximum of 3 doses.     Allergies:   Patient has no known allergies.   Social History   Tobacco Use  . Smoking status: Former Smoker    Packs/day: 2.00    Years: 25.00    Pack years: 50.00    Types: Cigarettes    Last attempt to quit: 02/25/1973    Years since quitting: 45.2  . Smokeless tobacco: Never Used  Substance Use Topics  . Alcohol use: No    Alcohol/week: 0.0 standard drinks  . Drug use: No      Family Hx: The patient's family history includes Alzheimer's disease in his mother; CVA in his father; Heart Problems in his sister; Heart attack in his brother. There is no history of Prostate cancer, Bladder Cancer, or Kidney cancer.  ROS:   Please see the history of present illness.   All other systems reviewed and are negative.   Prior CV studies:   The following studies were reviewed today:  Pharmacologic MPI (04/12/2018): Low risk study without significant ischemia or scar.  LVEF 40% by QGS but 65% with Siemens calculation.  Labs/Other Tests and Data Reviewed:    EKG:  No ECG reviewed.  Recent Labs: 09/15/2017: BUN 20; Creatinine, Ser 1.03; Magnesium 2.2; Potassium 4.0; Sodium 141; TSH 2.760   Recent Lipid Panel No results found for: CHOL, TRIG, HDL, CHOLHDL, LDLCALC,  LDLDIRECT  Wt Readings from Last 3 Encounters:  05/18/18 220 lb (99.8 kg)  04/08/18 217 lb (98.4 kg)  12/21/17 222 lb 9.6 oz (101 kg)     Objective:    Vital Signs:  Ht 5\' 11"  (1.803 m)   Wt 220 lb (99.8 kg)   BMI 30.68 kg/m    VITAL SIGNS:  reviewed  ASSESSMENT & PLAN:    Stable angina: Chest pain improved with addition of isosorbide mononitrate.  Interval myocardial perfusion stress test showed no evidence of significant ischemia, though certainly Jordan Figueroa could have an element of microvascular dysfunction.  We have agreed to have him take isosorbide mononitrate 15 mg daily to see if this helps improve his headache.  He should contact us if his headache does not improve or if his chest pain recurs.  We will continue metoprolol succinate 12.5 mg daily.  No other medication changes at this time.  Chronic HFpEF: Overall, exertional dyspnea and unilateral swelling of the left leg are unchanged.  We will continue furosemide and blood pressure control.  History of pulmonary embolism: Continue long-term anticoagulation with apixaban and ongoing management per Dr. Ouida Sills.  COVID-19 Education: The  signs and symptoms of COVID-19 were discussed with the patient and how to seek care for testing (follow up with PCP or arrange E-visit).  The importance of social distancing was discussed today.  Time:   Today, I have spent 10 minutes with the patient with telehealth technology discussing the above problems.  An additional 10 minutes were spent reviewing the patient's chart and documenting today's encounter.   Medication Adjustments/Labs and Tests Ordered: Current medicines are reviewed at length with the patient today.  Concerns regarding medicines are outlined above.   Tests Ordered: None.  Medication Changes: None.  Disposition:  Follow up in 6 month(s)  Signed, Nelva Bush, MD  05/18/2018 8:31 AM    [facility]

## 2018-05-18 ENCOUNTER — Other Ambulatory Visit: Payer: Self-pay

## 2018-05-18 ENCOUNTER — Encounter: Payer: Self-pay | Admitting: Internal Medicine

## 2018-05-18 ENCOUNTER — Telehealth (INDEPENDENT_AMBULATORY_CARE_PROVIDER_SITE_OTHER): Payer: Medicare Other | Admitting: Internal Medicine

## 2018-05-18 VITALS — Ht 71.0 in | Wt 220.0 lb

## 2018-05-18 DIAGNOSIS — Z86711 Personal history of pulmonary embolism: Secondary | ICD-10-CM | POA: Diagnosis not present

## 2018-05-18 DIAGNOSIS — I208 Other forms of angina pectoris: Secondary | ICD-10-CM | POA: Diagnosis not present

## 2018-05-18 DIAGNOSIS — I5032 Chronic diastolic (congestive) heart failure: Secondary | ICD-10-CM

## 2018-05-18 NOTE — Patient Instructions (Signed)
Medication Instructions:  Your physician has recommended you make the following change in your medication:  1- MAKE SURE TO take Imdur 15 mg by mouth once a day.  If you need a refill on your cardiac medications before your next appointment, please call your pharmacy.   Lab work: none If you have labs (blood work) drawn today and your tests are completely normal, you will receive your results only by: Marland Kitchen MyChart Message (if you have MyChart) OR . A paper copy in the mail If you have any lab test that is abnormal or we need to change your treatment, we will call you to review the results.  Testing/Procedures: none  Follow-Up: At [facility], you and your health needs are our priority.  As part of our continuing mission to provide you with exceptional heart care, we have created designated Provider Care Teams.  These Care Teams include your primary Cardiologist (physician) and Advanced Practice Providers (APPs -  Physician Assistants and Nurse Practitioners) who all work together to provide you with the care you need, when you need it. You will need a follow up appointment in 6 months.  Please call our office 2 months in advance to schedule this appointment.  You may see Nelva Bush, MD or one of the following Advanced Practice Providers on your designated Care Team:   Murray Hodgkins, NP [name], PA-C . Marrianne Mood, PA-C  Any Other Special Instructions Will Be Listed Below (If Applicable).  Please let us know if you're headache does not get better with the decreased dose of Imdur.

## 2018-05-24 ENCOUNTER — Telehealth: Payer: Self-pay | Admitting: Internal Medicine

## 2018-05-24 ENCOUNTER — Other Ambulatory Visit: Payer: Self-pay

## 2018-05-24 MED ORDER — ISOSORBIDE MONONITRATE ER 30 MG PO TB24: 15.0000 mg | ORAL_TABLET | Freq: Every day | ORAL | 0 refills | Status: DC

## 2018-05-24 NOTE — Telephone Encounter (Signed)
Pt c/o medication issue:  1. Name of Medication: isosorbide 15 mg po q d   2. How are you currently taking this medication (dosage and times per day)? 15 mg po q d   3. Are you having a reaction (difficulty breathing--STAT)? No   4. What is your medication issue? Patient was taking 30 per day spoke with md and discovered taking wrong.  Pharmacy doesn't want to fill too soon.  Please send in new rx

## 2018-05-24 NOTE — Telephone Encounter (Signed)
Medication resent.   isosorbide mononitrate (IMDUR) 30 MG 24 hr tablet 45 tablet 0 05/24/2018 08/22/2018   Sig - Route: Take 0.5 tablets (15 mg total) by mouth daily. - Oral   Sent to pharmacy as: isosorbide mononitrate (IMDUR) 30 MG 24 hr tablet   E-Prescribing Status: Receipt confirmed by pharmacy (05/24/2018 11:24 AM EDT)   Pharmacy   [id number] [address], [place]

## 2018-07-28 ENCOUNTER — Observation Stay (HOSPITAL_BASED_OUTPATIENT_CLINIC_OR_DEPARTMENT_OTHER)
Admit: 2018-07-28 | Discharge: 2018-07-28 | Disposition: A | Discharge status: Home / Self Care | Payer: Medicare Other | Attending: Cardiovascular Disease | Admitting: Cardiovascular Disease

## 2018-07-28 ENCOUNTER — Observation Stay
Admission: EM | Admit: 2018-07-28 | Discharge: 2018-07-29 | Disposition: A | Discharge status: Home / Self Care | Payer: Medicare Other | Attending: Internal Medicine | Admitting: Internal Medicine

## 2018-07-28 ENCOUNTER — Emergency Department: Payer: Medicare Other

## 2018-07-28 ENCOUNTER — Other Ambulatory Visit: Payer: Self-pay

## 2018-07-28 DIAGNOSIS — Z79899 Other long term (current) drug therapy: Secondary | ICD-10-CM | POA: Insufficient documentation

## 2018-07-28 DIAGNOSIS — Z7901 Long term (current) use of anticoagulants: Secondary | ICD-10-CM | POA: Insufficient documentation

## 2018-07-28 DIAGNOSIS — Z86711 Personal history of pulmonary embolism: Secondary | ICD-10-CM | POA: Diagnosis not present

## 2018-07-28 DIAGNOSIS — I4510 Unspecified right bundle-branch block: Secondary | ICD-10-CM | POA: Diagnosis not present

## 2018-07-28 DIAGNOSIS — I340 Nonrheumatic mitral (valve) insufficiency: Secondary | ICD-10-CM | POA: Insufficient documentation

## 2018-07-28 DIAGNOSIS — N401 Enlarged prostate with lower urinary tract symptoms: Secondary | ICD-10-CM | POA: Insufficient documentation

## 2018-07-28 DIAGNOSIS — K449 Diaphragmatic hernia without obstruction or gangrene: Secondary | ICD-10-CM | POA: Diagnosis not present

## 2018-07-28 DIAGNOSIS — I25110 Atherosclerotic heart disease of native coronary artery with unstable angina pectoris: Secondary | ICD-10-CM | POA: Diagnosis not present

## 2018-07-28 DIAGNOSIS — I471 Supraventricular tachycardia: Secondary | ICD-10-CM | POA: Insufficient documentation

## 2018-07-28 DIAGNOSIS — R1310 Dysphagia, unspecified: Secondary | ICD-10-CM | POA: Diagnosis not present

## 2018-07-28 DIAGNOSIS — I440 Atrioventricular block, first degree: Secondary | ICD-10-CM | POA: Diagnosis not present

## 2018-07-28 DIAGNOSIS — K589 Irritable bowel syndrome without diarrhea: Secondary | ICD-10-CM | POA: Diagnosis not present

## 2018-07-28 DIAGNOSIS — I25119 Atherosclerotic heart disease of native coronary artery with unspecified angina pectoris: Secondary | ICD-10-CM | POA: Diagnosis present

## 2018-07-28 DIAGNOSIS — I5032 Chronic diastolic (congestive) heart failure: Secondary | ICD-10-CM | POA: Diagnosis not present

## 2018-07-28 DIAGNOSIS — I444 Left anterior fascicular block: Secondary | ICD-10-CM | POA: Diagnosis not present

## 2018-07-28 DIAGNOSIS — Z87891 Personal history of nicotine dependence: Secondary | ICD-10-CM | POA: Insufficient documentation

## 2018-07-28 DIAGNOSIS — R0609 Other forms of dyspnea: Secondary | ICD-10-CM | POA: Diagnosis not present

## 2018-07-28 DIAGNOSIS — K219 Gastro-esophageal reflux disease without esophagitis: Secondary | ICD-10-CM | POA: Insufficient documentation

## 2018-07-28 DIAGNOSIS — I480 Paroxysmal atrial fibrillation: Secondary | ICD-10-CM | POA: Diagnosis not present

## 2018-07-28 DIAGNOSIS — R0602 Shortness of breath: Secondary | ICD-10-CM | POA: Insufficient documentation

## 2018-07-28 DIAGNOSIS — I2510 Atherosclerotic heart disease of native coronary artery without angina pectoris: Secondary | ICD-10-CM | POA: Diagnosis present

## 2018-07-28 DIAGNOSIS — I200 Unstable angina: Secondary | ICD-10-CM | POA: Diagnosis present

## 2018-07-28 DIAGNOSIS — Z1159 Encounter for screening for other viral diseases: Secondary | ICD-10-CM | POA: Insufficient documentation

## 2018-07-28 DIAGNOSIS — I2782 Chronic pulmonary embolism: Secondary | ICD-10-CM | POA: Insufficient documentation

## 2018-07-28 DIAGNOSIS — I493 Ventricular premature depolarization: Secondary | ICD-10-CM | POA: Diagnosis not present

## 2018-07-28 LAB — ECHOCARDIOGRAM COMPLETE
Height: 71 in
Weight: 3355.2 oz

## 2018-07-28 LAB — CBC
HCT: 45.5 % (ref 39.0–52.0)
Hemoglobin: 15.4 g/dL (ref 13.0–17.0)
MCH: 29.2 pg (ref 26.0–34.0)
MCHC: 33.8 g/dL (ref 30.0–36.0)
MCV: 86.3 fL (ref 80.0–100.0)
Platelets: 194 10*3/uL (ref 150–400)
RBC: 5.27 MIL/uL (ref 4.22–5.81)
RDW: 13.4 % (ref 11.5–15.5)
WBC: 8.6 10*3/uL (ref 4.0–10.5)
nRBC: 0 % (ref 0.0–0.2)

## 2018-07-28 LAB — LIPID PANEL
Cholesterol: 109 mg/dL (ref 0–200)
HDL: 38 mg/dL — ABNORMAL LOW (ref >40)
LDL Cholesterol: 61 mg/dL (ref 0–99)
Total CHOL/HDL Ratio: 2.9 RATIO
Triglycerides: 48 mg/dL (ref <150)
VLDL: 10 mg/dL (ref 0–40)

## 2018-07-28 LAB — BASIC METABOLIC PANEL
Anion gap: 10 (ref 5–15)
BUN: 16 mg/dL (ref 8–23)
CO2: 24 mmol/L (ref 22–32)
Calcium: 8.5 mg/dL — ABNORMAL LOW (ref 8.9–10.3)
Chloride: 102 mmol/L (ref 98–111)
Creatinine, Ser: 1.12 mg/dL (ref 0.61–1.24)
GFR calc Af Amer: >60 mL/min (ref >60)
GFR calc non Af Amer: 58 mL/min — ABNORMAL LOW (ref >60)
Glucose, Bld: 156 mg/dL — ABNORMAL HIGH (ref 70–99)
Potassium: 3.9 mmol/L (ref 3.5–5.1)
Sodium: 136 mmol/L (ref 135–145)

## 2018-07-28 LAB — TROPONIN I (HIGH SENSITIVITY)
Troponin I (High Sensitivity): 23 ng/L — ABNORMAL HIGH (ref <18)
Troponin I (High Sensitivity): 18 ng/L — ABNORMAL HIGH (ref <18)
Troponin I (High Sensitivity): 36 ng/L — ABNORMAL HIGH (ref <18)
Troponin I (High Sensitivity): 38 ng/L — ABNORMAL HIGH (ref <18)

## 2018-07-28 LAB — SARS CORONAVIRUS 2 BY RT PCR (HOSPITAL ORDER, PERFORMED IN ~~LOC~~ HOSPITAL LAB): SARS Coronavirus 2: NEGATIVE

## 2018-07-28 MED ORDER — METOPROLOL SUCCINATE ER 25 MG PO TB24
12.5000 mg | ORAL_TABLET | Freq: Every day | ORAL | Status: DC
  Administered 2018-07-29: 12.5 mg via ORAL
  Filled 2018-07-28: qty 1

## 2018-07-28 MED ORDER — PERFLUTREN LIPID MICROSPHERE
1.0000 mL | INTRAVENOUS | Status: AC | PRN
  Administered 2018-07-28: 6.5 mL via INTRAVENOUS
  Filled 2018-07-28: qty 10

## 2018-07-28 MED ORDER — SODIUM CHLORIDE 0.9% FLUSH
3.0000 mL | Freq: Once | INTRAVENOUS | Status: AC
  Administered 2018-07-28: 3 mL via INTRAVENOUS

## 2018-07-28 MED ORDER — FINASTERIDE 5 MG PO TABS
5.0000 mg | ORAL_TABLET | Freq: Every day | ORAL | Status: DC
  Administered 2018-07-28: 17:00:00 5 mg via ORAL
  Filled 2018-07-28: qty 1

## 2018-07-28 MED ORDER — ISOSORBIDE MONONITRATE ER 30 MG PO TB24
15.0000 mg | ORAL_TABLET | Freq: Every day | ORAL | Status: DC
  Administered 2018-07-29: 09:00:00 15 mg via ORAL
  Filled 2018-07-28: qty 1

## 2018-07-28 MED ORDER — NITROGLYCERIN 2 % TD OINT
1.0000 [in_us] | TOPICAL_OINTMENT | Freq: Once | TRANSDERMAL | Status: DC
  Filled 2018-07-28: qty 1

## 2018-07-28 MED ORDER — ACETAMINOPHEN 325 MG PO TABS: 650.0000 mg | ORAL_TABLET | ORAL | Status: DC | PRN

## 2018-07-28 MED ORDER — APIXABAN 5 MG PO TABS
5.0000 mg | ORAL_TABLET | Freq: Two times a day (BID) | ORAL | Status: DC
  Administered 2018-07-28 – 2018-07-29 (×2): 5 mg via ORAL
  Filled 2018-07-28 (×2): qty 1

## 2018-07-28 MED ORDER — GABAPENTIN 100 MG PO CAPS
100.0000 mg | ORAL_CAPSULE | Freq: Three times a day (TID) | ORAL | Status: DC
  Administered 2018-07-28 – 2018-07-29 (×3): 100 mg via ORAL
  Filled 2018-07-28 (×3): qty 1

## 2018-07-28 MED ORDER — FUROSEMIDE 40 MG PO TABS
40.0000 mg | ORAL_TABLET | Freq: Every day | ORAL | Status: DC
  Administered 2018-07-29: 40 mg via ORAL
  Filled 2018-07-28: qty 1

## 2018-07-28 MED ORDER — ONDANSETRON HCL 4 MG/2ML IJ SOLN: 4.0000 mg | Freq: Four times a day (QID) | INTRAMUSCULAR | Status: DC | PRN

## 2018-07-28 NOTE — ED Notes (Signed)
Rainbow was drawn and sent to lab. ?

## 2018-07-28 NOTE — H&P (Signed)
[place] [facility] NAME: Jordan Figueroa    MR#:  [id number]  DATE OF BIRTH:  08/23/1927  DATE OF ADMISSION:  07/28/2018  PRIMARY CARE PHYSICIAN: Kirk Ruths, MD   REQUESTING/REFERRING PHYSICIAN: Earleen Newport, MD  CHIEF COMPLAINT:   Chief Complaint  Patient presents with  . Chest Pain    HISTORY OF PRESENT ILLNESS:  Jordan Figueroa  is a 85 y.o. male with a known history of GERD, IBS, atrial fibrillation, SVT, pulmonary embolism, PVCs  is being admitted for substernal chest pain with shortness of breath.  Pain was sudden onset this morning while sitting drinking coffee.  He did take 1 sublingual nitroglycerin with some improvement in his pain.  Pain is 4 out of 10.  Patient had some sweats and nausea with the onset of pain.  PAST MEDICAL HISTORY:   Past Medical History:  Diagnosis Date  . Cancer ([place])    skin  . Colon polyps   . Dyspnea on exertion    a. 03/2015 Echo: Ef 60-65%, Gr1 DD, mild MR, nl RV fxn, mild PAH; b. 06/2015 RHC: minimal elevated PCWP; c.  04/2017 Echo: EF 60-65%, no rwma, GR1 DD, triv AI, mild MR, mildly dil LA, nl RV fxn, nl PASP.  Marland Kitchen GERD (gastroesophageal reflux disease)   . Hemorrhoids   . Hiatal hernia   . IBS (irritable bowel syndrome)   . Non-obstructive CAD (coronary artery disease)    a. 06/2015 Cath: LM 5, LAD 5ost, 30p, 23m, LCX nl, OM1 min irregs, OM2 nl, RCA 20p, 68m, RPDA/RPL1 min irregs, RPAV nl; b. 06/2017 Lexiscan MV: EF 55-65%, low risk.  Marland Kitchen PAF (paroxysmal atrial fibrillation) ([place])    a. Reported - no documentation in our records. Never seen on monitoring.  . Paroxysmal supraventricular tachycardia [facility]) March 2017   First documented during episode of influenza  . Pulmonary embolism ([place])    a. Chronic eliquis.  Marland Kitchen PVC's (premature ventricular contractions)    a. 06/2017 24h Holter: Avg HR 67 (49-110), rare PACs up to 5 beat run, freq PVC's - 8% burden; b. 08/2017 Zio: Avg HR 64  (48-171), occas PACs & PVCs. 4 episodes of NSVT (4 beats longest). 48 episodes of SVT up to 12.4 secs, max rate 171.    PAST SURGICAL HISTORY:   Past Surgical History:  Procedure Laterality Date  . CARDIAC CATHETERIZATION N/A 06/28/2015   Procedure: Left Heart Cath and Coronary Angiography;  Surgeon: Leonie Man, MD;  Location: [place] CV LAB;  Service: Cardiovascular;  Laterality: N/A;; proximal and mid RCA focal 20-25% lesions. Mild diffuse calcifications in the left main and proximal LAD ~30%, LVEDP 18 mmHg  . CARDIAC CATHETERIZATION  06/28/2015   Procedure: Right Heart Cath;  Surgeon: Leonie Man, MD;  Location: [place] CV LAB;  Service: Cardiovascular;; essentially normal right heart cath pressures. Cardiac Output 4.74.Normal wedge pressure roughly 12 mmHg.   Marland Kitchen COLON SURGERY  07/14/2013   small bowel obstruction  . SKIN SURGERY    . TRANSTHORACIC ECHOCARDIOGRAM  04/10/2015   EF 60-65%. No RWMA, GR 1 DD. Mild PA pressure elevation of 38 mmHg.    SOCIAL HISTORY:   Social History   Tobacco Use  . Smoking status: Former Smoker    Packs/day: 2.00    Years: 25.00    Pack years: 50.00    Types: Cigarettes    Quit date: 02/25/1973    Years since quitting: 45.4  .  Smokeless tobacco: Never Used  Substance Use Topics  . Alcohol use: No    Alcohol/week: 0.0 standard drinks    FAMILY HISTORY:   Family History  Problem Relation Age of Onset  . CVA Father   . Alzheimer's disease Mother   . Heart Problems Sister   . Heart attack Brother   . Prostate cancer Neg Hx   . Bladder Cancer Neg Hx   . Kidney cancer Neg Hx     DRUG ALLERGIES:  No Known Allergies  REVIEW OF SYSTEMS:   Review of Systems  Constitutional: Negative for diaphoresis, fever, malaise/fatigue and weight loss.  HENT: Negative for ear discharge, ear pain, hearing loss, nosebleeds, sore throat and tinnitus.   Eyes: Negative for blurred vision and pain.  Respiratory: Positive for shortness of  breath. Negative for cough, hemoptysis and wheezing.   Cardiovascular: Positive for chest pain. Negative for palpitations, orthopnea and leg swelling.  Gastrointestinal: Negative for abdominal pain, blood in stool, constipation, diarrhea, heartburn, nausea and vomiting.  Genitourinary: Negative for dysuria, frequency and urgency.  Musculoskeletal: Negative for back pain and myalgias.  Skin: Negative for itching and rash.  Neurological: Negative for dizziness, tingling, tremors, focal weakness, seizures, weakness and headaches.  Psychiatric/Behavioral: Negative for depression. The patient is not nervous/anxious.     MEDICATIONS AT HOME:   Prior to Admission medications   Medication Sig Start Date End Date Taking? Authorizing Provider  apixaban (ELIQUIS) 5 MG TABS tablet Take 1 tablet (5 mg total) by mouth 2 (two) times daily. 05/07/15   Leonie Man, MD  finasteride (PROSCAR) 5 MG tablet Take 1 tablet (5 mg total) by mouth daily. 01/26/18   Stoioff, Ronda Fairly, MD  furosemide (LASIX) 40 MG tablet Take 1 tablet (40 mg total) by mouth daily. 09/01/17 05/18/18  Evans Lance, MD  gabapentin (NEURONTIN) 100 MG capsule Take 100 mg by mouth 3 (three) times daily.    [provider]  isosorbide mononitrate (IMDUR) 30 MG 24 hr tablet Take 0.5 tablets (15 mg total) by mouth daily. 05/24/18 08/22/18  End, Harrell Gave, MD  metoprolol succinate (TOPROL-XL) 25 MG 24 hr tablet Take 12.5 mg by mouth daily.    [provider]  nitroGLYCERIN (NITROSTAT) 0.4 MG SL tablet Place 1 tablet (0.4 mg total) under the tongue every 5 (five) minutes as needed for chest pain. Maximum of 3 doses. 04/13/18   End, Harrell Gave, MD      VITAL SIGNS:  Blood pressure 102/65, pulse 88, temperature 99 F (37.2 C), temperature source Oral, resp. rate (!) 23, height 5\' 11"  (1.803 m), weight 99.8 kg, SpO2 92 %. PHYSICAL EXAMINATION:  Physical Exam  GENERAL:  85 y.o.-year-old patient lying in the bed with no acute  distress.  EYES: Pupils equal, round, reactive to light and accommodation. No scleral icterus. Extraocular muscles intact.  HEENT: Head atraumatic, normocephalic. Oropharynx and nasopharynx clear.  NECK:  Supple, no jugular venous distention. No thyroid enlargement, no tenderness.  LUNGS: Normal breath sounds bilaterally, no wheezing, rales,rhonchi or crepitation. No use of accessory muscles of respiration.  CARDIOVASCULAR: S1, S2 normal. No murmurs, rubs, or gallops.  ABDOMEN: Soft, nontender, nondistended. Bowel sounds present. No organomegaly or mass.  EXTREMITIES: No pedal edema, cyanosis, or clubbing.  NEUROLOGIC: Cranial nerves II through XII are intact. Muscle strength 5/5 in all extremities. Sensation intact. Gait not checked.  PSYCHIATRIC: The patient is alert and oriented x 3.  SKIN: No obvious rash, lesion, or ulcer.   LABORATORY PANEL:  CBC Recent Labs  Lab 07/28/18 0828  WBC 8.6  HGB 15.4  HCT 45.5  PLT 194   ------------------------------------------------------------------------------------------------------------------  Chemistries  Recent Labs  Lab 07/28/18 0828  NA 136  K 3.9  CL 102  CO2 24  GLUCOSE 156*  BUN 16  CREATININE 1.12  CALCIUM 8.5*   ------------------------------------------------------------------------------------------------------------------  Cardiac Enzymes No results for input(s): TROPONINI in the last 168 hours. ------------------------------------------------------------------------------------------------------------------  RADIOLOGY:  Dg Chest Port 1 View  Result Date: 07/28/2018 CLINICAL DATA:  Substernal chest pain. EXAM: PORTABLE CHEST 1 VIEW COMPARISON:  December 04, 2015 FINDINGS: Cardiomediastinal silhouette is normal. Mediastinal contours appear intact. There is no evidence of focal airspace consolidation, pleural effusion or pneumothorax. Osseous structures are without acute abnormality. Soft tissues are grossly normal.  IMPRESSION: No active disease. Electronically Signed   By: Fidela Salisbury M.D.   On: 07/28/2018 09:12   IMPRESSION AND PLAN:  85 year old male being admitted for unstable angina  *Unstable angina -We will monitor on telemetry -Serial troponins -Consult cardiology for further evaluation -His chest pain is resolved with nitroglycerin  *Elevated troponin: We will trend further troponins  *History of PE: On Eliquis  *History of paroxysmal A. Fib/ PSVT: Followed by [facility] cardiology -His heart rate is under control -Consult cardiology    All the records are reviewed and case discussed with ED provider. Management plans discussed with the patient, Cardio and they are in agreement.  CODE STATUS: FULL CODE  TOTAL TIME TAKING CARE OF THIS PATIENT: 45 minutes.    Max Sane M.D on 07/28/2018 at 10:49 AM  Between 7am to 6pm - Pager - [phone]  After 6pm go to [email or web address] - [employer] [facility]  Office  [phone]  CC: Primary care physician; Kirk Ruths, MD   Note: This dictation was prepared with Dragon dictation along with smaller phrase technology. Any transcriptional errors that result from this process are unintentional.

## 2018-07-28 NOTE — Care Management Obs Status (Signed)
[facility] NOTIFICATION   Patient Details  Name: Blanche Gallien MRN: [id number] Date of Birth: 1927/04/26   Medicare Observation Status Notification Given:  Yes    Marshell Garfinkel, RN 07/28/2018, 10:50 AM

## 2018-07-28 NOTE — ED Notes (Signed)
Pt ambulatory to bathroom with a steady gait

## 2018-07-28 NOTE — Progress Notes (Signed)
*  PRELIMINARY RESULTS* Echocardiogram 2D Echocardiogram has been performed. Definity IV Contrast used on this study.  Jordan Figueroa 07/28/2018, 5:57 PM

## 2018-07-28 NOTE — TOC Initial Note (Addendum)
Transition of Care [facility]) - Initial/Assessment Note    Patient Details  Name: Jordan Figueroa MRN: [id number] Date of Birth: Jan 26, 1927  [facility] of [facility]) CM/SW Contact:    Marshell Garfinkel, RN Phone Number: 07/28/2018, 10:50 AM  Clinical Narrative:                  Met with patient to deliver [place] letter. He states he is independent at base line an able to drive. He lives with daughter Suanne Marker (work number [phone] cell [phone]) that works during the day. "He does 80-90% of the cooking". Wife died 36 years ago- anniversary tomorrow. PCP is Dr. Ouida Sills with [facility]. He denies problems with paying for medications.  He has follow up with PCP for blood lab and visit next week. He has gone to outpatient PT at [facility] last year due to back pain. No needs per patient. Patient called both Suanne Marker and Marlowe Kays (daughters) to let them know he was in the hospital during my visit. He does not have a cell phone. He used to volunteer here.   Expected Discharge Plan: Home/Self Care     Patient Goals and CMS Choice        Expected Discharge Plan and Services Expected Discharge Plan: Home/Self Care                                              Prior Living Arrangements/Services                       Activities of Daily Living      Permission Sought/Granted                  Emotional Assessment              Admission diagnosis:  chest pain Patient Active Problem List   Diagnosis Date Noted  . Unstable angina ([place]) 07/28/2018  . Stable angina ([place]) 05/18/2018  . Accelerating angina ([place]) 04/09/2018  . Chronic heart failure with preserved ejection fraction (HFpEF) ([place]) 04/09/2018  . PVC's (premature ventricular contractions) 04/08/2017  . CAD in native artery 04/08/2017  . Near syncope 04/08/2017  . Chronic anticoagulation 04/08/2017  . Essential hypertension 04/08/2017  . BPH with obstruction/lower urinary tract symptoms 12/22/2016  .  Nocturia 12/22/2016  . Primary osteoarthritis of right hip 07/30/2016  . Diastolic dysfunction with chronic heart failure ([place]) - Mild 07/03/2015  . Abnormal nuclear stress test 06/26/2015  . Atypical chest pain 06/06/2015  . History of pulmonary embolism 06/06/2015  . Dyspnea on exertion 06/06/2015  . PAF (paroxysmal atrial fibrillation) ([place]) 04/16/2015  . Chronic pulmonary embolism ([place]) 04/16/2015  . Dizziness   . Influenza   . PSVT (paroxysmal supraventricular tachycardia) ([place]) 04/10/2015   PCP:  Kirk Ruths, MD [facility]   [id number] [address] [id number]: [phone]: [phone]    Social Determinants of Health (SDOH) Interventions    Readmission Risk Interventions No flowsheet data found.

## 2018-07-28 NOTE — Plan of Care (Signed)
  Problem: Clinical Measurements: Goal: Ability to maintain clinical measurements within normal limits will improve Outcome: Progressing Goal: Cardiovascular complication will be avoided Outcome: Progressing   Problem: Activity: Goal: Risk for activity intolerance will decrease Outcome: Progressing   Problem: Pain Managment: Goal: General experience of comfort will improve Outcome: Progressing   Problem: Safety: Goal: Ability to remain free from injury will improve Outcome: Progressing

## 2018-07-28 NOTE — Consult Note (Signed)
Cardiology Consultation:   Patient ID: Jordan Figueroa; [id number]; [birth year]   Admit date: 07/28/2018 Date of Consult: 07/28/2018  Primary Care Provider: Kirk Ruths, MD Primary Cardiologist: End   Patient Profile:   Jordan Figueroa is a 85 y.o. male with a hx of nonobstructive CAD by LHC in 2017, PE on Eliquis, chronic SOB, reported PAF with no documentation existing in our records, paroxysmal SVT, presyncope/dizziness, hiatal hernia, INS, and GERD who is being seen today for the evaluation of chest pain at the request of Dr. Manuella Ghazi.  History of Present Illness:   Mr. [name] underwent R/LHC in 06/2015 that showed mild diffuse disease involving the left main,20 to 25% proximal and mid/distal RCA stenosis, mildly elevated left heart filling pressure, normal right heart filling and pulmonary arterial pressure, normal LVEF, with mildly reduced cardiac output/index. Very little data supported cardiac etiology of his symptoms. Echo just preceding his cath showed an EF of 60 to 40%, grade 1 diastolic dysfunction, mild mitral regurgitation, normal RV size and systolic function, mild pulmonary hypertension. He was seen in early to mid 2019 with dizziness/presyncope with noted PVCs on EKG repeat echo showing EF of 60 to 65%, no regional wall motion abnormalities, grade 1 diastolic dysfunction, trivial aortic regurgitation, mild mitral regurgitation, mildly dilated left atrium, RV systolic function was normal, PASP normal, rare PVCs were noted. Later in 2019 he noted worsening SOB and underwent Lexiscan Myoview in 06/2017 that showed no significant ischemia, EF 55-65%, and was overall a low risk study. 24-hour Holter monitor showed an average heart rate of 67 bpm (range 49-110 bpm), longest R-R interval of 1.7 seconds, rare PACs including a 5 beat atrial run, frequent PVCs including couplets, triplets, as well as bigeminy were noted with an overall PVC burden of 8%. No sustained arrhythmia or prolonged  pause was noted. Patient's reported SOB corresponded to sinus rhythm with PVCs. Due to continued symptoms, he underwent 14-day Zio monitoring in 08/2017 that showed a predominant rhythm of sinus with an average heart rate of 64 bpm with a range of 48-171 bpm, occasional PACs/PVCs including couplets and triplets, 4 episodes of NSVT lasting up to 4 beats, 48 episodes of SVT lasting up to 12.4 seconds with a maximal rate of 171 bpm, no sustained arrhythmias or prolonged pauses were noted. Patient triggered events corresponded to sinus rhythm with PVCs. More recently, he was seen in 03/2018 with accelerating angina with continued exertional dyspnea dating back to his prior PE. In this setting, he underwent Lexiscan Myoview on 04/12/2018 that showed no significant ischemia, EF 40% with GI uptake artifact possibly contributing, no EKG changes concerning for ischemia, rare PVCs. Overall, this was a low risk study. With this, he was started on Imdur. He was seen virtually in 05/2018 and noted resolution of chest discomfort following addition of Imdur. Hihs exertional dyspnea was unchanged. He did note a headache with Imdur (was taking 30 mg rather than 15 mg). Given headache, he was directed to decrease Imdur to 15 mg daily.   Over the past couple of months, the patient has noted worsening dysphagia with increased issues with getting foods stuck in his esophagus, which has been an issue for a year or more. No prior EGD. He walks 0.75 miles 4 days per week without any angina. His chronic dyspnea with this is unchanged. Patient was preparing to go to the ophthalmologist today for an eye issue and while drinking some coffee he developed chest pain, SOB, diaphoresis, and nausea. He  took 1 SL NTG with resolution in symptoms and has felt at his baseline since. However, given this was the first time he had taken SL NTG, he wanted to come to the ED for evaluation.   Upon the patient's arrival to [facility] they were found to have stable  vitals. His symptoms had fully resolved. EKG showed NSR, 98 bpm, 1st degree AV block,bifascicular block with LAFB and incomplete RBBB, rare PVC, baseline artifact, nonspecific st/t changes (grossly unchanged from prior), CXR showed no active disease. Labs showed hs-Tn 23 with delta of 18, unremarkable CBC, potassium 3.9, SCr 1.12. Given symptoms, he was admitted and cardiology was consulted. Patient currently states he "feels great."  Past Medical History:  Diagnosis Date   Cancer ([place])    skin   Colon polyps    Dyspnea on exertion    a. 03/2015 Echo: Ef 60-65%, Gr1 DD, mild MR, nl RV fxn, mild PAH; b. 06/2015 RHC: minimal elevated PCWP; c.  04/2017 Echo: EF 60-65%, no rwma, GR1 DD, triv AI, mild MR, mildly dil LA, nl RV fxn, nl PASP.   GERD (gastroesophageal reflux disease)    Hemorrhoids    Hiatal hernia    IBS (irritable bowel syndrome)    Non-obstructive CAD (coronary artery disease)    a. 06/2015 Cath: LM 5, LAD 5ost, 30p, 48m, LCX nl, OM1 min irregs, OM2 nl, RCA 20p, 61m, RPDA/RPL1 min irregs, RPAV nl; b. 06/2017 Lexiscan MV: EF 55-65%, low risk.   PAF (paroxysmal atrial fibrillation) ([place])    a. Reported - no documentation in our records. Never seen on monitoring.   Paroxysmal supraventricular tachycardia [facility]) March 2017   First documented during episode of influenza   Pulmonary embolism ([place])    a. Chronic eliquis.   PVC's (premature ventricular contractions)    a. 06/2017 24h Holter: Avg HR 67 (49-110), rare PACs up to 5 beat run, freq PVC's - 8% burden; b. 08/2017 Zio: Avg HR 64 (48-171), occas PACs & PVCs. 4 episodes of NSVT (4 beats longest). 48 episodes of SVT up to 12.4 secs, max rate 171.    Past Surgical History:  Procedure Laterality Date   CARDIAC CATHETERIZATION N/A 06/28/2015   Procedure: Left Heart Cath and Coronary Angiography;  Surgeon: Leonie Man, MD;  Location: [place] [facility];  Service: Cardiovascular;  Laterality: N/A;; proximal and mid RCA focal  20-25% lesions. Mild diffuse calcifications in the left main and proximal LAD ~30%, LVEDP 18 mmHg   CARDIAC CATHETERIZATION  06/28/2015   Procedure: Right Heart Cath;  Surgeon: Leonie Man, MD;  Location: [place] [facility];  Service: Cardiovascular;; essentially normal right heart cath pressures. Cardiac Output 4.74.Normal wedge pressure roughly 12 mmHg.    COLON SURGERY  07/14/2013   small bowel obstruction   SKIN SURGERY     TRANSTHORACIC ECHOCARDIOGRAM  04/10/2015   EF 60-65%. No RWMA, GR 1 DD. Mild PA pressure elevation of 38 mmHg.     Home Meds: Prior to Admission medications   Medication Sig Start Date End Date Taking? Authorizing Provider  apixaban (ELIQUIS) 5 MG TABS tablet Take 1 tablet (5 mg total) by mouth 2 (two) times daily. 05/07/15   Leonie Man, MD  finasteride (PROSCAR) 5 MG tablet Take 1 tablet (5 mg total) by mouth daily. 01/26/18   Stoioff, Ronda Fairly, MD  furosemide (LASIX) 40 MG tablet Take 1 tablet (40 mg total) by mouth daily. 09/01/17 05/18/18  Evans Lance, MD  gabapentin (NEURONTIN) 100  MG capsule Take 100 mg by mouth 3 (three) times daily.    [provider]  isosorbide mononitrate (IMDUR) 30 MG 24 hr tablet Take 0.5 tablets (15 mg total) by mouth daily. 05/24/18 08/22/18  End, Harrell Gave, MD  metoprolol succinate (TOPROL-XL) 25 MG 24 hr tablet Take 12.5 mg by mouth daily.    [provider]  nitroGLYCERIN (NITROSTAT) 0.4 MG SL tablet Place 1 tablet (0.4 mg total) under the tongue every 5 (five) minutes as needed for chest pain. Maximum of 3 doses. 04/13/18   End, Harrell Gave, MD    Inpatient Medications: Scheduled Meds:  nitroGLYCERIN  1 inch Topical Once   sodium chloride flush  3 mL Intravenous Once   Continuous Infusions:  PRN Meds:   Allergies:  No Known Allergies  Social History:   Social History   Socioeconomic History   Marital status: Widowed    Spouse name: Not on file   Number of children: Not on file   Years of  education: Not on file   Highest education level: Not on file  Occupational History   Not on file  Social Needs   Financial resource strain: Not on file   Food insecurity    Worry: Not on file    Inability: Not on file   Transportation needs    Medical: Not on file    Non-medical: Not on file  Tobacco Use   Smoking status: Former Smoker    Packs/day: 2.00    Years: 25.00    Pack years: 50.00    Types: Cigarettes    Quit date: 02/25/1973    Years since quitting: 45.4   Smokeless tobacco: Never Used  Substance and Sexual Activity   Alcohol use: No    Alcohol/week: 0.0 standard drinks   Drug use: No   Sexual activity: Not on file  Lifestyle   Physical activity    Days per week: Not on file    Minutes per session: Not on file   Stress: Not on file  Relationships   Social connections    Talks on phone: Not on file    Gets together: Not on file    Attends religious service: Not on file    Active member of club or organization: Not on file    Attends meetings of clubs or organizations: Not on file    Relationship status: Not on file   Intimate partner violence    Fear of current or ex partner: Not on file    Emotionally abused: Not on file    Physically abused: Not on file    Forced sexual activity: Not on file  Other Topics Concern   Not on file  Social History Narrative   Not on file     Family History:   Family History  Problem Relation Age of Onset   CVA Father    Alzheimer's disease Mother    Heart Problems Sister    Heart attack Brother    Prostate cancer Neg Hx    Bladder Cancer Neg Hx    Kidney cancer Neg Hx     ROS:  Review of Systems  Constitutional: Positive for malaise/fatigue. Negative for chills, diaphoresis, fever and weight loss.  HENT: Negative for congestion.   Eyes: Negative for discharge and redness.  Respiratory: Positive for shortness of breath. Negative for cough, hemoptysis, sputum production and wheezing.     Cardiovascular: Negative for chest pain, palpitations, orthopnea, claudication, leg swelling and PND.  Gastrointestinal: Negative for  abdominal pain, blood in stool, heartburn, melena, nausea and vomiting.       Positive for dysphagia   Genitourinary: Negative for hematuria.  Musculoskeletal: Negative for falls and myalgias.  Skin: Negative for rash.  Neurological: Positive for weakness. Negative for dizziness, tingling, tremors, sensory change, speech change, focal weakness and loss of consciousness.  Endo/Heme/Allergies: Does not bruise/bleed easily.  Psychiatric/Behavioral: Negative for substance abuse. The patient is not nervous/anxious.   All other systems reviewed and are negative.     Physical Exam/Data:   Vitals:   07/28/18 0822 07/28/18 0824 07/28/18 0937  BP:  116/69 102/65  Pulse:  96 88  Resp:  18 (!) 23  Temp:  99 F (37.2 C)   TempSrc:  Oral   SpO2:  93% 92%  Weight: 99.8 kg    Height: 5\' 11"  (1.803 m)     No intake or output data in the 24 hours ending 07/28/18 0954 Filed Weights   07/28/18 0822  Weight: 99.8 kg   Body mass index is 30.68 kg/m.   Physical Exam: General: Well developed, well nourished, in no acute distress. Head: Normocephalic, atraumatic, sclera non-icteric, no xanthomas, nares without discharge.  Neck: Negative for carotid bruits. JVD not elevated. Lungs: Clear bilaterally to auscultation without wheezes, rales, or rhonchi. Breathing is unlabored. Heart: RRR with S1 S2. No murmurs, rubs, or gallops appreciated. Abdomen: Soft, non-tender, non-distended with normoactive bowel sounds. No hepatomegaly. No rebound/guarding. No obvious abdominal masses. Msk:  Strength and tone appear normal for age. Extremities: No clubbing or cyanosis. No edema. Distal pedal pulses are 2+ and equal bilaterally. Neuro: Alert and oriented X 3. No facial asymmetry. No focal deficit. Moves all extremities spontaneously. Psych:  Responds to questions appropriately  with a normal affect.   EKG:  The EKG was personally reviewed and demonstrates: NSR, 98 bpm, 1st degree AV block,bifascicular block with LAFB and incomplete RBBB, rare PVC, baseline artifact, nonspecific st/t changes Telemetry:  Telemetry was personally reviewed and demonstrates: SR with PVCs  Weights: Filed Weights   07/28/18 0822  Weight: 99.8 kg    Relevant CV Studies: Myoview 03/2018: Pharmacological myocardial perfusion imaging study with no significant ischemia Normal wall motion, EF estimated at 40% (GI uptake artifact noted, possibly contributing to depressed EF) No EKG changes concerning for ischemia at peak stress or in recovery. Rare PVC Low risk scan __________  Elwyn Reach 08/2017:  The patient was monitored for 13 days, 19 hours.  The predominant rhythm was sinus with an average rate of 64 bpm (range 48-171 bpm).  There were occasional PAC's and PVC's, including couplets and triplets.  Four episodes of non-sustained ventricular tachycardia lasting up to 4 beats were observed.  There were 48 episodes of supraventricular tachycardia lasting up to 12.4 seconds with a maximal rate of 171 bpm.  No sustained arrhythmia or prolonged pause was identified.  Patient triggered events correspond to sinus rhythm with PVC's.   Predominantly sinus rhythm with occasional PAC's and PVC's.  Multiple episodes of PSVT noted, as well as brief runs of NSVT.  No sustained arrhythmia. __________  2D Echo 04/2017: - Left ventricle: The cavity size was normal. Systolic function was   normal. The estimated ejection fraction was in the range of 60%   to 65%. Wall motion was normal; there were no regional wall   motion abnormalities. Doppler parameters are consistent with   abnormal left ventricular relaxation (grade 1 diastolic   dysfunction). - Aortic valve: There was trivial regurgitation. - Mitral  valve: There was mild regurgitation. - Left atrium: The atrium was mildly dilated. - Right  ventricle: Systolic function was normal. - Pulmonary arteries: Systolic pressure was within the normal   range.  Impressions: - Rare PVCs noted. __________  LHC 06/2015:  RCA - prox & mid-distal focal 20-25% lesions  Mild diffuse calcification in the left main and proximal LAD system.  Prox LAD lesion, 30% stenosed.  Normal EF with mildly reduced cardiac output of 4.74  Mildly elevated LVEDP, with high normal wedge pressure.   Angiographically minimal coronary disease with preserved ejection fraction, mildly reduced cardiac output/index. Mostly normal right heart Pressures with the exception of mildly elevated PCWP and LVEDP  Very little data from this evaluation that could explain the patient's exertional dyspnea. Perhaps maybe mild diastolic dysfunction, but otherwise normal cardiac function and normal coronaries. Essentially right heart pressures with no evidence pulmonary hypertension.  Plan: Consider other noncardiac etiology Consider low-dose diuretic  Laboratory Data:  Chemistry Recent Labs  Lab 07/28/18 0828  NA 136  K 3.9  CL 102  CO2 24  GLUCOSE 156*  BUN 16  CREATININE 1.12  CALCIUM 8.5*  GFRNONAA 58*  GFRAA >60  ANIONGAP 10    No results for input(s): PROT, ALBUMIN, AST, ALT, ALKPHOS, BILITOT in the last 168 hours. Hematology Recent Labs  Lab 07/28/18 0828  WBC 8.6  RBC 5.27  HGB 15.4  HCT 45.5  MCV 86.3  MCH 29.2  MCHC 33.8  RDW 13.4  PLT 194   Cardiac EnzymesNo results for input(s): TROPONINI in the last 168 hours. No results for input(s): TROPIPOC in the last 168 hours.  BNPNo results for input(s): BNP, PROBNP in the last 168 hours.  DDimer No results for input(s): DDIMER in the last 168 hours.  Radiology/Studies:  Dg Chest Port 1 View  Result Date: 07/28/2018 IMPRESSION: No active disease. Electronically Signed   By: Fidela Salisbury M.D.   On: 07/28/2018 09:12    Assessment and Plan:   1. Nonobstructive CAD with atypical  chest pain: -Patient has undergone LHC in 2017 that showed nonobstructive disease with symptoms felt to be noncardiac. Since then he has undergone noninvasive testing x 2 since, most recently in 03/2018 showing no evidence of ischemia. There has been felt to be a possible component of microvascular disease with improvement in symptoms from Imdur -Hs-Tn 23 with a delta of 18, not consistent with ACS (old troponin test < 0.03 x 2) -Cannot exclude esophageal spasm given worsening dysphagia (SL NTG would also treat this) -Patient currently states he "feels great" -Low suspicion for cardiac etiology given prior nonischemic workups, nonexertional component, and association with food consumption  -He is on Eliquis for PE as below, which would mean at the earliest, we could potentially cath him on the afternoon of 7/17, though this is not necessary at this time -He prefers to avoid further cardiac testing at this time given prior nonischemic workups and resolution of symptoms -Continue Imdur -No indication for heparin gtt  2. Paroxysmal SVT: -Maintaining sinus rhythm  -Continue PTA Toprol XL  3. PE: -He has chronic dyspnea dating back to his PE several years ago -He has been compliant with Eliquis, making recurrent PE less likely -Eliquis can be held for today just in case a procedure is needed prior to discharge with resumption of Eliquis on 7/17, no need for heparin gtt at this time (discussed with MD)  4. Reported Afib: -No evidence of this in Epic  5. Dysphagia: -Worsening  over the past several months -Cannot exclude this as etiology of symptoms -Recommend he eat today and assess his symptoms -GI follow up   For questions or updates, please contact [place] Please consult [email or web address] for contact info under Cardiology/STEMI.   Signed, [name], PA-C [place] Pager: [phone] 07/28/2018, 9:54 AM

## 2018-07-28 NOTE — ED Provider Notes (Signed)
[facility] Emergency Department Provider Note       Time seen: ----------------------------------------- 8:47 AM on 07/28/2018 -----------------------------------------   I have reviewed the triage vital signs and the nursing notes.  HISTORY   Chief Complaint Chest Pain    HPI Jordan Figueroa is a 85 y.o. male with a history of GERD, IBS, atrial fibrillation, SVT, pulmonary embolism, PVCs who presents to the ED for substernal chest pain with shortness of breath.  Pain was sudden onset this morning while sitting drinking coffee.  He did take 1 sublingual nitroglycerin with some improvement in his pain.  Pain is 4 out of 10.  Patient had some sweats and nausea with the onset of pain.  This is the first time he is ever had to take nitroglycerin.  Past Medical History:  Diagnosis Date  . Cancer ([place])    skin  . Colon polyps   . Dyspnea on exertion    a. 03/2015 Echo: Ef 60-65%, Gr1 DD, mild MR, nl RV fxn, mild PAH; b. 06/2015 RHC: minimal elevated PCWP; c.  04/2017 Echo: EF 60-65%, no rwma, GR1 DD, triv AI, mild MR, mildly dil LA, nl RV fxn, nl PASP.  Marland Kitchen GERD (gastroesophageal reflux disease)   . Hemorrhoids   . Hiatal hernia   . IBS (irritable bowel syndrome)   . Non-obstructive CAD (coronary artery disease)    a. 06/2015 Cath: LM 5, LAD 5ost, 30p, 54m, LCX nl, OM1 min irregs, OM2 nl, RCA 20p, 44m, RPDA/RPL1 min irregs, RPAV nl; b. 06/2017 Lexiscan MV: EF 55-65%, low risk.  Marland Kitchen PAF (paroxysmal atrial fibrillation) ([place])    a. Reported - no documentation in our records. Never seen on monitoring.  . Paroxysmal supraventricular tachycardia [facility]) March 2017   First documented during episode of influenza  . Pulmonary embolism ([place])    a. Chronic eliquis.  Marland Kitchen PVC's (premature ventricular contractions)    a. 06/2017 24h Holter: Avg HR 67 (49-110), rare PACs up to 5 beat run, freq PVC's - 8% burden; b. 08/2017 Zio: Avg HR 64 (48-171), occas PACs & PVCs. 4 episodes of NSVT (4  beats longest). 48 episodes of SVT up to 12.4 secs, max rate 171.    Patient Active Problem List   Diagnosis Date Noted  . Stable angina ([place]) 05/18/2018  . Accelerating angina ([place]) 04/09/2018  . Chronic heart failure with preserved ejection fraction (HFpEF) ([place]) 04/09/2018  . PVC's (premature ventricular contractions) 04/08/2017  . CAD in native artery 04/08/2017  . Near syncope 04/08/2017  . Chronic anticoagulation 04/08/2017  . Essential hypertension 04/08/2017  . BPH with obstruction/lower urinary tract symptoms 12/22/2016  . Nocturia 12/22/2016  . Primary osteoarthritis of right hip 07/30/2016  . Diastolic dysfunction with chronic heart failure ([place]) - Mild 07/03/2015  . Abnormal nuclear stress test 06/26/2015  . Atypical chest pain 06/06/2015  . History of pulmonary embolism 06/06/2015  . Dyspnea on exertion 06/06/2015  . PAF (paroxysmal atrial fibrillation) ([place]) 04/16/2015  . Chronic pulmonary embolism ([place]) 04/16/2015  . Dizziness   . Influenza   . PSVT (paroxysmal supraventricular tachycardia) ([place]) 04/10/2015    Past Surgical History:  Procedure Laterality Date  . CARDIAC CATHETERIZATION N/A 06/28/2015   Procedure: Left Heart Cath and Coronary Angiography;  Surgeon: Leonie Man, MD;  Location: [place] [facility];  Service: Cardiovascular;  Laterality: N/A;; proximal and mid RCA focal 20-25% lesions. Mild diffuse calcifications in the left main and proximal LAD ~30%, LVEDP 18 mmHg  . CARDIAC  CATHETERIZATION  06/28/2015   Procedure: Right Heart Cath;  Surgeon: Leonie Man, MD;  Location: [place] [facility];  Service: Cardiovascular;; essentially normal right heart cath pressures. Cardiac Output 4.74.Normal wedge pressure roughly 12 mmHg.   Marland Kitchen COLON SURGERY  07/14/2013   small bowel obstruction  . SKIN SURGERY    . TRANSTHORACIC ECHOCARDIOGRAM  04/10/2015   EF 60-65%. No RWMA, GR 1 DD. Mild PA pressure elevation of 38 mmHg.    Allergies Patient has no known  allergies.  Social History Social History   Tobacco Use  . Smoking status: Former Smoker    Packs/day: 2.00    Years: 25.00    Pack years: 50.00    Types: Cigarettes    Quit date: 02/25/1973    Years since quitting: 45.4  . Smokeless tobacco: Never Used  Substance Use Topics  . Alcohol use: No    Alcohol/week: 0.0 standard drinks  . Drug use: No   Review of Systems Constitutional: Negative for fever. Cardiovascular: Positive for chest pain Respiratory: Negative for shortness of breath. Gastrointestinal: Negative for abdominal pain, positive for nausea Musculoskeletal: Negative for back pain. Skin: Positive for sweats Neurological: Negative for headaches, focal weakness or numbness.  All systems negative/normal/unremarkable except as stated in the HPI  ____________________________________________   PHYSICAL EXAM:  VITAL SIGNS: ED Triage Vitals  Enc Vitals Group     BP 07/28/18 0824 116/69     Pulse Rate 07/28/18 0824 96     Resp 07/28/18 0824 18     Temp 07/28/18 0824 99 F (37.2 C)     Temp Source 07/28/18 0824 Oral     SpO2 07/28/18 0824 93 %     Weight 07/28/18 0822 220 lb (99.8 kg)     Height 07/28/18 0822 5\' 11"  (1.803 m)     Head Circumference --      Peak Flow --      Pain Score 07/28/18 0822 4     Pain Loc --      Pain Edu? --      Excl. in [place]? --     Constitutional: Alert and oriented. Well appearing and in no distress. Eyes: Conjunctivae are normal. Normal extraocular movements. ENT      Head: Normocephalic and atraumatic.      Nose: No congestion/rhinnorhea.      Mouth/Throat: Mucous membranes are moist.      Neck: No stridor. Cardiovascular: Normal rate, regular rhythm. No murmurs, rubs, or gallops. Respiratory: Normal respiratory effort without tachypnea nor retractions. Breath sounds are clear and equal bilaterally. No wheezes/rales/rhonchi. Gastrointestinal: Soft and nontender. Normal bowel sounds Musculoskeletal: Nontender with normal  range of motion in extremities. No lower extremity tenderness nor edema. Neurologic:  Normal speech and language. No gross focal neurologic deficits are appreciated.  Skin:  Skin is warm, dry and intact. No rash noted. Psychiatric: Mood and affect are normal. Speech and behavior are normal.  ____________________________________________  EKG: Interpreted by me.  Sinus rhythm with first-degree AV block, rate is 98 bpm, pulmonary disease pattern, incomplete right bundle branch block, left anterior fascicular block  ____________________________________________  ED COURSE:  As part of my medical decision making, I reviewed the following data within the [place] History obtained from family if available, nursing notes, old chart and ekg, as well as notes from prior ED visits. Patient presented for chest pain, we will assess with labs and imaging as indicated at this time.   Procedures  [name]  was evaluated in Emergency Department on 07/28/2018 for the symptoms described in the history of present illness. He was evaluated in the context of the global COVID-19 pandemic, which necessitated consideration that the patient might be at risk for infection with the SARS-CoV-2 virus that causes COVID-19. Institutional protocols and algorithms that pertain to the evaluation of patients at risk for COVID-19 are in a state of rapid change based on information released by regulatory bodies including the CDC and federal and state organizations. These policies and algorithms were followed during the patient's care in the ED.  ____________________________________________   LABS (pertinent positives/negatives)  Labs Reviewed  BASIC METABOLIC PANEL - Abnormal; Notable for the following components:      Result Value   Glucose, Bld 156 (*)    Calcium 8.5 (*)    GFR calc non Af Amer 58 (*)    All other components within normal limits  TROPONIN I (HIGH SENSITIVITY) - Abnormal; Notable for the  following components:   Troponin I (High Sensitivity) 23 (*)    All other components within normal limits  CBC    RADIOLOGY Images were viewed by me  Chest x-ray IMPRESSION: No active disease. ____________________________________________   DIFFERENTIAL DIAGNOSIS   Unstable angina, MI, PE, pneumothorax, arrhythmia  FINAL ASSESSMENT AND PLAN  Chest pain   Plan: The patient had presented for sudden onset of chest pain. Patient's labs did reveal a slightly elevated troponin. Patient's imaging was negative for any acute process.  He is currently on Eliquis, I will discuss with cardiology and the hospitalist for admission and full cardiac work-up.   Laurence Aly, MD    Note: This note was generated in part or whole with voice recognition software. Voice recognition is usually quite accurate but there are transcription errors that can and very often do occur. I apologize for any typographical errors that were not detected and corrected.     Earleen Newport, MD 07/28/18 [phone]

## 2018-07-28 NOTE — ED Triage Notes (Signed)
Pt c/o substernal chest pain with SOb, sudden onset this morning while sitting drinking coffee, states he did take 1 nitro SL with mild ease of sx.

## 2018-07-28 NOTE — ED Notes (Signed)
Patient denies pain and is resting comfortably.  

## 2018-07-28 NOTE — ED Notes (Signed)
ED TO INPATIENT HANDOFF REPORT  ED Nurse Name and Phone #: [place] [phone]  S Name/Age/Gender Jordan Figueroa 85 y.o. male Room/Bed: ED10A/ED10A  Code Status   Code Status: Prior  Home/SNF/Other Home Patient oriented to: self, place, time and situation Is this baseline? Yes   Triage Complete: Triage complete  Chief Complaint chest pain  Triage Note Pt c/o substernal chest pain with SOb, sudden onset this morning while sitting drinking coffee, states he did take 1 nitro SL with mild ease of sx.    Allergies No Known Allergies  Level of Care/Admitting Diagnosis ED Disposition    ED Disposition Condition [facility] Area: [place] [[id number]]  Level of Care: Telemetry [5]  Covid Evaluation: Confirmed COVID Negative  Diagnosis: Unstable angina [facility]) [[id number]]  Admitting Physician: Max Sane [[id number]]  Attending Physician: Max Sane [[id number]]  PT Class (Do Not Modify): Observation [104]  PT Acc Code (Do Not Modify): Observation [10022]       B Medical/Surgery History Past Medical History:  Diagnosis Date  . Cancer ([place])    skin  . Colon polyps   . Dyspnea on exertion    a. 03/2015 Echo: Ef 60-65%, Gr1 DD, mild MR, nl RV fxn, mild PAH; b. 06/2015 RHC: minimal elevated PCWP; c.  04/2017 Echo: EF 60-65%, no rwma, GR1 DD, triv AI, mild MR, mildly dil LA, nl RV fxn, nl PASP.  Marland Kitchen GERD (gastroesophageal reflux disease)   . Hemorrhoids   . Hiatal hernia   . IBS (irritable bowel syndrome)   . Non-obstructive CAD (coronary artery disease)    a. 06/2015 Cath: LM 5, LAD 5ost, 30p, 53m, LCX nl, OM1 min irregs, OM2 nl, RCA 20p, 61m, RPDA/RPL1 min irregs, RPAV nl; b. 06/2017 Lexiscan MV: EF 55-65%, low risk.  Marland Kitchen PAF (paroxysmal atrial fibrillation) ([place])    a. Reported - no documentation in our records. Never seen on monitoring.  . Paroxysmal supraventricular tachycardia [facility]) March 2017   First documented during episode of influenza  . Pulmonary  embolism ([place])    a. Chronic eliquis.  Marland Kitchen PVC's (premature ventricular contractions)    a. 06/2017 24h Holter: Avg HR 67 (49-110), rare PACs up to 5 beat run, freq PVC's - 8% burden; b. 08/2017 Zio: Avg HR 64 (48-171), occas PACs & PVCs. 4 episodes of NSVT (4 beats longest). 48 episodes of SVT up to 12.4 secs, max rate 171.   Past Surgical History:  Procedure Laterality Date  . CARDIAC CATHETERIZATION N/A 06/28/2015   Procedure: Left Heart Cath and Coronary Angiography;  Surgeon: Leonie Man, MD;  Location: [place] [facility];  Service: Cardiovascular;  Laterality: N/A;; proximal and mid RCA focal 20-25% lesions. Mild diffuse calcifications in the left main and proximal LAD ~30%, LVEDP 18 mmHg  . CARDIAC CATHETERIZATION  06/28/2015   Procedure: Right Heart Cath;  Surgeon: Leonie Man, MD;  Location: [place] [facility];  Service: Cardiovascular;; essentially normal right heart cath pressures. Cardiac Output 4.74.Normal wedge pressure roughly 12 mmHg.   Marland Kitchen COLON SURGERY  07/14/2013   small bowel obstruction  . SKIN SURGERY    . TRANSTHORACIC ECHOCARDIOGRAM  04/10/2015   EF 60-65%. No RWMA, GR 1 DD. Mild PA pressure elevation of 38 mmHg.     A IV Location/Drains/Wounds Patient Lines/Drains/Airways Status   Active Line/Drains/Airways    Name:   Placement date:   Placement time:   Site:   Days:   Peripheral IV 07/28/18 Right  Forearm   07/28/18    0938    Forearm   less than 1          Intake/Output Last 24 hours No intake or output data in the 24 hours ending 07/28/18 1112  Labs/Imaging Results for orders placed or performed during the hospital encounter of 07/28/18 (from the past 48 hour(s))  Basic metabolic panel     Status: Abnormal   Collection Time: 07/28/18  8:28 AM  Result Value Ref Range   Sodium 136 135 - 145 mmol/L   Potassium 3.9 3.5 - 5.1 mmol/L   Chloride 102 98 - 111 mmol/L   CO2 24 22 - 32 mmol/L   Glucose, Bld 156 (H) 70 - 99 mg/dL   BUN 16 8 - 23 mg/dL    Creatinine, Ser 1.12 0.61 - 1.24 mg/dL   Calcium 8.5 (L) 8.9 - 10.3 mg/dL   GFR calc non Af Amer 58 (L) >60 mL/min   GFR calc Af Amer >60 >60 mL/min   Anion gap 10 5 - 15    Comment: Performed at [facility], [address] [place], [place] [address]  CBC     Status: None   Collection Time: 07/28/18  8:28 AM  Result Value Ref Range   WBC 8.6 4.0 - 10.5 K/uL   RBC 5.27 4.22 - 5.81 MIL/uL   Hemoglobin 15.4 13.0 - 17.0 g/dL   HCT 45.5 39.0 - 52.0 %   MCV 86.3 80.0 - 100.0 fL   MCH 29.2 26.0 - 34.0 pg   MCHC 33.8 30.0 - 36.0 g/dL   RDW 13.4 11.5 - 15.5 %   Platelets 194 150 - 400 K/uL   nRBC 0.0 0.0 - 0.2 %    Comment: Performed at [facility], [place], [place] [address]  Troponin I (High Sensitivity)     Status: Abnormal   Collection Time: 07/28/18  8:28 AM  Result Value Ref Range   Troponin I (High Sensitivity) 23 (H) <18 ng/L    Comment: (NOTE) Elevated high sensitivity troponin I (hsTnI) values and significant  changes across serial measurements may suggest ACS but many other  chronic and acute conditions are known to elevate hsTnI results.  Refer to the "Links" section for chest pain algorithms and additional  guidance. Performed at [facility], [address] [place], [place] [address]   SARS Coronavirus 2 (CEPHEID- Performed in [facility] hospital lab), Hosp Order     Status: None   Collection Time: 07/28/18 10:07 AM   Specimen: Nasopharyngeal Swab  Result Value Ref Range   SARS Coronavirus 2 NEGATIVE NEGATIVE    Comment: (NOTE) If result is NEGATIVE SARS-CoV-2 target nucleic acids are NOT DETECTED. The SARS-CoV-2 RNA is generally detectable in upper and lower  respiratory specimens during the acute phase of infection. The lowest  concentration of SARS-CoV-2 viral copies this assay can detect is 250  copies / mL. A negative result does not preclude SARS-CoV-2 infection  and should not be used as the sole basis for  treatment or other  patient management decisions.  A negative result may occur with  improper specimen collection / handling, submission of specimen other  than nasopharyngeal swab, presence of viral mutation(s) within the  areas targeted by this assay, and inadequate number of viral copies  (<250 copies / mL). A negative result must be combined with clinical  observations, patient history, and epidemiological information. If result is POSITIVE SARS-CoV-2 target nucleic  acids are DETECTED. The SARS-CoV-2 RNA is generally detectable in upper and lower  respiratory specimens dur ing the acute phase of infection.  Positive  results are indicative of active infection with SARS-CoV-2.  Clinical  correlation with patient history and other diagnostic information is  necessary to determine patient infection status.  Positive results do  not rule out bacterial infection or co-infection with other viruses. If result is PRESUMPTIVE POSTIVE SARS-CoV-2 nucleic acids MAY BE PRESENT.   A presumptive positive result was obtained on the submitted specimen  and confirmed on repeat testing.  While 2019 novel coronavirus  (SARS-CoV-2) nucleic acids may be present in the submitted sample  additional confirmatory testing may be necessary for epidemiological  and / or clinical management purposes  to differentiate between  SARS-CoV-2 and other Sarbecovirus currently known to infect humans.  If clinically indicated additional testing with an alternate test  methodology [phone]) is advised. The SARS-CoV-2 RNA is generally  detectable in upper and lower respiratory sp ecimens during the acute  phase of infection. The expected result is Negative. Fact Sheet for Patients:  StrictlyIdeas.no Fact Sheet for Healthcare Providers: BankingDealers.co.za This test is not yet approved or cleared by the [place] FDA and has been authorized for detection and/or  diagnosis of SARS-CoV-2 by FDA under an Emergency Use Authorization (EUA).  This EUA will remain in effect (meaning this test can be used) for the duration of the COVID-19 declaration under Section 564(b)(1) of the Act, 21 U.S.C. section 360bbb-3(b)(1), unless the authorization is terminated or revoked sooner. Performed at [facility], [place], [place] [address]   Troponin I (High Sensitivity)     Status: Abnormal   Collection Time: 07/28/18 10:08 AM  Result Value Ref Range   Troponin I (High Sensitivity) 18 (H) <18 ng/L    Comment: (NOTE) Elevated high sensitivity troponin I (hsTnI) values and significant  changes across serial measurements may suggest ACS but many other  chronic and acute conditions are known to elevate hsTnI results.  Refer to the "Links" section for chest pain algorithms and additional  guidance. Performed at [facility], [address], [place], [place] [address]    Dg Chest Port 1 View  Result Date: 07/28/2018 CLINICAL DATA:  Substernal chest pain. EXAM: PORTABLE CHEST 1 VIEW COMPARISON:  December 04, 2015 FINDINGS: Cardiomediastinal silhouette is normal. Mediastinal contours appear intact. There is no evidence of focal airspace consolidation, pleural effusion or pneumothorax. Osseous structures are without acute abnormality. Soft tissues are grossly normal. IMPRESSION: No active disease. Electronically Signed   By: Fidela Salisbury M.D.   On: 07/28/2018 09:12    Pending Labs Unresulted Labs (From admission, onward)    Start     Ordered   Signed and Held  Lipid panel  Once,   R     Signed and Held          Vitals/Pain Today's Vitals   07/28/18 0822 07/28/18 0824 07/28/18 0900 07/28/18 0937  BP:  116/69  102/65  Pulse:  96  88  Resp:  18  (!) 23  Temp:  99 F (37.2 C)    TempSrc:  Oral    SpO2:  93%  92%  Weight: 99.8 kg     Height: 5\' 11"  (1.803 m)     PainSc: 4   0-No pain     Isolation  Precautions No active isolations  Medications Medications  sodium chloride flush (NS) 0.9 % injection 3 mL (has  no administration in time range)  nitroGLYCERIN (NITROGLYN) 2 % ointment 1 inch (0 inches Topical Hold 07/28/18 0938)    Mobility walks Low fall risk   Focused Assessments Cardiac Assessment Handoff:  Cardiac Rhythm: Normal sinus rhythm Lab Results  Component Value Date   TROPONINI <0.03 12/04/2015   No results found for: DDIMER Does the Patient currently have chest pain? No     R Recommendations: See Admitting Provider Note  Report given to:   Additional Notes:

## 2018-07-29 DIAGNOSIS — R1310 Dysphagia, unspecified: Secondary | ICD-10-CM | POA: Diagnosis not present

## 2018-07-29 DIAGNOSIS — I200 Unstable angina: Secondary | ICD-10-CM | POA: Diagnosis not present

## 2018-07-29 DIAGNOSIS — I25110 Atherosclerotic heart disease of native coronary artery with unstable angina pectoris: Secondary | ICD-10-CM | POA: Diagnosis not present

## 2018-07-29 DIAGNOSIS — R0609 Other forms of dyspnea: Secondary | ICD-10-CM | POA: Diagnosis not present

## 2018-07-29 NOTE — Discharge Instructions (Signed)
° °Dysphagia ° °Dysphagia is trouble swallowing. This condition occurs when solids and liquids stick in a person's throat on the way down to the stomach, or when food takes longer to get to the stomach. You may have problems swallowing food, liquids, or both. You may also have pain while trying to swallow. It may take you more time and effort to swallow something. °What are the causes? °This condition is caused by: °· Problems with the muscles. They may make it difficult for you to move food and liquids through the tube that connects your mouth to your stomach (esophagus). You may have ulcers, scar tissue, or inflammation that blocks the normal passage of food and liquids. Causes of these problems include: °? Acid reflux from your stomach into your esophagus (gastroesophageal reflux). °? Infections. °? Radiation treatment for cancer. °? Medicines taken without enough fluids to wash them down into your stomach. °· Nerve problems. These prevent signals from being sent to the muscles of your esophagus to squeeze (contract) and move what you swallow down to your stomach. °· Globus pharyngeus. This is a common problem that involves feeling like something is stuck in the throat or a sense of trouble with swallowing even though nothing is wrong with the swallowing passages. °· Stroke. This can affect the nerves and make it difficult to swallow. °· Certain conditions, such as cerebral palsy or Parkinson disease. °What are the signs or symptoms? °Common symptoms of this condition include: °· A feeling that solids or liquids are stuck in your throat on the way down to the stomach. °· Food taking too long to get to the stomach. °Other symptoms include: °· Food moving back from your stomach to your mouth (regurgitation). °· Noises coming from your throat. °· Chest discomfort with swallowing. °· A feeling of fullness when swallowing. °· Drooling, especially when the throat is blocked. °· Pain while  swallowing. °· Heartburn. °· Coughing or gagging while trying to swallow. °How is this diagnosed? °This condition is diagnosed by: °· Barium X-ray. In this test, you swallow a white substance (contrast medium)that sticks to the inside of your esophagus. X-ray images are then taken. °· Endoscopy. In this test, a flexible telescope is inserted down your throat to look at your esophagus and your stomach. °· CT scans and MRI. °How is this treated? °Treatment for dysphagia depends on the cause of the condition: °· If the dysphagia is caused by acid reflux or infection, medicines may be used. They may include antibiotics and heartburn medicines. °· If the dysphagia is caused by problems with your muscles, swallowing therapy may be used to help you strengthen your swallowing muscles. You may have to do specific exercises to strengthen the muscles or stretch them. °· If the dysphagia is caused by a blockage or mass, procedures to remove the blockage may be done. You may need surgery and a feeding tube. °You may need to make diet changes. Ask your health care provider for specific instructions. °Follow these instructions at home: °Eating and drinking °· Try to eat soft food that is easier to swallow. °· Follow any diet changes as told by your health care provider. °· Cut your food into small pieces and eat slowly. °· Eat and drink only when you are sitting upright. °· Do not drink alcohol or caffeine. If you need help quitting, ask your health care provider. °General instructions °· Check your weight every day to make sure you are not losing weight. °· Take over-the-counter and prescription medicines   only as told by your health care provider. °· If you were prescribed an antibiotic medicine, take it as told by your health care provider. Do not stop taking the antibiotic even if you start to feel better. °· Do not use any products that contain nicotine or tobacco, such as cigarettes and e-cigarettes. If you need help  quitting, ask your health care provider. °· Keep all follow-up visits as told by your health care provider. This is important. °Contact a health care provider if: °· You lose weight because you cannot swallow. °· You cough when you drink liquids (aspiration). °· You cough up partially digested food. °Get help right away if: °· You cannot swallow your saliva. °· You have shortness of breath or a fever, or both. °· You have a hoarse voice and also have trouble swallowing. °Summary °· Dysphagia is trouble swallowing. This condition occurs when solids and liquids stick in a person's throat on the way down to the stomach, or when food takes longer to get to the stomach. °· Dysphagia has many possible causes and symptoms. °· Treatment for dysphagia depends on the cause of the condition. °This information is not intended to replace advice given to you by your health care provider. Make sure you discuss any questions you have with your health care provider. °Document Released: 12/27/1999 Document Revised: 12/11/2016 Document Reviewed: 12/19/2015 °Elsevier Patient Education © 2020 [employer] ° ° °

## 2018-07-29 NOTE — Progress Notes (Signed)
Patient ambulated around nurses station twice with standby assistance. Tolerated well, no c/o chest pain. HR stable. Discharge instructions given to patient. Verbalized understanding. No acute distress at this time. Daughter to transport patient home.

## 2018-07-29 NOTE — Progress Notes (Signed)
Progress Note  Patient Name: Jordan Figueroa Date of Encounter: 07/29/2018  Primary Cardiologist: Nelva Bush, MD  Subjective   No chest pain or sob.  Ambulating w/o difficulty.  Eager to go home.  Inpatient Medications    Scheduled Meds: . apixaban  5 mg Oral BID  . finasteride  5 mg Oral q1800  . furosemide  40 mg Oral Daily  . gabapentin  100 mg Oral TID  . isosorbide mononitrate  15 mg Oral Daily  . metoprolol succinate  12.5 mg Oral Daily  . nitroGLYCERIN  1 inch Topical Once   Continuous Infusions:  PRN Meds: acetaminophen, ondansetron (ZOFRAN) IV   Vital Signs    Vitals:   07/28/18 1528 07/28/18 1940 07/29/18 0335 07/29/18 0730  BP: 112/69 130/70 120/66 124/70  Pulse: 70 79 62 63  Resp: 19   18  Temp: 98.8 F (37.1 C) 98.9 F (37.2 C) 98.9 F (37.2 C)   TempSrc: Oral Oral Oral   SpO2: 97% 97% 94% 94%  Weight:      Height:        Intake/Output Summary (Last 24 hours) at 07/29/2018 0741 Last data filed at 07/29/2018 0709 Gross per 24 hour  Intake -  Output 1400 ml  Net -1400 ml   Filed Weights   07/28/18 0822 07/28/18 1312  Weight: 99.8 kg 95.1 kg    Physical Exam   GEN: Well nourished, well developed, in no acute distress.  HEENT: Grossly normal.  Neck: Supple, no JVD, carotid bruits, or masses. Cardiac: RRR, no murmurs, rubs, or gallops. No clubbing, cyanosis, edema.  Radials/DP/PT 2+ and equal bilaterally.  Respiratory:  Respirations regular and unlabored, clear to auscultation bilaterally. GI: Soft, nontender, nondistended, BS + x 4. MS: no deformity or atrophy. Skin: warm and dry, no rash. Neuro:  Strength and sensation are intact. Psych: AAOx3.  Normal affect.  Labs    Chemistry Recent Labs  Lab 07/28/18 0828  NA 136  K 3.9  CL 102  CO2 24  GLUCOSE 156*  BUN 16  CREATININE 1.12  CALCIUM 8.5*  GFRNONAA 58*  GFRAA >60  ANIONGAP 10     Hematology Recent Labs  Lab 07/28/18 0828  WBC 8.6  RBC 5.27  HGB 15.4  HCT  45.5  MCV 86.3  MCH 29.2  MCHC 33.8  RDW 13.4  PLT 194    High Sensitivity Troponin:   Recent Labs  Lab 07/28/18 0828 07/28/18 1008 07/28/18 1438 07/28/18 1641  TROPONINIHS 23* 18* 36* 38*      Radiology    Dg Chest Port 1 View  Result Date: 07/28/2018 CLINICAL DATA:  Substernal chest pain. EXAM: PORTABLE CHEST 1 VIEW COMPARISON:  December 04, 2015 FINDINGS: Cardiomediastinal silhouette is normal. Mediastinal contours appear intact. There is no evidence of focal airspace consolidation, pleural effusion or pneumothorax. Osseous structures are without acute abnormality. Soft tissues are grossly normal. IMPRESSION: No active disease. Electronically Signed   By: Fidela Salisbury M.D.   On: 07/28/2018 09:12    Telemetry    RSR, PACs, PVCs, ventricular couplets - Personally Reviewed  Cardiac Studies   2D Echocardiogram 7.16.2020   1. The left ventricle has normal systolic function with an ejection fraction of 60-65%. The cavity size was normal. Left ventricular diastolic Doppler parameters are consistent with impaired relaxation.  2. The right ventricle has normal systolic function. The cavity was normal. There is no increase in right ventricular wall thickness. Unable to estimate RVSP _____________  Lexiscan Cardiolite 3.31.2020  Pharmacological myocardial perfusion imaging study with no significant  ischemia Normal wall motion, EF estimated at 40% (GI uptake artifact noted, possibly contributing to depressed EF) No EKG changes concerning for ischemia at peak stress or in recovery. Rare PVC Low risk scan _____________   Patient Profile     85 y.o. male w/ a h/o chronic dyspnea, prior PE on chronic eliquis, chest pain w/ nonobs CAD by cath in 2017, reported PAF, PSVT, presyncope, HH, and GERD, who was admitted 7/16 w/ c/p after eating a ham and cheese sandwich and was found to have mild troponin elevation w/ relatively flat trend 23  18  36  38.  Assessment & Plan     1.  Chest pain/non-obstructive CAD/Elevated troponin: Pt w/ prior h/o chest pain and nonobs CAD by cath in 2017.  More recently, neg MV in 03/2018.  He presented yesterday following a 30 min bout of chest pain that began after eating a ham and cheese sandwich.  ECG non-acute.  Troponin has been mildly elevated w/ a relatively flat trend @ 23  18  36  38. Echo showed nl LV fxn.  No recurrent chest pain.  Ambulating w/o difficulty.  No plan for further ischemic eval @ this time.  Ok to d/c from cardiac perspective on current regimen.  2.  H/o PE:  Cont eliquis.  3.  Reported h/o PAF: Maintaining sinus.  Cont  blocker and eliquis.  4.  PSVT:  Quiescent on  blocker.  Signed, Murray Hodgkins, NP  07/29/2018, 7:41 AM    For questions or updates, please contact   Please consult [email or web address] for contact info under Cardiology/STEMI.

## 2018-07-31 NOTE — Discharge Summary (Signed)
[facility] NAME: Jordan Figueroa    MR#:  [id number]  DATE OF BIRTH:  [birth year]  DATE OF ADMISSION:  07/28/2018   ADMITTING PHYSICIAN: Max Sane, MD  DATE OF DISCHARGE: 07/29/2018 10:00 AM  PRIMARY CARE PHYSICIAN: Kirk Ruths, MD   ADMISSION DIAGNOSIS:  Unstable angina ([place]) [I20.0] SOB (shortness of breath) [R06.02] DISCHARGE DIAGNOSIS:  Principal Problem:   Unstable angina (HCC) Active Problems:   Dyspnea on exertion   CAD in native artery   Dysphagia  SECONDARY DIAGNOSIS:   Past Medical History:  Diagnosis Date  . Cancer ([place])    skin  . Colon polyps   . Dyspnea on exertion    a. 03/2015 Echo: Ef 60-65%, Gr1 DD, mild MR, nl RV fxn, mild PAH; b. 06/2015 RHC: minimal elevated PCWP; c.  04/2017 Echo: EF 60-65%, no rwma, GR1 DD, triv AI, mild MR, mildly dil LA, nl RV fxn, nl PASP.  Marland Kitchen GERD (gastroesophageal reflux disease)   . Hemorrhoids   . Hiatal hernia   . IBS (irritable bowel syndrome)   . Non-obstructive CAD (coronary artery disease)    a. 06/2015 Cath: LM 5, LAD 5ost, 30p, 97m, LCX nl, OM1 min irregs, OM2 nl, RCA 20p, 35m, RPDA/RPL1 min irregs, RPAV nl; b. 06/2017 Lexiscan MV: EF 55-65%, low risk.  Marland Kitchen PAF (paroxysmal atrial fibrillation) ([place])    a. Reported - no documentation in our records. Never seen on monitoring.  . Paroxysmal supraventricular tachycardia [facility]) March 2017   First documented during episode of influenza  . Pulmonary embolism ([place])    a. Chronic eliquis.  Marland Kitchen PVC's (premature ventricular contractions)    a. 06/2017 24h Holter: Avg HR 67 (49-110), rare PACs up to 5 beat run, freq PVC's - 8% burden; b. 08/2017 Zio: Avg HR 64 (48-171), occas PACs & PVCs. 4 episodes of NSVT (4 beats longest). 48 episodes of SVT up to 12.4 secs, max rate 171.   HOSPITAL COURSE:  85 y.o. male w/ a h/o chronic dyspnea, prior PE on chronic eliquis, chest pain w/ nonobs CAD by cath in 2017, reported PAF, PSVT,  presyncope, HH, and GERD, who was admitted 7/16 w/ c/p after eating a ham and cheese sandwich and was found to have mild troponin elevation w/ relatively flat trend 23  18  36  38.  1.  Chest pain/non-obstructive CAD/Elevated troponin: Pt w/ prior h/o chest pain and nonobs CAD by cath in 2017.  More recently, neg MV in 03/2018.  He presented yesterday following a 30 min bout of chest pain that began after eating a ham and cheese sandwich.  ECG non-acute.  Troponin has been mildly elevated w/ a relatively flat trend @ 23  18  36  38. Echo showed nl LV fxn.  No recurrent chest pain.  Ambulating w/o difficulty.  No plan for further ischemic eval @ this time.  Ok to d/c from cardiac perspective on current regimen.  2.  H/o PE:  Cont eliquis.  3. Reported h/o PAF: Maintaining sinus.  Cont ? blocker and eliquis.  4.  Paroxysmal SVT On metoprolol, normal sinus rhythm  5. Shortness of breath, chronic Likely multifactorial including his morbid obesity, deconditioning, prior history of PE Reports compliance with his anticoagulation Suggested he continue his walking program  6. Dysphagia - outpatient GI eval including possible EGD DISCHARGE CONDITIONS:  stable CONSULTS OBTAINED:  Treatment Team:  Minna [place]s, MD DRUG ALLERGIES:  No Known  Allergies DISCHARGE MEDICATIONS:   Allergies as of 07/29/2018   No Known Allergies     Medication List    TAKE these medications   apixaban 5 MG Tabs tablet Commonly known as: Eliquis Take 1 tablet (5 mg total) by mouth 2 (two) times daily.   finasteride 5 MG tablet Commonly known as: PROSCAR Take 1 tablet (5 mg total) by mouth daily.   furosemide 40 MG tablet Commonly known as: LASIX Take 1 tablet (40 mg total) by mouth daily.   gabapentin 100 MG capsule Commonly known as: NEURONTIN Take 100 mg by mouth 3 (three) times daily.   isosorbide mononitrate 30 MG 24 hr tablet Commonly known as: IMDUR Take 0.5 tablets (15 mg total) by  mouth daily.   metoprolol succinate 25 MG 24 hr tablet Commonly known as: TOPROL-XL Take 12.5 mg by mouth daily.   nitroGLYCERIN 0.4 MG SL tablet Commonly known as: Nitrostat Place 1 tablet (0.4 mg total) under the tongue every 5 (five) minutes as needed for chest pain. Maximum of 3 doses.        DISCHARGE INSTRUCTIONS:   DIET:  Cardiac diet DISCHARGE CONDITION:  Good ACTIVITY:  Activity as tolerated OXYGEN:  Home Oxygen: No.  Oxygen Delivery: room air DISCHARGE LOCATION:  home   If you experience worsening of your admission symptoms, develop shortness of breath, life threatening emergency, suicidal or homicidal thoughts you must seek medical attention immediately by calling 911 or calling your MD immediately  if symptoms less severe.  You Must read complete instructions/literature along with all the possible adverse reactions/side effects for all the Medicines you take and that have been prescribed to you. Take any new Medicines after you have completely understood and accpet all the possible adverse reactions/side effects.   Please note  You were cared for by a hospitalist during your hospital stay. If you have any questions about your discharge medications or the care you received while you were in the hospital after you are discharged, you can call the unit and asked to speak with the hospitalist on call if the hospitalist that took care of you is not available. Once you are discharged, your primary care physician will handle any further medical issues. Please note that NO REFILLS for any discharge medications will be authorized once you are discharged, as it is imperative that you return to your primary care physician (or establish a relationship with a primary care physician if you do not have one) for your aftercare needs so that they can reassess your need for medications and monitor your lab values.    On the day of Discharge:  VITAL SIGNS:  Blood pressure 124/70,  pulse 63, temperature 98.9 F (37.2 C), temperature source Oral, resp. rate 18, height 5\' 11"  (1.803 m), weight 95.1 kg, SpO2 94 %. PHYSICAL EXAMINATION:  GENERAL:  85 y.o.-year-old patient lying in the bed with no acute distress.  EYES: Pupils equal, round, reactive to light and accommodation. No scleral icterus. Extraocular muscles intact.  HEENT: Head atraumatic, normocephalic. Oropharynx and nasopharynx clear.  NECK:  Supple, no jugular venous distention. No thyroid enlargement, no tenderness.  LUNGS: Normal breath sounds bilaterally, no wheezing, rales,rhonchi or crepitation. No use of accessory muscles of respiration.  CARDIOVASCULAR: S1, S2 normal. No murmurs, rubs, or gallops.  ABDOMEN: Soft, non-tender, non-distended. Bowel sounds present. No organomegaly or mass.  EXTREMITIES: No pedal edema, cyanosis, or clubbing.  NEUROLOGIC: Cranial nerves II through XII are intact. Muscle strength 5/5 in all extremities.  Sensation intact. Gait not checked.  PSYCHIATRIC: The patient is alert and oriented x 3.  SKIN: No obvious rash, lesion, or ulcer.  DATA REVIEW:   CBC Recent Labs  Lab 07/28/18 0828  WBC 8.6  HGB 15.4  HCT 45.5  PLT 194    Chemistries  Recent Labs  Lab 07/28/18 0828  NA 136  K 3.9  CL 102  CO2 24  GLUCOSE 156*  BUN 16  CREATININE 1.12  CALCIUM 8.5*     Follow-up Information    Kirk Ruths, MD. Go on 08/08/2018.   Specialty: Internal Medicine Why: appointment at 2:15pm Contact information: [address] [facility] [place] [address] [phone]        Nelva Bush, MD. Go on 08/09/2018.   Specialty: Cardiology Why: appointment at [facility] information: [place] [address] [phone]        Jonathon Bellows, MD. Schedule an appointment as soon as possible for a visit on 08/18/2018.   Specialty: Gastroenterology Why: appointment at 3:15pm Contact information: [place] [address] [phone]           Management plans discussed with the patient, family and they are in agreement.  CODE STATUS: Prior   TOTAL TIME TAKING CARE OF THIS PATIENT: 45 minutes.    Max Sane M.D on 07/31/2018 at 5:02 PM  Between 7am to 6pm - Pager - [phone]  After 6pm go to [email or web address] - [employer] [facility]  Office  [phone]  CC: Primary care physician; Kirk Ruths, MD   Note: This dictation was prepared with Dragon dictation along with smaller phrase technology. Any transcriptional errors that result from this process are unintentional.

## 2018-08-07 NOTE — Progress Notes (Signed)
Cardiology Office Note Date:  08/09/2018  Jordan Figueroa ID:  Jordan Figueroa, DOB 03/09/1927, MRN [id number] PCP:  Kirk Ruths, MD  Cardiologist:  Dr. Saunders Revel, MD    Chief Complaint: Hospital follow up  History of Present Illness: Jordan Figueroa is a 85 y.o. male with history of nonobstructive CAD by LHC in 2017, PE on Eliquis, chronic SOB, reported PAF with no documentation existing in our records, paroxysmal SVT, presyncope/dizziness, hiatal hernia, IBS, and GERD  who presents for hospital follow-up after his admission to [facility] from 7/16 through 7/17 for atypical chest pain after eating a ham and cheese sandwich.  Jordan Figueroa underwent R/LHC in 06/2015 that showed mild diffuse disease involving the left main,20 to 25% proximal and mid/distal RCA stenosis, mildly elevated left heart filling pressure, normal right heart filling and pulmonary arterial pressure, normal LVEF, with mildly reduced cardiac output/index. Very little data supported cardiac etiology of his symptoms. Echo just preceding his cath showed an EF of 60 to 37%, grade 1 diastolic dysfunction, mild mitral regurgitation, normal RV size and systolic function, mild pulmonary hypertension. He was seen in early to mid 2019 with dizziness/presyncope with noted PVCs on EKG with repeat echo showing EF of 60 to 65%, no regional wall motion abnormalities, grade 1 diastolic dysfunction, trivial aortic regurgitation, mild mitral regurgitation, mildly dilated left atrium, RV systolic function was normal, PASP normal, rare PVCs were noted. Later in 2019 he noted worsening SOB and underwent Lexiscan Myoview in 06/2017 that showed no significant ischemia, EF 55-65%, and was overall a low risk study. 24-hour Holter monitor showed an average heart rate of 67 bpm (range 49-110 bpm), longest R-R interval of 1.7 seconds, rare PACs including a 5 beat atrial run, frequent PVCs including couplets, triplets, as well as bigeminy were noted with an overall PVC burden of  8%. No sustained arrhythmia or prolonged pause was noted. Jordan Figueroa's reported SOB corresponded to sinus rhythm with PVCs. Due to continued symptoms, he underwent 14-day Zio monitoring in 08/2017 that showed a predominant rhythm of sinus with an average heart rate of 64 bpm with a range of 48-171 bpm, occasional PACs/PVCs including couplets and triplets, 4 episodes of NSVT lasting up to 4 beats, 48 episodes of SVT lasting up to 12.4 seconds with a maximal rate of 171 bpm, no sustained arrhythmias or prolonged pauses were noted. Jordan Figueroa triggered events corresponded to sinus rhythm with PVCs. More recently, he was seen in 03/2018 with accelerating angina with continued exertional dyspnea dating back to his prior PE. In this setting, he underwent Lexiscan Myoview on 04/12/2018 that showed no significant ischemia, EF 40% with GI uptake artifact possibly contributing, no EKG changes concerning for ischemia, rare PVCs. Overall, this was a low risk study. With this, he was started on Imdur.  Jordan Figueroa was admitted to the hospital 07/28/2018 with atypical chest pain after eating a ham and cheese sandwich.  Jordan Figueroa indicated for the past several months he has noted worsening dysphagia with increased issues getting food stuck in his esophagus.  He denied any prior EGD.  He continued to be able to ambulate 0.75 miles 4 days/week without any symptoms of angina.  EKG showed sinus rhythm, left axis deviation, with poor R wave progression along the precordial leads, and PVCs.  High-sensitivity troponin was initially 23 with delta of 18 with repeat noted to be 36 followed by 38.  Jordan Figueroa was able to ambulate without difficulty.  Echo showed an EF of 60 to 16%, diastolic dysfunction, normal RV systolic  function, normal RV cavity size, no significant valvular abnormalities.  Jordan Figueroa indicated he was compliant with Eliquis in the setting of his prior PE.  Symptoms were felt to be noncardiac with no inpatient testing needed.  Jordan Figueroa  comes in doing well from a cardiac perspective.  He denies any further chest pain.  He does continue to note some exertional shortness of breath and palpitations which are unchanged over the past several years.  He continues to note significant dysphasia and indicates he got a piece of food stuck 1 week prior and had quite a difficult time getting it down.  He does report having his initial appointment with GI next week for evaluation of this.  He denies any falls, BRBPR, melena, hematuria, hematemesis, or hemoptysis.  He does note some mild bilateral ankle swelling which is typically resolved first thing in the morning and worsens as the day progresses.  He does not elevate his legs when sitting or wear compression stockings.  No abdominal distention, orthopnea, PND, or early satiety.  Compliant with Eliquis for history of PE.  Labs: LDL 61, WBC 8.6, Hgb 15.4, PLT 194, potassium 3.9, serum creatinine 1.12   Past Medical History:  Diagnosis Date   Cancer ([place])    skin   Colon polyps    Dyspnea on exertion    a. 03/2015 Echo: Ef 60-65%, Gr1 DD, mild MR, nl RV fxn, mild PAH; b. 06/2015 RHC: minimal elevated PCWP; c.  04/2017 Echo: EF 60-65%, no rwma, GR1 DD, triv AI, mild MR, mildly dil LA, nl RV fxn, nl PASP.   GERD (gastroesophageal reflux disease)    Hemorrhoids    Hiatal hernia    IBS (irritable bowel syndrome)    Non-obstructive CAD (coronary artery disease)    a. 06/2015 Cath: LM 5, LAD 5ost, 30p, 59m, LCX nl, OM1 min irregs, OM2 nl, RCA 20p, 66m, RPDA/RPL1 min irregs, RPAV nl; b. 06/2017 Lexiscan MV: EF 55-65%, low risk.   PAF (paroxysmal atrial fibrillation) ([place])    a. Reported - no documentation in our records. Never seen on monitoring.   Paroxysmal supraventricular tachycardia [facility]) March 2017   First documented during episode of influenza   Pulmonary embolism ([place])    a. Chronic eliquis.   PVC's (premature ventricular contractions)    a. 06/2017 24h Holter: Avg HR 67  (49-110), rare PACs up to 5 beat run, freq PVC's - 8% burden; b. 08/2017 Zio: Avg HR 64 (48-171), occas PACs & PVCs. 4 episodes of NSVT (4 beats longest). 48 episodes of SVT up to 12.4 secs, max rate 171.    Past Surgical History:  Procedure Laterality Date   CARDIAC CATHETERIZATION N/A 06/28/2015   Procedure: Left Heart Cath and Coronary Angiography;  Surgeon: Leonie Man, MD;  Location: [facility];  Service: Cardiovascular;  Laterality: N/A;; proximal and mid RCA focal 20-25% lesions. Mild diffuse calcifications in the left main and proximal LAD ~30%, LVEDP 18 mmHg   CARDIAC CATHETERIZATION  06/28/2015   Procedure: Right Heart Cath;  Surgeon: Leonie Man, MD;  Location: [facility];  Service: Cardiovascular;; essentially normal right heart cath pressures. Cardiac Output 4.74.Normal wedge pressure roughly 12 mmHg.    COLON SURGERY  07/14/2013   small bowel obstruction   SKIN SURGERY     TRANSTHORACIC ECHOCARDIOGRAM  04/10/2015   EF 60-65%. No RWMA, GR 1 DD. Mild PA pressure elevation of 38 mmHg.    Current Meds  Medication Sig   apixaban (ELIQUIS)  5 MG TABS tablet Take 1 tablet (5 mg total) by mouth 2 (two) times daily.   finasteride (PROSCAR) 5 MG tablet Take 1 tablet (5 mg total) by mouth daily.   gabapentin (NEURONTIN) 100 MG capsule Take 100 mg by mouth 3 (three) times daily.   isosorbide mononitrate (IMDUR) 30 MG 24 hr tablet Take 0.5 tablets (15 mg total) by mouth daily.   metoprolol succinate (TOPROL-XL) 25 MG 24 hr tablet Take 12.5 mg by mouth daily.   nitroGLYCERIN (NITROSTAT) 0.4 MG SL tablet Place 1 tablet (0.4 mg total) under the tongue every 5 (five) minutes as needed for chest pain. Maximum of 3 doses.    Allergies:   Jordan Figueroa has no known allergies.   Social History:  The Jordan Figueroa  reports that he quit smoking about 45 years ago. His smoking use included cigarettes. He has a 50.00 pack-year smoking history. He has never used smokeless tobacco. He  reports that he does not drink alcohol or use drugs.   Family History:  The Jordan Figueroa's family history includes Alzheimer's disease in his mother; CVA in his father; Heart Problems in his sister; Heart attack in his brother.  ROS:   Review of Systems  Constitutional: Positive for malaise/fatigue. Negative for chills, diaphoresis, fever and weight loss.  HENT: Negative for congestion.   Eyes: Negative for discharge and redness.  Respiratory: Positive for shortness of breath. Negative for cough, hemoptysis, sputum production and wheezing.   Cardiovascular: Positive for palpitations and leg swelling. Negative for chest pain, orthopnea, claudication and PND.       Bilateral ankle swelling unchanged for many years  Gastrointestinal: Negative for abdominal pain, blood in stool, constipation, diarrhea, heartburn, melena, nausea and vomiting.       Dysphagia  Genitourinary: Negative for hematuria.  Musculoskeletal: Negative for falls and myalgias.  Skin: Negative for rash.  Neurological: Negative for dizziness, tingling, tremors, sensory change, speech change, focal weakness, loss of consciousness and weakness.  Endo/Heme/Allergies: Does not bruise/bleed easily.  Psychiatric/Behavioral: Negative for substance abuse. The Jordan Figueroa is not nervous/anxious.   All other systems reviewed and are negative.    PHYSICAL EXAM:  VS:  BP 140/74 (BP Location: Left Arm, Jordan Figueroa Position: Sitting, Cuff Size: Normal)    Pulse 83    Temp (!) 97.3 F (36.3 C)    Ht 5\' 11"  (1.803 m)    Wt 220 lb (99.8 kg)    SpO2 94%    BMI 30.68 kg/m  BMI: Body mass index is 30.68 kg/m.  Physical Exam  Constitutional: He is oriented to person, place, and time. He appears well-developed and well-nourished.  HENT:  Head: Normocephalic and atraumatic.  Eyes: Right eye exhibits no discharge. Left eye exhibits no discharge.  Neck: Normal range of motion. No JVD present.  Cardiovascular: Normal rate, S1 normal, S2 normal and normal  heart sounds. An irregular rhythm present. Exam reveals no distant heart sounds, no friction rub, no midsystolic click and no opening snap.  No murmur heard. Pulmonary/Chest: Effort normal and breath sounds normal. No respiratory distress. He has no decreased breath sounds. He has no wheezes. He has no rales. He exhibits no tenderness.  Abdominal: Soft. He exhibits no distension. There is no abdominal tenderness.  Musculoskeletal:        General: Edema present.     Comments: Trace bilateral ankle edema  Neurological: He is alert and oriented to person, place, and time.  Skin: Skin is warm and dry. No cyanosis. Nails show no clubbing.  Psychiatric: He has a normal mood and affect. His speech is normal and behavior is normal. Judgment and thought content normal.     EKG:  Was ordered and interpreted by me today. Shows NSR, 83 bpm, first-degree AV block, frequent PVCs, occasional PACs, left axis deviation, incomplete RBBB (grossly unchanged from prior)  Recent Labs: 09/15/2017: Magnesium 2.2; TSH 2.760 07/28/2018: BUN 16; Creatinine, Ser 1.12; Hemoglobin 15.4; Platelets 194; Potassium 3.9; Sodium 136  07/28/2018: Cholesterol 109; HDL 38; LDL Cholesterol 61; Total CHOL/HDL Ratio 2.9; Triglycerides 48; VLDL 10   Estimated Creatinine Clearance: 54.9 mL/min (by C-G formula based on SCr of 1.12 mg/dL).   Wt Readings from Last 3 Encounters:  08/09/18 220 lb (99.8 kg)  07/28/18 209 lb 11.2 oz (95.1 kg)  05/18/18 220 lb (99.8 kg)     Other studies reviewed: Additional studies/records reviewed today include: summarized above  ASSESSMENT AND PLAN:  1. Nonobstructive CAD with atypical chest pain: Jordan Figueroa has undergone cardiac cath in 2017 demonstrating nonobstructive disease with several noninvasive ischemic evaluations being negative for significant ischemia since then and most recently on 04/12/2018.  Most recent presentation appeared to be in the setting of dysphagia getting a ham and cheese  sandwich stuck in his esophagus.  He continues to deal with significant dysphagia and denies any further chest pain.  Recommend continued medical therapy and risk factor modification from a cardiac perspective as well as follow-up with GI as outlined below.  He remains on Eliquis in place of aspirin.  Continue current dose of Imdur and hydrated metoprolol as below.  In follow-up recommend fasting lipid panel initiation of statin therapy if he is found to have significantly elevated LDL.  2. Paroxysmal SVT/frequent PVCs: Prior outpatient cardiac monitoring has demonstrated symptomatic shortness of breath and palpitations with noted frequent PVCs.  He continues to have occasional to frequent PVCs documented on telemetry during his recent admission and on twelve-lead EKG today.  In this setting, we will titrate his Toprol-XL to 25 mg daily and check BMP, magnesium, TSH, and CBC.  Recent ischemic evaluation unrevealing as outlined above.  Most recent echo demonstrated normal LV systolic function earlier this month.  Following titration of beta-blockade could consider repeat outpatient cardiac monitoring to quantify PVC burden on maximum tolerated beta-blocker.  3. HFpEF: Jordan Figueroa appears euvolemic and well compensated.  Weight has been stable at home.  I suspect his symptoms of exertional dyspnea are multifactorial including underlying diastolic heart failure, prior PE, obesity, physical deconditioning, and ectopy mediated.  Continue current dose of Lasix with escalation of metoprolol as above.  Check renal function electrolytes as above.  CHF education.  4. History of PE:  Remains on indefinite Eliquis which is managed by Jordan Figueroa's PCP.  Should the Jordan Figueroa require endoscopy for evaluation of his dysphagia management of Eliquis to be undertaken by PCP.  5. Dysphagia: Continues to be an issue.  Has an appointment with GI next week.  6. Reported PAF: No evidence of an epic.  Nonetheless, Jordan Figueroa remains on  Eliquis given history of PE as outlined above.  Multiple outpatient cardiac monitors within the past 12 months have not demonstrated evidence of A. fib.  Disposition: F/u with Dr. Saunders Revel or an APP in 1 month.  Current medicines are reviewed at length with the Jordan Figueroa today.  The Jordan Figueroa did not have any concerns regarding medicines.  Signed, [name], PA-C 08/09/2018 10:42 AM     [address], [place] [address] (  [phone] °

## 2018-08-08 ENCOUNTER — Telehealth: Payer: Self-pay | Admitting: Internal Medicine

## 2018-08-08 NOTE — Telephone Encounter (Signed)

## 2018-08-09 ENCOUNTER — Encounter: Payer: Self-pay | Admitting: Physician Assistant

## 2018-08-09 ENCOUNTER — Other Ambulatory Visit: Payer: Self-pay

## 2018-08-09 ENCOUNTER — Ambulatory Visit (INDEPENDENT_AMBULATORY_CARE_PROVIDER_SITE_OTHER): Payer: Medicare Other | Admitting: Physician Assistant

## 2018-08-09 VITALS — BP 140/74 | HR 83 | Temp 97.3°F | Ht 71.0 in | Wt 220.0 lb

## 2018-08-09 DIAGNOSIS — I5032 Chronic diastolic (congestive) heart failure: Secondary | ICD-10-CM

## 2018-08-09 DIAGNOSIS — I480 Paroxysmal atrial fibrillation: Secondary | ICD-10-CM | POA: Diagnosis not present

## 2018-08-09 DIAGNOSIS — I200 Unstable angina: Secondary | ICD-10-CM

## 2018-08-09 DIAGNOSIS — R1310 Dysphagia, unspecified: Secondary | ICD-10-CM

## 2018-08-09 DIAGNOSIS — I493 Ventricular premature depolarization: Secondary | ICD-10-CM

## 2018-08-09 DIAGNOSIS — Z86711 Personal history of pulmonary embolism: Secondary | ICD-10-CM

## 2018-08-09 DIAGNOSIS — I471 Supraventricular tachycardia: Secondary | ICD-10-CM | POA: Diagnosis not present

## 2018-08-09 DIAGNOSIS — I2510 Atherosclerotic heart disease of native coronary artery without angina pectoris: Secondary | ICD-10-CM | POA: Diagnosis not present

## 2018-08-09 DIAGNOSIS — R0789 Other chest pain: Secondary | ICD-10-CM

## 2018-08-09 MED ORDER — METOPROLOL SUCCINATE ER 25 MG PO TB24: 25.0000 mg | ORAL_TABLET | Freq: Every day | ORAL | 3 refills | Status: DC

## 2018-08-09 NOTE — Patient Instructions (Signed)
Medication Instructions:  Your physician has recommended you make the following change in your medication:  1- INCREASE Toprol- Take 1 tablet (25 mg total) by mouth daily. Take with or immediately following a meal. If you need a refill on your cardiac medications before your next appointment, please call your pharmacy.   Lab work: Your physician recommends that you have lab work today(BMET, Mag, Tsh, CBC)  If you have labs (blood work) drawn today and your tests are completely normal, you will receive your results only by: Marland Kitchen MyChart Message (if you have MyChart) OR . A paper copy in the mail If you have any lab test that is abnormal or we need to change your treatment, we will call you to review the results.  Testing/Procedures: None ordered   Follow-Up: At [facility], you and your health needs are our priority.  As part of our continuing mission to provide you with exceptional heart care, we have created designated Provider Care Teams.  These Care Teams include your primary Cardiologist (physician) and Advanced Practice Providers (APPs -  Physician Assistants and Nurse Practitioners) who all work together to provide you with the care you need, when you need it. You will need a follow up appointment in 1 months. You may see Nelva Bush, MD or [name], PA-C.

## 2018-08-10 LAB — CBC
Hematocrit: 45.8 % (ref 37.5–51.0)
Hemoglobin: 15.5 g/dL (ref 13.0–17.7)
MCH: 29.6 pg (ref 26.6–33.0)
MCHC: 33.8 g/dL (ref 31.5–35.7)
MCV: 88 fL (ref 79–97)
Platelets: 253 10*3/uL (ref 150–450)
RBC: 5.23 x10E6/uL (ref 4.14–5.80)
RDW: 13.6 % (ref 11.6–15.4)
WBC: 7.7 10*3/uL (ref 3.4–10.8)

## 2018-08-10 LAB — BASIC METABOLIC PANEL
BUN/Creatinine Ratio: 16 (ref 10–24)
BUN: 15 mg/dL (ref 8–27)
CO2: 26 mmol/L (ref 20–29)
Calcium: 8.9 mg/dL (ref 8.6–10.2)
Chloride: 102 mmol/L (ref 96–106)
Creatinine, Ser: 0.95 mg/dL (ref 0.76–1.27)
GFR calc Af Amer: 82 mL/min/{1.73_m2} (ref >59)
GFR calc non Af Amer: 71 mL/min/{1.73_m2} (ref >59)
Glucose: 146 mg/dL — ABNORMAL HIGH (ref 65–99)
Potassium: 3.9 mmol/L (ref 3.5–5.2)
Sodium: 141 mmol/L (ref 134–144)

## 2018-08-10 LAB — MAGNESIUM: Magnesium: 2.3 mg/dL (ref 1.6–2.3)

## 2018-08-10 LAB — TSH: TSH: 3.53 u[IU]/mL (ref 0.450–4.500)

## 2018-08-18 ENCOUNTER — Other Ambulatory Visit: Payer: Self-pay

## 2018-08-18 ENCOUNTER — Telehealth: Payer: Self-pay

## 2018-08-18 ENCOUNTER — Ambulatory Visit (INDEPENDENT_AMBULATORY_CARE_PROVIDER_SITE_OTHER): Payer: Medicare Other | Admitting: Gastroenterology

## 2018-08-18 VITALS — BP 140/65 | HR 64 | Temp 98.1°F | Ht 71.0 in | Wt 216.4 lb

## 2018-08-18 DIAGNOSIS — R1310 Dysphagia, unspecified: Secondary | ICD-10-CM | POA: Diagnosis not present

## 2018-08-18 DIAGNOSIS — I200 Unstable angina: Secondary | ICD-10-CM

## 2018-08-18 NOTE — Telephone Encounter (Signed)
   Primary Cardiologist: Nelva Bush, MD  Chart reviewed as part of pre-operative protocol coverage. Patient was contacted 08/18/2018 in reference to pre-operative risk assessment for pending surgery as outlined below.  Jordan Figueroa was last seen on 08/09/2018 by [name], PA-C.  Since that day, Jordan Figueroa has done fine from a cardiac standpoint. He denies recurrent chest pain. He has chronic DOE which is unchanged in recent months. He is fairly limited in activity but can complete 4 METs without angina.   Therefore, based on ACC/AHA guidelines, the patient would be at acceptable risk for the planned procedure without further cardiovascular testing.   He has a history of PE for which he takes eliquis. Eliquis is managed by his PCP, Dr. Frazier Richards, therefore recommendations for holding this medication should be directed towards him.   I will route this recommendation to the requesting party via Epic fax function and remove from pre-op pool.  Please call with questions.  Abigail Butts, PA-C 08/18/2018, 4:28 PM

## 2018-08-18 NOTE — Telephone Encounter (Signed)
   [facility] Pre-operative Risk Assessment    Request for surgical clearance:  1. What type of surgery is being performed? Upper Endoscopy   2. When is this surgery scheduled? 08/26/2018   3. What type of clearance is required (medical clearance vs. Pharmacy clearance to hold med vs. Both)? Both  4. Are there any medications that need to be held prior to surgery and how long? Eliquis 5 mg, 48 hours   5. Practice name and name of physician performing surgery? [facility], Dr. Jonathon Bellows   6. What is your office phone number [phone]    7.   What is your office fax number [phone]  8.   Anesthesia type (None, local, MAC, general) ? General   Rushie Chestnut 08/18/2018, 3:57 PM  _________________________________________________________________   (provider comments below)

## 2018-08-18 NOTE — Progress Notes (Signed)
Jonathon Bellows MD, MRCP(U.K) [address]  [place]  [place], [place] [address]  Main: [phone]  Fax: [phone]   Gastroenterology Consultation  Referring Provider:     Kirk Ruths, MD Primary Care Physician:  Kirk Ruths, MD Primary Gastroenterologist:  Dr. Jonathon Bellows  Reason for Consultation:     Dysphagia        HPI:   Jordan Figueroa is a 85 y.o. y/o male referred for dysphagia.  He was recently admitted to the hospital discharge on 07/29/2018 for unstable angina and shortness of breath.  He is on Eliquis.  History of paroxysmal atrial fibrillation and paroxysmal supraventricular tachycardia.  At some point there was some concern for dysphagia and hence was referred to see [place] as an outpatient.  Seen by cardiology on 08/09/2018.  He states that he has had issues with swallowing for very long.  Of time but recently has got worse.  Mostly affects solids.  He has had times when the food got stuck and he had to pick up to get it back up.  Denies any weight loss.  Past Medical History:  Diagnosis Date  . Cancer ([place])    skin  . Colon polyps   . Dyspnea on exertion    a. 03/2015 Echo: Ef 60-65%, Gr1 DD, mild MR, nl RV fxn, mild PAH; b. 06/2015 RHC: minimal elevated PCWP; c.  04/2017 Echo: EF 60-65%, no rwma, GR1 DD, triv AI, mild MR, mildly dil LA, nl RV fxn, nl PASP.  Marland Kitchen GERD (gastroesophageal reflux disease)   . Hemorrhoids   . Hiatal hernia   . IBS (irritable bowel syndrome)   . Non-obstructive CAD (coronary artery disease)    a. 06/2015 Cath: LM 5, LAD 5ost, 30p, 88m, LCX nl, OM1 min irregs, OM2 nl, RCA 20p, 55m, RPDA/RPL1 min irregs, RPAV nl; b. 06/2017 Lexiscan MV: EF 55-65%, low risk.  Marland Kitchen PAF (paroxysmal atrial fibrillation) ([place])    a. Reported - no documentation in our records. Never seen on monitoring.  . Paroxysmal supraventricular tachycardia [facility]) March 2017   First documented during episode of influenza  . Pulmonary embolism ([place])    a. Chronic  eliquis.  Marland Kitchen PVC's (premature ventricular contractions)    a. 06/2017 24h Holter: Avg HR 67 (49-110), rare PACs up to 5 beat run, freq PVC's - 8% burden; b. 08/2017 Zio: Avg HR 64 (48-171), occas PACs & PVCs. 4 episodes of NSVT (4 beats longest). 48 episodes of SVT up to 12.4 secs, max rate 171.    Past Surgical History:  Procedure Laterality Date  . CARDIAC CATHETERIZATION N/A 06/28/2015   Procedure: Left Heart Cath and Coronary Angiography;  Surgeon: Leonie Man, MD;  Location: [facility];  Service: Cardiovascular;  Laterality: N/A;; proximal and mid RCA focal 20-25% lesions. Mild diffuse calcifications in the left main and proximal LAD ~30%, LVEDP 18 mmHg  . CARDIAC CATHETERIZATION  06/28/2015   Procedure: Right Heart Cath;  Surgeon: Leonie Man, MD;  Location: [facility];  Service: Cardiovascular;; essentially normal right heart cath pressures. Cardiac Output 4.74.Normal wedge pressure roughly 12 mmHg.   Marland Kitchen COLON SURGERY  07/14/2013   small bowel obstruction  . SKIN SURGERY    . TRANSTHORACIC ECHOCARDIOGRAM  04/10/2015   EF 60-65%. No RWMA, GR 1 DD. Mild PA pressure elevation of 38 mmHg.    Prior to Admission medications   Medication Sig Start Date End Date Taking? Authorizing Provider  apixaban Arne [place])  5 MG TABS tablet Take 1 tablet (5 mg total) by mouth 2 (two) times daily. 05/07/15   Leonie Man, MD  finasteride (PROSCAR) 5 MG tablet Take 1 tablet (5 mg total) by mouth daily. 01/26/18   Stoioff, Ronda Fairly, MD  furosemide (LASIX) 40 MG tablet Take 1 tablet (40 mg total) by mouth daily. 09/01/17 07/28/18  Evans Lance, MD  gabapentin (NEURONTIN) 100 MG capsule Take 100 mg by mouth 3 (three) times daily.    [provider]  isosorbide mononitrate (IMDUR) 30 MG 24 hr tablet Take 0.5 tablets (15 mg total) by mouth daily. 05/24/18 08/22/18  End, Harrell Gave, MD  metoprolol succinate (TOPROL-XL) 25 MG 24 hr tablet Take 1 tablet (25 mg total) by mouth daily. Take  with or immediately following a meal. 08/09/18 11/07/18  Dunn, Areta Haber, PA-C  nitroGLYCERIN (NITROSTAT) 0.4 MG SL tablet Place 1 tablet (0.4 mg total) under the tongue every 5 (five) minutes as needed for chest pain. Maximum of 3 doses. 04/13/18   End, Harrell Gave, MD    Family History  Problem Relation Age of Onset  . CVA Father   . Alzheimer's disease Mother   . Heart Problems Sister   . Heart attack Brother   . Prostate cancer Neg Hx   . Bladder Cancer Neg Hx   . Kidney cancer Neg Hx      Social History   Tobacco Use  . Smoking status: Former Smoker    Packs/day: 2.00    Years: 25.00    Pack years: 50.00    Types: Cigarettes    Quit date: 02/25/1973    Years since quitting: 45.5  . Smokeless tobacco: Never Used  Substance Use Topics  . Alcohol use: No    Alcohol/week: 0.0 standard drinks  . Drug use: No    Allergies as of 08/18/2018  . (No Known Allergies)    Review of Systems:    All systems reviewed and negative except where noted in HPI.   Physical Exam:  There were no vitals taken for this visit. No LMP for male patient. Psych:  Alert and cooperative. Normal mood and affect. General:   Alert,  Well-developed, well-nourished, pleasant and cooperative in NAD Head:  Normocephalic and atraumatic. Eyes:  Sclera clear, no icterus.   Conjunctiva pink. Ears:  Normal auditory acuity. Nose:  No deformity, discharge, or lesions. Mouth:  No deformity or lesions,oropharynx pink & moist. Neck:  Supple; no masses or thyromegaly. Lungs:  Respirations even and unlabored.  Clear throughout to auscultation.   No wheezes, crackles, or rhonchi. No acute distress. Heart:  Regular rate and rhythm; no murmurs, clicks, rubs, or gallops. Abdomen:  Normal bowel sounds.  No bruits.  Soft, non-tender and non-distended without masses, hepatosplenomegaly or hernias noted.  No guarding or rebound tenderness.    Neurologic:  Alert and oriented x3;  grossly normal neurologically. Skin:  Intact  without significant lesions or rashes. No jaundice. Lymph Nodes:  No significant cervical adenopathy. Psych:  Alert and cooperative. Normal mood and affect.  Imaging Studies: Dg Chest Port 1 View  Result Date: 07/28/2018 CLINICAL DATA:  Substernal chest pain. EXAM: PORTABLE CHEST 1 VIEW COMPARISON:  December 04, 2015 FINDINGS: Cardiomediastinal silhouette is normal. Mediastinal contours appear intact. There is no evidence of focal airspace consolidation, pleural effusion or pneumothorax. Osseous structures are without acute abnormality. Soft tissues are grossly normal. IMPRESSION: No active disease. Electronically Signed   By: Fidela Salisbury M.D.   On: 07/28/2018  09:12    Assessment and Plan:   Jordan Figueroa is a 85 y.o. y/o male has been referred for dysphagia.  Longstanding but recently got worse.  Never had endoscopy.  Affect solids greater than liquids.  History suggestive of a stricture versus a Schatzki's ring.  He is on Eliquis.  Plan 1.  EGD with holding of Eliquis within 1 to 2 weeks.  I have discussed alternative options, risks & benefits,  which include, but are not limited to, bleeding, infection, perforation,respiratory complication & drug reaction.  The patient agrees with this plan & written consent will be obtained.     Follow up in 4 weeks   Dr Jonathon Bellows MD,MRCP(U.K)

## 2018-08-18 NOTE — Telephone Encounter (Signed)
Faxed to [facility] ATTN: Dr. Vicente Males via [facility] fax.

## 2018-08-22 ENCOUNTER — Telehealth: Payer: Self-pay | Admitting: Gastroenterology

## 2018-08-22 NOTE — Telephone Encounter (Signed)
Pt left vm he has a procedure on 08/26/18 and was supposed tp receive a call today from a nurse regarding stopping his rx Elequis please call pt he is concerned

## 2018-08-22 NOTE — Telephone Encounter (Signed)
Spoke with pt and informed him that per Dr. Ouida Sills [facility]) pt is okay to hold the Eliquis 2-3 days prior to the endoscopy procedure. Pt understands.

## 2018-08-23 ENCOUNTER — Other Ambulatory Visit
Admission: RE | Admit: 2018-08-23 | Discharge: 2018-08-23 | Disposition: A | Discharge status: Home / Self Care | Payer: Medicare Other | Source: Ambulatory Visit | Attending: Gastroenterology | Admitting: Gastroenterology

## 2018-08-23 ENCOUNTER — Other Ambulatory Visit: Payer: Self-pay

## 2018-08-23 DIAGNOSIS — Z01812 Encounter for preprocedural laboratory examination: Secondary | ICD-10-CM | POA: Diagnosis present

## 2018-08-23 DIAGNOSIS — Z20828 Contact with and (suspected) exposure to other viral communicable diseases: Secondary | ICD-10-CM | POA: Diagnosis not present

## 2018-08-23 LAB — SARS CORONAVIRUS 2 (TAT 6-24 HRS): SARS Coronavirus 2: NEGATIVE

## 2018-08-26 ENCOUNTER — Ambulatory Visit
Admission: RE | Admit: 2018-08-26 | Discharge: 2018-08-26 | Disposition: A | Discharge status: Home / Self Care | Payer: Medicare Other | Attending: Gastroenterology | Admitting: Gastroenterology

## 2018-08-26 ENCOUNTER — Encounter: Payer: Self-pay | Admitting: *Deleted

## 2018-08-26 ENCOUNTER — Ambulatory Visit: Payer: Medicare Other | Admitting: Anesthesiology

## 2018-08-26 ENCOUNTER — Encounter
Admission: RE | Disposition: A | Discharge status: Home / Self Care | Payer: Self-pay | Source: Home / Self Care | Attending: Gastroenterology

## 2018-08-26 DIAGNOSIS — Z87891 Personal history of nicotine dependence: Secondary | ICD-10-CM | POA: Insufficient documentation

## 2018-08-26 DIAGNOSIS — K2950 Unspecified chronic gastritis without bleeding: Secondary | ICD-10-CM | POA: Diagnosis not present

## 2018-08-26 DIAGNOSIS — K222 Esophageal obstruction: Secondary | ICD-10-CM | POA: Diagnosis not present

## 2018-08-26 DIAGNOSIS — Z85828 Personal history of other malignant neoplasm of skin: Secondary | ICD-10-CM | POA: Insufficient documentation

## 2018-08-26 DIAGNOSIS — I25119 Atherosclerotic heart disease of native coronary artery with unspecified angina pectoris: Secondary | ICD-10-CM | POA: Diagnosis not present

## 2018-08-26 DIAGNOSIS — I10 Essential (primary) hypertension: Secondary | ICD-10-CM | POA: Insufficient documentation

## 2018-08-26 DIAGNOSIS — Z8249 Family history of ischemic heart disease and other diseases of the circulatory system: Secondary | ICD-10-CM | POA: Diagnosis not present

## 2018-08-26 DIAGNOSIS — Z86010 Personal history of colonic polyps: Secondary | ICD-10-CM | POA: Diagnosis not present

## 2018-08-26 DIAGNOSIS — Z79899 Other long term (current) drug therapy: Secondary | ICD-10-CM | POA: Insufficient documentation

## 2018-08-26 DIAGNOSIS — K449 Diaphragmatic hernia without obstruction or gangrene: Secondary | ICD-10-CM | POA: Insufficient documentation

## 2018-08-26 DIAGNOSIS — Z820 Family history of epilepsy and other diseases of the nervous system: Secondary | ICD-10-CM | POA: Diagnosis not present

## 2018-08-26 DIAGNOSIS — Z7901 Long term (current) use of anticoagulants: Secondary | ICD-10-CM | POA: Diagnosis not present

## 2018-08-26 DIAGNOSIS — I471 Supraventricular tachycardia: Secondary | ICD-10-CM | POA: Diagnosis not present

## 2018-08-26 DIAGNOSIS — K210 Gastro-esophageal reflux disease with esophagitis: Secondary | ICD-10-CM | POA: Diagnosis not present

## 2018-08-26 DIAGNOSIS — Q399 Congenital malformation of esophagus, unspecified: Secondary | ICD-10-CM | POA: Insufficient documentation

## 2018-08-26 DIAGNOSIS — I480 Paroxysmal atrial fibrillation: Secondary | ICD-10-CM | POA: Insufficient documentation

## 2018-08-26 DIAGNOSIS — Z86711 Personal history of pulmonary embolism: Secondary | ICD-10-CM | POA: Insufficient documentation

## 2018-08-26 DIAGNOSIS — R1310 Dysphagia, unspecified: Secondary | ICD-10-CM | POA: Insufficient documentation

## 2018-08-26 DIAGNOSIS — K589 Irritable bowel syndrome without diarrhea: Secondary | ICD-10-CM | POA: Diagnosis not present

## 2018-08-26 DIAGNOSIS — Z823 Family history of stroke: Secondary | ICD-10-CM | POA: Insufficient documentation

## 2018-08-26 HISTORY — PX: ESOPHAGOGASTRODUODENOSCOPY (EGD) WITH PROPOFOL: SHX5813

## 2018-08-26 SURGERY — ESOPHAGOGASTRODUODENOSCOPY (EGD) WITH PROPOFOL
Anesthesia: General

## 2018-08-26 MED ORDER — PROPOFOL 500 MG/50ML IV EMUL
INTRAVENOUS | Status: AC
  Filled 2018-08-26: qty 50

## 2018-08-26 MED ORDER — PROPOFOL 10 MG/ML IV BOLUS
INTRAVENOUS | Status: DC | PRN
  Administered 2018-08-26: 50 mg via INTRAVENOUS

## 2018-08-26 MED ORDER — SODIUM CHLORIDE 0.9 % IV SOLN
INTRAVENOUS | Status: DC
  Administered 2018-08-26: 1000 mL via INTRAVENOUS

## 2018-08-26 MED ORDER — LIDOCAINE HCL (PF) 2 % IJ SOLN
INTRAMUSCULAR | Status: DC | PRN
  Administered 2018-08-26: 100 mg via INTRADERMAL

## 2018-08-26 MED ORDER — PROPOFOL 500 MG/50ML IV EMUL
INTRAVENOUS | Status: DC | PRN
  Administered 2018-08-26: 125 ug/kg/min via INTRAVENOUS

## 2018-08-26 NOTE — Transfer of Care (Signed)
Immediate Anesthesia Transfer of Care Note  Patient: Jordan Figueroa  Procedure(s) Performed: ESOPHAGOGASTRODUODENOSCOPY (EGD) WITH PROPOFOL (N/A )  Patient Location: PACU  Anesthesia Type:General  Level of Consciousness: sedated  Airway & Oxygen Therapy: Patient Spontanous Breathing and Patient connected to nasal cannula oxygen  Post-op Assessment: Report given to RN and Post -op Vital signs reviewed and stable  Post vital signs: Reviewed and stable  Last Vitals:  Vitals Value Taken Time  BP    Temp    Pulse    Resp    SpO2      Last Pain:  Vitals:   08/26/18 0716  TempSrc: Oral  PainSc: 0-No pain         Complications: No apparent anesthesia complications

## 2018-08-26 NOTE — H&P (Signed)
Jonathon Bellows, MD [address] [place], [place], [place], [address] [address], [place], [place], [place], [address] Phone: [phone]  Fax: [phone]  Primary Care Physician:  Kirk Ruths, MD   Pre-Procedure History & Physical: HPI:  Jordan Figueroa is a 85 y.o. adult is here for an endoscopy    Past Medical History:  Diagnosis Date  . Cancer ([place])    skin  . Colon polyps   . Dyspnea on exertion    a. 03/2015 Echo: Ef 60-65%, Gr1 DD, mild MR, nl RV fxn, mild PAH; b. 06/2015 RHC: minimal elevated PCWP; c.  04/2017 Echo: EF 60-65%, no rwma, GR1 DD, triv AI, mild MR, mildly dil LA, nl RV fxn, nl PASP.  Marland Kitchen GERD (gastroesophageal reflux disease)   . Hemorrhoids   . Hiatal hernia   . IBS (irritable bowel syndrome)   . Non-obstructive CAD (coronary artery disease)    a. 06/2015 Cath: LM 5, LAD 5ost, 30p, 56m, LCX nl, OM1 min irregs, OM2 nl, RCA 20p, 97m, RPDA/RPL1 min irregs, RPAV nl; b. 06/2017 Lexiscan MV: EF 55-65%, low risk.  Marland Kitchen PAF (paroxysmal atrial fibrillation) ([place])    a. Reported - no documentation in our records. Never seen on monitoring.  . Paroxysmal supraventricular tachycardia [facility]) March 2017   First documented during episode of influenza  . Pulmonary embolism ([place])    a. Chronic eliquis.  Marland Kitchen PVC's (premature ventricular contractions)    a. 06/2017 24h Holter: Avg HR 67 (49-110), rare PACs up to 5 beat run, freq PVC's - 8% burden; b. 08/2017 Zio: Avg HR 64 (48-171), occas PACs & PVCs. 4 episodes of NSVT (4 beats longest). 48 episodes of SVT up to 12.4 secs, max rate 171.    Past Surgical History:  Procedure Laterality Date  . CARDIAC CATHETERIZATION N/A 06/28/2015   Procedure: Left Heart Cath and Coronary Angiography;  Surgeon: Leonie Man, MD;  Location: [facility];  Service: Cardiovascular;  Laterality: N/A;; proximal and mid RCA focal 20-25% lesions. Mild diffuse calcifications in the left main and proximal LAD ~30%, LVEDP 18 mmHg  . CARDIAC  CATHETERIZATION  06/28/2015   Procedure: Right Heart Cath;  Surgeon: Leonie Man, MD;  Location: [facility];  Service: Cardiovascular;; essentially normal right heart cath pressures. Cardiac Output 4.74.Normal wedge pressure roughly 12 mmHg.   Marland Kitchen COLON SURGERY  07/14/2013   small bowel obstruction  . SKIN SURGERY    . TRANSTHORACIC ECHOCARDIOGRAM  04/10/2015   EF 60-65%. No RWMA, GR 1 DD. Mild PA pressure elevation of 38 mmHg.    Prior to Admission medications   Medication Sig Start Date End Date Taking? Authorizing Provider  finasteride (PROSCAR) 5 MG tablet Take 1 tablet (5 mg total) by mouth daily. 01/26/18  Yes Stoioff, Ronda Fairly, MD  gabapentin (NEURONTIN) 100 MG capsule Take 100 mg by mouth 3 (three) times daily.   Yes [provider]  metoprolol succinate (TOPROL-XL) 25 MG 24 hr tablet Take 1 tablet (25 mg total) by mouth daily. Take with or immediately following a meal. 08/09/18 11/07/18 Yes Dunn, Areta Haber, PA-C  apixaban (ELIQUIS) 5 MG TABS tablet Take 1 tablet (5 mg total) by mouth 2 (two) times daily. 05/07/15   Leonie Man, MD  furosemide (LASIX) 40 MG tablet Take 1 tablet (40 mg total) by mouth daily. 09/01/17 07/28/18  Evans Lance, MD  isosorbide mononitrate (IMDUR) 30 MG 24 hr tablet Take 0.5 tablets (  15 mg total) by mouth daily. 05/24/18 08/22/18  End, Harrell Gave, MD  nitroGLYCERIN (NITROSTAT) 0.4 MG SL tablet Place 1 tablet (0.4 mg total) under the tongue every 5 (five) minutes as needed for chest pain. Maximum of 3 doses. 04/13/18   Nelva Bush, MD    Allergies as of 08/19/2018  . (No Known Allergies)    Family History  Problem Relation Age of Onset  . CVA Father   . Alzheimer's disease Mother   . Heart Problems Sister   . Heart attack Brother   . Prostate cancer Neg Hx   . Bladder Cancer Neg Hx   . Kidney cancer Neg Hx     Social History   Socioeconomic History  . Marital status: Widowed    Spouse name: Not on file  . Number of children:  Not on file  . Years of education: Not on file  . Highest education level: Not on file  Occupational History  . Not on file  Social Needs  . Financial resource strain: Not on file  . Food insecurity    Worry: Not on file    Inability: Not on file  . Transportation needs    Medical: Not on file    Non-medical: Not on file  Tobacco Use  . Smoking status: Former Smoker    Packs/day: 2.00    Years: 25.00    Pack years: 50.00    Types: Cigarettes    Quit date: 02/25/1973    Years since quitting: 45.5  . Smokeless tobacco: Never Used  Substance and Sexual Activity  . Alcohol use: No    Alcohol/week: 0.0 standard drinks  . Drug use: Never  . Sexual activity: Not on file  Lifestyle  . Physical activity    Days per week: Not on file    Minutes per session: Not on file  . Stress: Not on file  Relationships  . Social Herbalist on phone: Not on file    Gets together: Not on file    Attends religious service: Not on file    Active member of club or organization: Not on file    Attends meetings of clubs or organizations: Not on file    Relationship status: Not on file  . Intimate partner violence    Fear of current or ex partner: Not on file    Emotionally abused: Not on file    Physically abused: Not on file    Forced sexual activity: Not on file  Other Topics Concern  . Not on file  Social History Narrative  . Not on file    Review of Systems: See HPI, otherwise negative ROS  Physical Exam: BP (!) 149/72   Pulse (!) 58   Temp 97.7 F (36.5 C) (Oral)   Resp 18   Ht 5\' 11"  (1.803 m)   Wt 98.9 kg   SpO2 97%   BMI 30.40 kg/m  General:   Alert,  pleasant and cooperative in NAD Head:  Normocephalic and atraumatic. Neck:  Supple; no masses or thyromegaly. Lungs:  Clear throughout to auscultation, normal respiratory effort.    Heart:  +S1, +S2, Regular rate and rhythm, No edema. Abdomen:  Soft, nontender and nondistended. Normal bowel sounds, without  guarding, and without rebound.   Neurologic:  Alert and  oriented x4;  grossly normal neurologically.  Impression/Plan: Jordan Figueroa is here for an endoscopy  to be performed for  evaluation of dysphagia    Risks, benefits, limitations,  and alternatives regarding endoscopy and dilation  have been reviewed with the patient.  Questions have been answered.  All parties agreeable.   Jonathon Bellows, MD  08/26/2018, 8:09 AM

## 2018-08-26 NOTE — Anesthesia Procedure Notes (Signed)
Date/Time: 08/26/2018 8:20 AM Performed by: Nelda Marseille, CRNA Pre-anesthesia Checklist: Patient identified, Emergency Drugs available, Suction available, Patient being monitored and Timeout performed Oxygen Delivery Method: Nasal cannula

## 2018-08-26 NOTE — Op Note (Signed)
[facility] Patient Name: Jordan Figueroa Procedure Date: 08/26/2018 8:12 AM MRN: [id number] Account #: [id number] Date of Birth: 30-Apr-1927 Admit Type: Outpatient Age: 85 Room: [facility] ENDO ROOM 4 Gender: Male Note Status: Finalized Procedure:            Upper GI endoscopy Indications:          Dysphagia Providers:            Jonathon Bellows MD, MD Medicines:            Monitored Anesthesia Care Complications:        No immediate complications. Procedure:            Pre-Anesthesia Assessment:                       - Prior to the procedure, a History and Physical was                        performed, and patient medications, allergies and                        sensitivities were reviewed. The patient's tolerance of                        previous anesthesia was reviewed.                       - The risks and benefits of the procedure and the                        sedation options and risks were discussed with the                        patient. All questions were answered and informed                        consent was obtained.                       - ASA Grade Assessment: III - A patient with severe                        systemic disease.                       After obtaining informed consent, the endoscope was                        passed under direct vision. Throughout the procedure,                        the patient's blood pressure, pulse, and oxygen                        saturations were monitored continuously. The Endoscope                        was introduced through the mouth, and advanced to the                        third part of duodenum. The upper GI endoscopy was  accomplished with ease. The patient tolerated the                        procedure well. Findings:      The examined duodenum was normal.      A large hiatal hernia was present.      Localized moderate inflammation characterized by congestion (edema)  and       erythema was found in the gastric antrum. Biopsies were taken with a       cold forceps for histology.      One benign-appearing, intrinsic mild stenosis was found at the       gastroesophageal junction. This stenosis measured less than one cm (in       length). The stenosis was traversed. A TTS dilator was passed through       the scope. Dilation with a 15-16.5-18 mm balloon dilator was performed       to 18 mm. The dilation site was examined following endoscope reinsertion       and showed no change.      The lower third of the esophagus was significantly tortuous.      The cardia and gastric fundus were normal on retroflexion. Impression:           - Normal examined duodenum.                       - Large hiatal hernia.                       - Gastritis. Biopsied.                       - Benign-appearing esophageal stenosis. Dilated.                       - Tortuous esophagus. Recommendation:       - Discharge patient to home (with escort).                       - Resume previous diet.                       - Continue present medications.                       - Resume Xarelto (rivaroxaban) at prior dose today.                       - Await pathology results.                       - Return to my office in 2 weeks.                       - If dysphagia persists will need esophageal manometry Procedure Code(s):    --- Professional ---                       [phone], Esophagogastroduodenoscopy, flexible, transoral;                        with transendoscopic balloon dilation of esophagus                        (  less than 30 mm diameter)                       43239, 59, Esophagogastroduodenoscopy, flexible,                        transoral; with biopsy, single or multiple Diagnosis Code(s):    --- Professional ---                       K44.9, Diaphragmatic hernia without obstruction or                        gangrene                       K29.70, Gastritis, unspecified, without  bleeding                       K22.2, Esophageal obstruction                       Q39.9, Congenital malformation of esophagus, unspecified                       R13.10, Dysphagia, unspecified CPT copyright 2019 American Medical Association. All rights reserved. The codes documented in this report are preliminary and upon coder review may  be revised to meet current compliance requirements. Jonathon Bellows, MD Jonathon Bellows MD, MD 08/26/2018 8:28:51 AM This report has been signed electronically. Number of Addenda: 0 Note Initiated On: 08/26/2018 8:12 AM Estimated Blood Loss: Estimated blood loss: none.      [facility]

## 2018-08-26 NOTE — Anesthesia Post-op Follow-up Note (Signed)
Anesthesia QCDR form completed.        

## 2018-08-26 NOTE — Anesthesia Preprocedure Evaluation (Signed)
Anesthesia Evaluation  Patient identified by MRN, date of birth, ID band Patient awake    Reviewed: Allergy & Precautions, H&P , NPO status , Patient's Chart, lab work & pertinent test results, reviewed documented beta blocker date and time   Airway Mallampati: II   Neck ROM: full    Dental  (+) Poor Dentition   Pulmonary neg pulmonary ROS, former smoker,    Pulmonary exam normal        Cardiovascular Exercise Tolerance: Poor hypertension, On Medications + angina with exertion + CAD  negative cardio ROS Normal cardiovascular exam Rhythm:regular Rate:Normal     Neuro/Psych negative neurological ROS  negative psych ROS   GI/Hepatic negative GI ROS, Neg liver ROS, hiatal hernia, GERD  Medicated,  Endo/Other  negative endocrine ROS  Renal/GU negative Renal ROS  negative genitourinary   Musculoskeletal   Abdominal   Peds  Hematology negative hematology ROS (+)   Anesthesia Other Findings Past Medical History: No date: Cancer ([place])     Comment:  skin No date: Colon polyps No date: Dyspnea on exertion     Comment:  a. 03/2015 Echo: Ef 60-65%, Gr1 DD, mild MR, nl RV fxn,               mild PAH; b. 06/2015 RHC: minimal elevated PCWP; c.                04/2017 Echo: EF 60-65%, no rwma, GR1 DD, triv AI, mild               MR, mildly dil LA, nl RV fxn, nl PASP. No date: GERD (gastroesophageal reflux disease) No date: Hemorrhoids No date: Hiatal hernia No date: IBS (irritable bowel syndrome) No date: Non-obstructive CAD (coronary artery disease)     Comment:  a. 06/2015 Cath: LM 5, LAD 5ost, 30p, 69m, LCX nl, OM1               min irregs, OM2 nl, RCA 20p, 29m, RPDA/RPL1 min irregs,               RPAV nl; b. 06/2017 Lexiscan MV: EF 55-65%, low risk. No date: PAF (paroxysmal atrial fibrillation) ([facility])     Comment:  a. Reported - no documentation in our records. Never               seen on monitoring. March 2017: Paroxysmal  supraventricular tachycardia ([place])     Comment:  First documented during episode of influenza No date: Pulmonary embolism ([facility])     Comment:  a. Chronic eliquis. No date: PVC's (premature ventricular contractions)     Comment:  a. 06/2017 24h Holter: Avg HR 67 (49-110), rare PACs up               to 5 beat run, freq PVC's - 8% burden; b. 08/2017 Zio: Avg              HR 64 (48-171), occas PACs & PVCs. 4 episodes of NSVT (4               beats longest). 48 episodes of SVT up to 12.4 secs, max               rate 171. Past Surgical History: 06/28/2015: CARDIAC CATHETERIZATION; N/A     Comment:  Procedure: Left Heart Cath and Coronary Angiography;                Surgeon: Leonie Man, MD;  Location: [facility];  Service: Cardiovascular;  Laterality: N/A;;               proximal and mid RCA focal 20-25% lesions. Mild diffuse               calcifications in the left main and proximal LAD ~30%,               LVEDP 18 mmHg 06/28/2015: CARDIAC CATHETERIZATION     Comment:  Procedure: Right Heart Cath;  Surgeon: Leonie Man,               MD;  Location: [facility];  Service:               Cardiovascular;; essentially normal right heart cath               pressures. Cardiac Output 4.74.Normal wedge pressure               roughly 12 mmHg.  07/14/2013: COLON SURGERY     Comment:  small bowel obstruction No date: SKIN SURGERY 04/10/2015: TRANSTHORACIC ECHOCARDIOGRAM     Comment:  EF 60-65%. No RWMA, GR 1 DD. Mild PA pressure elevation               of 38 mmHg. BMI    Body Mass Index: 30.40 kg/m     Reproductive/Obstetrics negative OB ROS                             Anesthesia Physical Anesthesia Plan  ASA: III  Anesthesia Plan: General   Post-op Pain Management:    Induction:   PONV Risk Score and Plan:   Airway Management Planned:   Additional Equipment:   Intra-op Plan:   Post-operative Plan:   Informed Consent: I  have reviewed the patients History and Physical, chart, labs and discussed the procedure including the risks, benefits and alternatives for the proposed anesthesia with the patient or authorized representative who has indicated his/her understanding and acceptance.     Dental Advisory Given  Plan Discussed with: CRNA  Anesthesia Plan Comments:         Anesthesia Quick Evaluation

## 2018-08-29 ENCOUNTER — Encounter: Payer: Self-pay | Admitting: Gastroenterology

## 2018-08-29 LAB — SURGICAL PATHOLOGY

## 2018-08-31 NOTE — Anesthesia Postprocedure Evaluation (Signed)
Anesthesia Post Note  Patient: Jordan Figueroa  Procedure(s) Performed: ESOPHAGOGASTRODUODENOSCOPY (EGD) WITH PROPOFOL (N/A )  Patient location during evaluation: PACU Anesthesia Type: General Level of consciousness: awake and alert Pain management: pain level controlled Vital Signs Assessment: post-procedure vital signs reviewed and stable Respiratory status: spontaneous breathing, nonlabored ventilation, respiratory function stable and patient connected to nasal cannula oxygen Cardiovascular status: blood pressure returned to baseline and stable Postop Assessment: no apparent nausea or vomiting Anesthetic complications: no     Last Vitals:  Vitals:   08/26/18 0840 08/26/18 0850  BP: 114/62 124/65  Pulse: (!) 58 (!) 51  Resp: 15 15  Temp:    SpO2: 94% 96%    Last Pain:  Vitals:   08/26/18 0850  TempSrc:   PainSc: 0-No pain                 Molli Barrows

## 2018-09-09 ENCOUNTER — Other Ambulatory Visit: Payer: Self-pay

## 2018-09-09 ENCOUNTER — Encounter: Payer: Self-pay | Admitting: Internal Medicine

## 2018-09-09 ENCOUNTER — Ambulatory Visit (INDEPENDENT_AMBULATORY_CARE_PROVIDER_SITE_OTHER): Payer: Medicare Other | Admitting: Internal Medicine

## 2018-09-09 VITALS — BP 118/64 | HR 56 | Ht 71.0 in | Wt 218.0 lb

## 2018-09-09 DIAGNOSIS — I5032 Chronic diastolic (congestive) heart failure: Secondary | ICD-10-CM

## 2018-09-09 DIAGNOSIS — I471 Supraventricular tachycardia: Secondary | ICD-10-CM

## 2018-09-09 DIAGNOSIS — I25118 Atherosclerotic heart disease of native coronary artery with other forms of angina pectoris: Secondary | ICD-10-CM | POA: Diagnosis not present

## 2018-09-09 NOTE — Patient Instructions (Signed)
Medication Instructions:  Your physician recommends that you continue on your current medications as directed. Please refer to the Current Medication list given to you today.  If you need a refill on your cardiac medications before your next appointment, please call your pharmacy.   Lab work: - None ordered.  If you have labs (blood work) drawn today and your tests are completely normal, you will receive your results only by: Marland Kitchen MyChart Message (if you have MyChart) OR . A paper copy in the mail If you have any lab test that is abnormal or we need to change your treatment, we will call you to review the results.  Testing/Procedures: - None ordered.   Follow-Up: At [facility], you and your health needs are our priority.  As part of our continuing mission to provide you with exceptional heart care, we have created designated Provider Care Teams.  These Care Teams include your primary Cardiologist (physician) and Advanced Practice Providers (APPs -  Physician Assistants and Nurse Practitioners) who all work together to provide you with the care you need, when you need it. You will need a follow up appointment in 6 months.   Please call our office 2 months in advance to schedule this appointment. (Call in December for late February appointment)  You may see Nelva Bush, MD or one of the following Advanced Practice Providers on your designated Care Team:   Murray Hodgkins, NP [name], PA-C . Marrianne Mood, PA-C

## 2018-09-09 NOTE — Progress Notes (Signed)
Follow-up Outpatient Visit Date: 09/09/2018  Primary Care Provider: Kirk Ruths, MD Jordan Figueroa [id number]  Chief Complaint: Follow-up chest pain  HPI:  Jordan Figueroa is a 85 y.o. year-old adult with history of CAD, PE, PSVT, questionable PAF, hiatal hernia, IBS, and GERD, who presents for follow-up of CAD and PAF.  He was last seen in our office in 07/2018 following hospitalization for chest pain earlier in the month.  It was felt that his symptoms were not typical for coronary insufficiency.  Echo showed preserved LVEF without wall motion abnormality.  At his subsequent follow-up visit with [name], PA, he was doing well without further chest pain.  However, he noted continued dysphasia and was scheduled for GI evaluation the following week.  Large hiatal hernia and distal esophageal stricture were identified.  Distal esophagus was dilated.  Today, Jordan Figueroa reports that he is doing relatively well.  He noted some mild soreness in his chest following esophageal dilation, which has since resolved.  He has not had any further sharp chest pain likely brought into the emergency department last month.  His exertional dyspnea, which has been longstanding, is stable.  He still tries to walk about three quarters of a mile a day.  He has minimal swelling of the left ankle, which is chronic.  He denies orthopnea and PND.  He has experienced occasional brief palpitations lasting a second or two with associated transient lightheadedness.  He has not had any falls.  He remains compliant with his medications, noting that addition of isosorbide mononitrate earlier this year seems to have helped relieve previous chest tightness.  He also remains on apixaban without bleeding.  He has experienced occasional headaches, which resolved promptly with acetaminophen.  --------------------------------------------------------------------------------------------------   Cardiovascular History & Procedures: Cardiovascular Problems:  Non-obstructive CAD  ? Paroxysmal atrial fibrillation  Paroxysmal SVT  Pulmonary embolism  Risk Factors:  Known CAD, age, and male gender  Cath/PCI:  LHC/RHC (06/28/15):Mild diffuse disease involving the left coronary artery. 20-25% proximal and mid/distal RCA lesions. Mildly elevated left heart filling pressure. Normal right heart filling and pulmonary artery pressures. Normal LVEF with mildly reduced Fick cardiac output/index.  CV Surgery:  None  EP Procedures and Devices:  Event monitor (08/24/2017): Predominantly sinus rhythm with occasional PACs and PVCs. 48 episodes of PSVT up to 12.4 seconds.  24-hour Holter monitor (06/22/2017): This rhythm with frequent PVCs (8% burden) and rare PACs. Single brief atrial run noted.  Non-Invasive Evaluation(s):  Transthoracic echocardiogram (07/28/2018): Normal LV size and wall thickness.  LVEF 60-65% with grade 1 diastolic dysfunction.  Normal RV size and function.  No significant valvular abnormality.  Pharmacologic MPI (04/12/2018): Low risk study without ischemia or scar.  LVEF calculated at 40%, likely artifactually low due to GI uptake.  Transthoracic echocardiogram (04/29/2017): Normal LV size and LVEF (60 to 65%) with grade 1 diastolic dysfunction. Trivial AI and mild MR. Mild left atrial enlargement. Normal RV contraction.  Exercise myocardial perfusion stress test (06/07/15): Low risk scan without evidence of ischemia. Apical thinning noted. LVEF reduced at 40%, a likely artifactual.  Transthoracic echocardiogram (04/10/15): Normal LV size and function with LVEF is 60-65%. Grade 1 diastolic dysfunction. Mild mitral regurgitation. Normal RV size and function. Mild pulmonary hypertension.  Recent CV Pertinent Labs: Lab Results  Component Value Date   CHOL 109 07/28/2018   HDL 38 (L) 07/28/2018   LDLCALC 61 07/28/2018   TRIG 48 07/28/2018  CHOLHDL 2.9  07/28/2018   INR 1.23 06/26/2015   BNP 87.0 04/10/2015   K 3.9 08/09/2018   K 4.1 07/17/2013   MG 2.3 08/09/2018   BUN 15 08/09/2018   BUN 9 07/17/2013   CREATININE 0.95 08/09/2018   CREATININE 0.92 07/17/2013    Past medical and surgical history were reviewed and updated in EPIC.  Current Meds  Medication Sig  . apixaban (ELIQUIS) 5 MG TABS tablet Take 1 tablet (5 mg total) by mouth 2 (two) times daily.  . finasteride (PROSCAR) 5 MG tablet Take 1 tablet (5 mg total) by mouth daily.  . furosemide (LASIX) 40 MG tablet Take 1 tablet (40 mg total) by mouth daily.  Marland Kitchen gabapentin (NEURONTIN) 100 MG capsule Take 100 mg by mouth 3 (three) times daily.  . isosorbide mononitrate (IMDUR) 30 MG 24 hr tablet Take 0.5 tablets (15 mg total) by mouth daily.  . metoprolol succinate (TOPROL-XL) 25 MG 24 hr tablet Take 1 tablet (25 mg total) by mouth daily. Take with or immediately following a meal.  . nitroGLYCERIN (NITROSTAT) 0.4 MG SL tablet Place 1 tablet (0.4 mg total) under the tongue every 5 (five) minutes as needed for chest pain. Maximum of 3 doses.    Allergies: Patient has no known allergies.  Social History   Tobacco Use  . Smoking status: Former Smoker    Packs/day: 2.00    Years: 25.00    Pack years: 50.00    Types: Cigarettes    Quit date: 02/25/1973    Years since quitting: 45.5  . Smokeless tobacco: Never Used  Substance Use Topics  . Alcohol use: No    Alcohol/week: 0.0 standard drinks  . Drug use: Never    Family History  Problem Relation Age of Onset  . CVA Father   . Alzheimer's disease Mother   . Heart Problems Sister   . Heart attack Brother   . Prostate cancer Neg Hx   . Bladder Cancer Neg Hx   . Kidney cancer Neg Hx     Review of Systems: A 12-system review of systems was performed and was negative except as noted in the HPI.  --------------------------------------------------------------------------------------------------  Physical Exam: BP 118/64  (BP Location: Left Arm, Patient Position: Sitting, Cuff Size: Normal)   Pulse (!) 56   Ht 5\' 11"  (1.803 m)   Wt 218 lb (98.9 kg)   BMI 30.40 kg/m   General: NAD. HEENT: No conjunctival pallor or scleral icterus.  Facemask in place. Neck: Supple without lymphadenopathy, thyromegaly, JVD, or HJR. Lungs: Normal work of breathing. Clear to auscultation bilaterally without wheezes or crackles. Heart: Bradycardic but regular without murmurs, rubs, or gallops. Non-displaced PMI. Abd: Bowel sounds present. Soft, NT/ND without hepatosplenomegaly Ext: Trace BLE edema. Skin: Warm and dry without rash.  EKG: Sinus bradycardia (heart rate 56 bpm) with first-degree AV block and left axis deviation.  Otherwise, no significant abnormality.  Lab Results  Component Value Date   WBC 7.7 08/09/2018   HGB 15.5 08/09/2018   HCT 45.8 08/09/2018   MCV 88 08/09/2018   PLT 253 08/09/2018    Lab Results  Component Value Date   NA 141 08/09/2018   K 3.9 08/09/2018   CL 102 08/09/2018   CO2 26 08/09/2018   BUN 15 08/09/2018   CREATININE 0.95 08/09/2018   GLUCOSE 146 (H) 08/09/2018   ALT 22 04/10/2015    Lab Results  Component Value Date   CHOL 109 07/28/2018   HDL 38 (  L) 07/28/2018   LDLCALC 61 07/28/2018   TRIG 48 07/28/2018   CHOLHDL 2.9 07/28/2018    --------------------------------------------------------------------------------------------------  ASSESSMENT AND PLAN: Coronary artery disease with stable angina: Mr. Nim is doing well.  Previous chest tightness has resolved with addition of isosorbide mononitrate earlier this summer.  Atypical chest pain that led to hospitalization was most likely GI in nature and has improved following esophageal dilation.  We will continue current medications, including isosorbide mononitrate and metoprolol succinate.  Given age and excellent LDL (61), we will defer addition of a statin.  Paroxysmal supraventricular tachycardia: No palpitations.  No  further work-up at this time.  Continue metoprolol succinate 25 mg daily.  Patient has questionable history of paroxysmal atrial fibrillation as well as prior PE.  Apixaban has been maintained by Dr. Ouida Sills.  I will defer management of anticoagulation to him.  Chronic HFpEF: Chronic dyspnea on exertion is likely multifactorial and partially driven by HFpEF.  Mr. Tenold appears euvolemic and well compensated.  We will continue current medications; no further intervention at this time.  Follow-up: Return to clinic in 6 months.  Nelva Bush, MD 09/09/2018 9:30 AM

## 2018-09-20 ENCOUNTER — Telehealth: Payer: Self-pay | Admitting: Internal Medicine

## 2018-09-20 MED ORDER — METOPROLOL SUCCINATE ER 25 MG PO TB24: 12.5000 mg | ORAL_TABLET | Freq: Every day | ORAL | 3 refills | Status: DC

## 2018-09-20 NOTE — Telephone Encounter (Signed)
Called patient and he verbalized understanding to decrease metoprolol to 12.5 mg daily. He will let us know how he feels.

## 2018-09-20 NOTE — Telephone Encounter (Signed)
Given worsening of symptoms, I think it is reasonable for him to decrease metoprolol succinate to 12.5 mg daily and let us know if he feels any better.  Nelva Bush, MD [facility] Pager: [phone]

## 2018-09-20 NOTE — Telephone Encounter (Signed)
Called patient. Patient says he has the "blahs."  He states he feels "washed out." Started feeling that way after increasing metoprolol to 25 mg daily around 08/09/18 office visit.  He is still walking but not as much as he was before.  He also reports headache that has increased in frequency over the past 1-2 weeks.  Now he has a headache everyday. Tylenol resolves it but he still feels lack of energy and  "blah." Says he loves to read but now cannot keep his eyes open to do so. As this is with any activity where he is sitting still.  Most recent BP was 118/64, HR 56. Advised I will route to Dr End for review and further advice.

## 2018-09-20 NOTE — Telephone Encounter (Signed)
Patient c/o frequent headaches he has been managing with tylenol but they make him feel washed out.  Patient also c/o of falling asleep whenever he is still like when reading.    Patient wants to know if he should see Dr. Saunders Revel about this.

## 2018-10-02 ENCOUNTER — Encounter: Payer: Self-pay | Admitting: Gastroenterology

## 2018-10-06 ENCOUNTER — Encounter: Payer: Self-pay | Admitting: Gastroenterology

## 2018-10-06 ENCOUNTER — Ambulatory Visit (INDEPENDENT_AMBULATORY_CARE_PROVIDER_SITE_OTHER): Payer: Medicare Other | Admitting: Gastroenterology

## 2018-10-06 ENCOUNTER — Other Ambulatory Visit: Payer: Self-pay

## 2018-10-06 VITALS — BP 118/66 | HR 60 | Temp 98.3°F | Ht 71.0 in | Wt 219.8 lb

## 2018-10-06 DIAGNOSIS — I200 Unstable angina: Secondary | ICD-10-CM | POA: Diagnosis not present

## 2018-10-06 DIAGNOSIS — R1310 Dysphagia, unspecified: Secondary | ICD-10-CM | POA: Diagnosis not present

## 2018-10-06 MED ORDER — OMEPRAZOLE 20 MG PO CPDR: 20.0000 mg | DELAYED_RELEASE_CAPSULE | Freq: Every day | ORAL | 1 refills | Status: DC

## 2018-10-06 NOTE — Progress Notes (Signed)
Jonathon Bellows MD, MRCP(U.K) [address]  [place]  [place], [place] [address]  Main: [phone]  Fax: [phone]   Primary Care Physician: Kirk Ruths, MD  Primary Gastroenterologist:  Dr. Jonathon Bellows   Follow-up for dysphagia  HPI: Arch Plant is a 85 y.o. adult   Summary of history :  He was initially referred and seen on 08/18/2018 for dysphagia.Marland Kitchen  He was recently admitted to the hospital discharge on 07/29/2018 for unstable angina and shortness of breath.  He is on Eliquis.  History of paroxysmal atrial fibrillation and paroxysmal supraventricular tachycardia.  At some point there was some concern for dysphagia and hence was referred to see [place] as an outpatient.  Seen by cardiology on 08/09/2018.  He states that he has had issues with swallowing for very long.  Of time but recently has got worse.  Mostly affects solids.  He has had times when the food got stuck and he had to pick up to get it back up.  Denies any weight loss.  Interval history 08/18/2018-10/06/2018  08/26/2018: EGD: Medium size hiatal hernia noted.  Esophagus was tortuous.  Empiric dilation of the GE junction was performed to 18 mm with a balloon.  Biopsies of the esophagus demonstrated features of reflux esophagitis.  Biopsies of stomach showed nonspecific gastritis Since his dilation he has been doing well no issues with dysphagia.  He is not taking a PPI takes Tums occasionally.  Current Outpatient Medications  Medication Sig Dispense Refill  . apixaban (ELIQUIS) 5 MG TABS tablet Take 1 tablet (5 mg total) by mouth 2 (two) times daily. 90 tablet 3  . finasteride (PROSCAR) 5 MG tablet Take 1 tablet (5 mg total) by mouth daily. 90 tablet 3  . furosemide (LASIX) 40 MG tablet Take 1 tablet (40 mg total) by mouth daily. 90 tablet 3  . gabapentin (NEURONTIN) 100 MG capsule Take 100 mg by mouth 3 (three) times daily.    . isosorbide mononitrate (IMDUR) 30 MG 24 hr tablet Take 0.5 tablets (15 mg total) by  mouth daily. 45 tablet 0  . metoprolol succinate (TOPROL-XL) 25 MG 24 hr tablet Take 0.5 tablets (12.5 mg total) by mouth daily. Take with or immediately following a meal. 90 tablet 3  . nitroGLYCERIN (NITROSTAT) 0.4 MG SL tablet Place 1 tablet (0.4 mg total) under the tongue every 5 (five) minutes as needed for chest pain. Maximum of 3 doses. 25 tablet 3   No current facility-administered medications for this visit.     Allergies as of 10/06/2018  . (No Known Allergies)    ROS:  General: Negative for anorexia, weight loss, fever, chills, fatigue, weakness. ENT: Negative for hoarseness, difficulty swallowing , nasal congestion. CV: Negative for chest pain, angina, palpitations, dyspnea on exertion, peripheral edema.  Respiratory: Negative for dyspnea at rest, dyspnea on exertion, cough, sputum, wheezing.  GI: See history of present illness. GU:  Negative for dysuria, hematuria, urinary incontinence, urinary frequency, nocturnal urination.  Endo: Negative for unusual weight change.    Physical Examination:   There were no vitals taken for this visit.  General: Well-nourished, well-developed in no acute distress.  Eyes: No icterus. Conjunctivae pink. Mouth: Oropharyngeal mucosa moist and pink , no lesions erythema or exudate. Lungs: Clear to auscultation bilaterally. Non-labored. Heart: Regular rate and rhythm, no murmurs rubs or gallops.  Abdomen: Bowel sounds are normal, nontender, nondistended, no hepatosplenomegaly or masses, no abdominal bruits or hernia , no rebound or guarding.  Extremities: No lower extremity edema. No clubbing or deformities. Neuro: Alert and oriented x 3.  Grossly intact. Skin: Warm and dry, no jaundice.   Psych: Alert and cooperative, normal mood and affect.   Imaging Studies: No results found.  Assessment and Plan:   Taiton Arciero is a 85 y.o. y/o adult with a history of dysphagia underwent an EGD on 08/26/2018.  Large hiatal hernia noted empiric  dilation was performed of the GE junction to 18 mm.  Biopsies esophagus demonstrated features of reflux esophagitis.  He has not been on a PPI previously.  In view of his hiatal hernia I do not feel he is a reflux esophagitis being cured spontaneously without any medications.  Hence I would suggest we cover him with low-dose PPI such as Prilosec 20 mg a day.  Plan 1.  PPI Prilosec 20 mg a day to treat acid reflux.  If he is doing well with this in 2 to 3 months can consider to change to Pepcid daily once at night. 2.  If dysphagia recurs then may need to evaluate for achalasia as he had a tortuous esophagus    Dr Jonathon Bellows  MD,MRCP [facility]) Follow up in as needed

## 2018-10-10 ENCOUNTER — Other Ambulatory Visit: Payer: Self-pay | Admitting: *Deleted

## 2018-10-10 MED ORDER — FUROSEMIDE 40 MG PO TABS: 40.0000 mg | ORAL_TABLET | Freq: Every day | ORAL | 0 refills | Status: DC

## 2018-10-11 ENCOUNTER — Other Ambulatory Visit: Payer: Self-pay | Admitting: Internal Medicine

## 2018-12-26 ENCOUNTER — Telehealth: Payer: Self-pay | Admitting: Urology

## 2018-12-26 ENCOUNTER — Encounter: Payer: Self-pay | Admitting: Urology

## 2018-12-26 ENCOUNTER — Ambulatory Visit (INDEPENDENT_AMBULATORY_CARE_PROVIDER_SITE_OTHER): Payer: Medicare Other | Admitting: Urology

## 2018-12-26 ENCOUNTER — Other Ambulatory Visit: Payer: Self-pay

## 2018-12-26 VITALS — BP 150/85 | HR 81 | Ht 71.0 in | Wt 218.0 lb

## 2018-12-26 DIAGNOSIS — N138 Other obstructive and reflux uropathy: Secondary | ICD-10-CM | POA: Diagnosis not present

## 2018-12-26 DIAGNOSIS — N401 Enlarged prostate with lower urinary tract symptoms: Secondary | ICD-10-CM

## 2018-12-26 MED ORDER — FINASTERIDE 5 MG PO TABS: 5.0000 mg | ORAL_TABLET | Freq: Every day | ORAL | 3 refills | Status: DC

## 2018-12-26 NOTE — Progress Notes (Signed)
12/26/2018 11:15 AM   Jordan Figueroa 1927/05/21 [id number]  Referring provider: Kirk Ruths, MD [facility] [place],  [place] [address]  Chief Complaint  Patient presents with  . Benign Prostatic Hypertrophy    Urologic history: 1.  BPH with lower urinary tract symptoms -Finasteride 5 mg daily  2.  Elevated PSA -Biopsy 2006 PSA 6.2 with benign pathology -Discontinued PSA testing 2015 (uncorrected PSA 1.5)   HPI: Jordan Figueroa presents for annual follow-up.  He states he has done quite well the last year and has no bothersome lower urinary tract symptoms.  He remains on finasteride.  Denies dysuria, gross hematuria or flank/abdominal/pelvic pain.   PMH: Past Medical History:  Diagnosis Date  . Cancer ([place])    skin  . Colon polyps   . Dyspnea on exertion    a. 03/2015 Echo: Ef 60-65%, Gr1 DD, mild MR, nl RV fxn, mild PAH; b. 06/2015 RHC: minimal elevated PCWP; c.  04/2017 Echo: EF 60-65%, no rwma, GR1 DD, triv AI, mild MR, mildly dil LA, nl RV fxn, nl PASP.  Marland Kitchen GERD (gastroesophageal reflux disease)   . Hemorrhoids   . Hiatal hernia   . IBS (irritable bowel syndrome)   . Non-obstructive CAD (coronary artery disease)    a. 06/2015 Cath: LM 5, LAD 5ost, 30p, 8m, LCX nl, OM1 min irregs, OM2 nl, RCA 20p, 56m, RPDA/RPL1 min irregs, RPAV nl; b. 06/2017 Lexiscan MV: EF 55-65%, low risk.  Marland Kitchen PAF (paroxysmal atrial fibrillation) ([place])    a. Reported - no documentation in our records. Never seen on monitoring.  . Paroxysmal supraventricular tachycardia [facility]) March 2017   First documented during episode of influenza  . Pulmonary embolism ([place])    a. Chronic eliquis.  Marland Kitchen PVC's (premature ventricular contractions)    a. 06/2017 24h Holter: Avg HR 67 (49-110), rare PACs up to 5 beat run, freq PVC's - 8% burden; b. 08/2017 Zio: Avg HR 64 (48-171), occas PACs & PVCs. 4 episodes of NSVT (4 beats longest). 48 episodes of SVT up to 12.4 secs, max rate 171.     Surgical History: Past Surgical History:  Procedure Laterality Date  . CARDIAC CATHETERIZATION N/A 06/28/2015   Procedure: Left Heart Cath and Coronary Angiography;  Surgeon: Leonie Man, MD;  Location: [facility];  Service: Cardiovascular;  Laterality: N/A;; proximal and mid RCA focal 20-25% lesions. Mild diffuse calcifications in the left main and proximal LAD ~30%, LVEDP 18 mmHg  . CARDIAC CATHETERIZATION  06/28/2015   Procedure: Right Heart Cath;  Surgeon: Leonie Man, MD;  Location: [place] CV [facility];  Service: Cardiovascular;; essentially normal right heart cath pressures. Cardiac Output 4.74.Normal wedge pressure roughly 12 mmHg.   Marland Kitchen COLON SURGERY  07/14/2013   small bowel obstruction  . ESOPHAGOGASTRODUODENOSCOPY (EGD) WITH PROPOFOL N/A 08/26/2018   Procedure: ESOPHAGOGASTRODUODENOSCOPY (EGD) WITH PROPOFOL;  Surgeon: Jonathon Bellows, MD;  Location: [facility] END[facility];  Service: Gastroenterology;  Laterality: N/A;  . SKIN SURGERY    . TRANSTHORACIC ECHOCARDIOGRAM  04/10/2015   EF 60-65%. No RWMA, GR 1 DD. Mild PA pressure elevation of 38 mmHg.    Home Medications:  Allergies as of 12/26/2018   No Known Allergies     Medication List       Accurate as of December 26, 2018 11:15 AM. If you have any questions, ask your nurse or doctor.        apixaban 5 MG Tabs tablet Commonly known  as: Eliquis Take 1 tablet (5 mg total) by mouth 2 (two) times daily.   finasteride 5 MG tablet Commonly known as: PROSCAR Take 1 tablet (5 mg total) by mouth daily.   furosemide 40 MG tablet Commonly known as: LASIX Take 1 tablet (40 mg total) by mouth daily.   gabapentin 100 MG capsule Commonly known as: NEURONTIN Take 100 mg by mouth 3 (three) times daily.   isosorbide mononitrate 30 MG 24 hr tablet Commonly known as: IMDUR TAKE 0.5 TABLETS (15 MG TOTAL) BY MOUTH DAILY.   metoprolol succinate 25 MG 24 hr tablet Commonly known as: TOPROL-XL Take 0.5 tablets (12.5 mg total) by  mouth daily. Take with or immediately following a meal.   nitroGLYCERIN 0.4 MG SL tablet Commonly known as: Nitrostat Place 1 tablet (0.4 mg total) under the tongue every 5 (five) minutes as needed for chest pain. Maximum of 3 doses.   omeprazole 20 MG capsule Commonly known as: PRILOSEC Take 1 capsule (20 mg total) by mouth daily.       Allergies: No Known Allergies  Family History: Family History  Problem Relation Age of Onset  . CVA Father   . Alzheimer's disease Mother   . Heart Problems Sister   . Heart attack Brother   . Prostate cancer Neg Hx   . Bladder Cancer Neg Hx   . Kidney cancer Neg Hx     Social History:  reports that he quit smoking about 45 years ago. His smoking use included cigarettes. He has a 50.00 pack-year smoking history. He has never used smokeless tobacco. He reports that he does not drink alcohol or use drugs.  ROS: UROLOGY Frequent Urination?: Yes Hard to postpone urination?: No Burning/pain with urination?: No Get up at night to urinate?: Yes Leakage of urine?: Yes Urine stream starts and stops?: No Trouble starting stream?: No Do you have to strain to urinate?: No Blood in urine?: No Urinary tract infection?: No Sexually transmitted disease?: No Injury to kidneys or bladder?: No Painful intercourse?: No Weak stream?: No Erection problems?: No Penile pain?: No Currently pregnant?: No Vaginal bleeding?: No Last menstrual period?: N/A  Gastrointestinal Nausea?: No Vomiting?: No Indigestion/heartburn?: No Diarrhea?: No Constipation?: No  Constitutional Fever: No Night sweats?: No Weight loss?: No Fatigue?: No  Skin Skin rash/lesions?: No Itching?: No  Eyes Blurred vision?: No Double vision?: No  Ears/Nose/Throat Sore throat?: No Sinus problems?: No  Hematologic/Lymphatic Swollen glands?: No Easy bruising?: Yes  Cardiovascular Leg swelling?: No Chest pain?: No  Respiratory Cough?: No Shortness of breath?:  No  Endocrine Excessive thirst?: No  Musculoskeletal Back pain?: No Joint pain?: No  Neurological Headaches?: No Dizziness?: No  Psychologic Depression?: No Anxiety?: No  Physical Exam: BP (!) 150/85 (BP Location: Left Arm, Patient Position: Sitting, Cuff Size: Normal)   Pulse 81   Ht 5\' 11"  (1.803 m)   Wt 218 lb (98.9 kg)   BMI 30.40 kg/m   Constitutional:  Alert, No acute distress. HEENT: [place] AT, moist mucus membranes.  Trachea midline, no masses. Cardiovascular: No clubbing, cyanosis, or edema. Respiratory: Normal respiratory effort, no increased work of breathing. Skin: No rashes, bruises or suspicious lesions. Neurologic: Grossly intact, no focal deficits, moving all 4 extremities. Psychiatric: Normal mood and affect.   Assessment & Plan:    1. BPH with obstruction/lower urinary tract symptoms Stable lower urinary tract symptoms on finasteride which was refilled.  Continue annual follow-up.    [name], [facility]  [address], [place], [place] [address] [phone]

## 2019-01-01 ENCOUNTER — Other Ambulatory Visit: Payer: Self-pay | Admitting: Internal Medicine

## 2019-01-31 ENCOUNTER — Ambulatory Visit: Payer: Self-pay | Admitting: Surgery

## 2019-01-31 NOTE — H&P (Signed)
Subjective:   CC: Pyogenic granuloma of skin and subcutaneous tissue [L98.0]  HPI:  Jordan Figueroa is a 86 y.o. male who was referred by Lurlean Leyden, * for evaluation of above. First noted a few weeks ago.  Symptoms include: asymptomatic.  Exacerbated by nothing.  Alleviated by nothing.  Associated with nothing.    Episodes of bleeding in the past.   Past Medical History:  has a past medical history of 2-vessel coronary artery disease (12/09/2015), A-fib (CMS-HCC) (04/16/2015), Adenomatous colon polyp, unspecified, Adenomatous colon polyp, unspecified, Adenomatous colon polyp, unspecified, BPH (benign prostatic hyperplasia), Chronic pulmonary embolism (CMS-HCC) (04/16/2015), Diverticulosis (07/04/2013), Diverticulosis (07/04/2013), Elevated PSA, Gastritis, H. pylori infection, Hemorrhoids, Melanoma (CMS-HCC), Personal history of malignant melanoma of skin (11/03/2012), and Primary osteoarthritis of right hip (07/30/2016).  Past Surgical History:  has a past surgical history that includes cardiac catheterization  (2004 ); Cataract extraction; Hemorrhoidectomy External; and Colonoscopy.  Family History: family history includes Coronary Artery Disease (Blocked arteries around heart) in his brother and father; Myocardial Infarction (Heart attack) in his brother; Stroke in his father.  Social History:  reports that he has quit smoking. He has never used smokeless tobacco. No history on file for alcohol and drug.  Current Medications: has a current medication list which includes the following prescription(s): eliquis, finasteride, furosemide, gabapentin, isosorbide mononitrate, metoprolol succinate, nitroglycerin, and omeprazole.  Allergies:  No Known Allergies  ROS:  A 15 point review of systems was performed and pertinent positives and negatives noted in HPI   Objective:   Ht 180.3 cm (5\' 11" )   Wt (!) 101.6 kg (224 lb)   BMI 31.24 kg/m   Constitutional :  alert, appears  stated age, cooperative and no distress  Lymphatics/Throat:  no asymmetry, masses, or scars  Respiratory:  clear to auscultation bilaterally  Cardiovascular:  regular rate and rhythm  Gastrointestinal: soft, non-tender; bowel sounds normal; no masses,  no organomegaly.    Musculoskeletal: Steady gait and movement  Skin: Cool and moist, 3.5cm raised, red nodule, with some ulceration at apex secondary to previous attempt at aspiration by patient, no active bleeding today. Mobile and soft. LEFT forearm  Psychiatric: Normal affect, non-agitated, not confused       LABS:  N/a   RADS: n/a  Assessment:      Pyogenic granuloma of skin and subcutaneous tissue [L98.0] LEFT forearm  Plan:   1. Pyogenic granuloma of skin and subcutaneous tissue [L98.0] Discussed surgical excision.  Alternatives include continued observation.  Benefits include possible symptom relief, pathologic evaluation, improved cosmesis. Discussed the risk of surgery including recurrence, chronic pain, post-op infxn, poor cosmesis, poor/delayed wound healing, and possible re-operation to address said risks. The risks of general anesthetic, if used, includes MI, CVA, sudden death or even reaction to anesthetic medications also discussed.  Typical post-op recovery time of 3-5 days with possible activity restrictions were also discussed.  The patient verbalized understanding and all questions were answered to the patient's satisfaction.  2. Patient has elected to proceed with excisional biopsy. Procedure will be scheduled.  Written consent was obtained.  Hold eliquiss prior to procedure.

## 2019-02-04 ENCOUNTER — Ambulatory Visit: Payer: Medicare Other | Attending: Internal Medicine

## 2019-02-04 DIAGNOSIS — Z23 Encounter for immunization: Secondary | ICD-10-CM | POA: Insufficient documentation

## 2019-02-04 NOTE — Progress Notes (Signed)
   Covid-19 Vaccination Clinic  Name:  Jordan Figueroa    MRN: [id number] DOB: [birth year]  02/04/2019  [name] was observed post Covid-19 immunization for 15 minutes without incidence. He was provided with Vaccine Information Sheet and instruction to access the V-Safe system.   [name] was instructed to call 911 with any severe reactions post vaccine: Marland Kitchen Difficulty breathing  . Swelling of your face and throat  . A fast heartbeat  . A bad rash all over your body  . Dizziness and weakness    Immunizations Administered    Name Date Dose VIS Date Route   Pfizer COVID-19 Vaccine 02/04/2019  2:19 PM 0.3 mL 12/23/2018 Intramuscular   Manufacturer: [place]   Lot: [id number]   [place]: [id number]

## 2019-02-06 ENCOUNTER — Ambulatory Visit: Payer: Self-pay | Admitting: Surgery

## 2019-02-08 ENCOUNTER — Encounter: Payer: Self-pay | Admitting: Surgery

## 2019-02-08 ENCOUNTER — Other Ambulatory Visit: Payer: Self-pay

## 2019-02-09 ENCOUNTER — Encounter: Payer: Self-pay | Admitting: Anesthesiology

## 2019-02-10 NOTE — Discharge Instructions (Signed)
General Anesthesia, Adult, Care After This sheet gives you information about how to care for yourself after your procedure. Your health care provider may also give you more specific instructions. If you have problems or questions, contact your health care provider. What can I expect after the procedure? After the procedure, the following side effects are common:  Pain or discomfort at the IV site.  Nausea.  Vomiting.  Sore throat.  Trouble concentrating.  Feeling cold or chills.  Weak or tired.  Sleepiness and fatigue.  Soreness and body aches. These side effects can affect parts of the body that were not involved in surgery. Follow these instructions at home:  For at least 24 hours after the procedure:  Have a responsible adult stay with you. It is important to have someone help care for you until you are awake and alert.  Rest as needed.  Do not: ? Participate in activities in which you could fall or become injured. ? Drive. ? Use heavy machinery. ? Drink alcohol. ? Take sleeping pills or medicines that cause drowsiness. ? Make important decisions or sign legal documents. ? Take care of children on your own. Eating and drinking  Follow any instructions from your health care provider about eating or drinking restrictions.  When you feel hungry, start by eating small amounts of foods that are soft and easy to digest (bland), such as toast. Gradually return to your regular diet.  Drink enough fluid to keep your urine pale yellow.  If you vomit, rehydrate by drinking water, juice, or clear broth. General instructions  If you have sleep apnea, surgery and certain medicines can increase your risk for breathing problems. Follow instructions from your health care provider about wearing your sleep device: ? Anytime you are sleeping, including during daytime naps. ? While taking prescription pain medicines, sleeping medicines, or medicines that make you drowsy.  Return to  your normal activities as told by your health care provider. Ask your health care provider what activities are safe for you.  Take over-the-counter and prescription medicines only as told by your health care provider.  If you smoke, do not smoke without supervision.  Keep all follow-up visits as told by your health care provider. This is important. Contact a health care provider if:  You have nausea or vomiting that does not get better with medicine.  You cannot eat or drink without vomiting.  You have pain that does not get better with medicine.  You are unable to pass urine.  You develop a skin rash.  You have a fever.  You have redness around your IV site that gets worse. Get help right away if:  You have difficulty breathing.  You have chest pain.  You have blood in your urine or stool, or you vomit blood. Summary  After the procedure, it is common to have a sore throat or nausea. It is also common to feel tired.  Have a responsible adult stay with you for the first 24 hours after general anesthesia. It is important to have someone help care for you until you are awake and alert.  When you feel hungry, start by eating small amounts of foods that are soft and easy to digest (bland), such as toast. Gradually return to your regular diet.  Drink enough fluid to keep your urine pale yellow.  Return to your normal activities as told by your health care provider. Ask your health care provider what activities are safe for you. This information is not   intended to replace advice given to you by your health care provider. Make sure you discuss any questions you have with your health care provider. Document Revised: 01/01/2017 Document Reviewed: 08/14/2016 Elsevier Patient Education  2020 Elsevier Inc.  

## 2019-02-14 ENCOUNTER — Other Ambulatory Visit
Admission: RE | Admit: 2019-02-14 | Discharge: 2019-02-14 | Disposition: A | Discharge status: Home / Self Care | Payer: Medicare Other | Source: Ambulatory Visit | Attending: Surgery | Admitting: Surgery

## 2019-02-14 ENCOUNTER — Other Ambulatory Visit: Payer: Self-pay

## 2019-02-14 DIAGNOSIS — U071 COVID-19: Secondary | ICD-10-CM | POA: Diagnosis not present

## 2019-02-14 DIAGNOSIS — Z01812 Encounter for preprocedural laboratory examination: Secondary | ICD-10-CM | POA: Diagnosis present

## 2019-02-14 LAB — SARS CORONAVIRUS 2 (TAT 6-24 HRS): SARS Coronavirus 2: POSITIVE — AB

## 2019-02-15 ENCOUNTER — Other Ambulatory Visit: Payer: Self-pay | Admitting: Internal Medicine

## 2019-02-15 ENCOUNTER — Ambulatory Visit (HOSPITAL_COMMUNITY)
Admission: RE | Admit: 2019-02-15 | Discharge: 2019-02-15 | Disposition: A | Discharge status: Home / Self Care | Payer: Medicare Other | Source: Ambulatory Visit | Attending: Pulmonary Disease | Admitting: Pulmonary Disease

## 2019-02-15 DIAGNOSIS — U071 COVID-19: Secondary | ICD-10-CM | POA: Diagnosis present

## 2019-02-15 DIAGNOSIS — Z23 Encounter for immunization: Secondary | ICD-10-CM | POA: Insufficient documentation

## 2019-02-15 MED ORDER — SODIUM CHLORIDE 0.9 % IV SOLN
700.0000 mg | Freq: Once | INTRAVENOUS | Status: AC
  Administered 2019-02-15: 15:00:00 700 mg via INTRAVENOUS
  Filled 2019-02-15: qty 20

## 2019-02-15 MED ORDER — ALBUTEROL SULFATE HFA 108 (90 BASE) MCG/ACT IN AERS: 2.0000 | INHALATION_SPRAY | Freq: Once | RESPIRATORY_TRACT | Status: DC | PRN

## 2019-02-15 MED ORDER — METHYLPREDNISOLONE SODIUM SUCC 125 MG IJ SOLR: 125.0000 mg | Freq: Once | INTRAMUSCULAR | Status: DC | PRN

## 2019-02-15 MED ORDER — SODIUM CHLORIDE 0.9 % IV SOLN: INTRAVENOUS | Status: DC | PRN

## 2019-02-15 MED ORDER — FAMOTIDINE IN NACL 20-0.9 MG/50ML-% IV SOLN: 20.0000 mg | Freq: Once | INTRAVENOUS | Status: DC | PRN

## 2019-02-15 MED ORDER — EPINEPHRINE 0.3 MG/0.3ML IJ SOAJ: 0.3000 mg | Freq: Once | INTRAMUSCULAR | Status: DC | PRN

## 2019-02-15 MED ORDER — DIPHENHYDRAMINE HCL 50 MG/ML IJ SOLN: 50.0000 mg | Freq: Once | INTRAMUSCULAR | Status: DC | PRN

## 2019-02-15 NOTE — Progress Notes (Unsigned)
  I connected by phone with [name] on 02/15/2019 at 8:55 AM to discuss the potential use of an new treatment for mild to moderate COVID-19 viral infection in non-hospitalized patients.  This patient is a 86 y.o. adult that meets the FDA criteria for Emergency Use Authorization of bamlanivimab or casirivimab\imdevimab.  Has a (+) direct SARS-CoV-2 viral test result  Has mild or moderate COVID-19   Is ? 86 years of age and weighs ? 40 kg  Is NOT hospitalized due to COVID-19  Is NOT requiring oxygen therapy or requiring an increase in baseline oxygen flow rate due to COVID-19  Is within 10 days of symptom onset  Has at least one of the high risk factor(s) for progression to severe COVID-19 and/or hospitalization as defined in EUA.  Specific high risk criteria : >/= 84 yo   I have spoken and communicated the following to the patient or parent/caregiver:  1. FDA has authorized the emergency use of bamlanivimab and casirivimab\imdevimab for the treatment of mild to moderate COVID-19 in adults and pediatric patients with positive results of direct SARS-CoV-2 viral testing who are 58 years of age and older weighing at least 40 kg, and who are at high risk for progressing to severe COVID-19 and/or hospitalization.  2. The significant known and potential risks and benefits of bamlanivimab and casirivimab\imdevimab, and the extent to which such potential risks and benefits are unknown.  3. Information on available alternative treatments and the risks and benefits of those alternatives, including clinical trials.  4. Patients treated with bamlanivimab and casirivimab\imdevimab should continue to self-isolate and use infection control measures (e.g., wear mask, isolate, social distance, avoid sharing personal items, clean and disinfect "high touch" surfaces, and frequent handwashing) according to CDC guidelines.   5. The patient or parent/caregiver has the option to accept or refuse  bamlanivimab or casirivimab\imdevimab .  After reviewing this information with the patient, The patient agreed to proceed with receiving the bamlanimivab infusion and will be provided a copy of the Fact sheet prior to receiving the infusion.Alan Ripper, NP-C [facility] [place]  pgr [phone]

## 2019-02-15 NOTE — Progress Notes (Signed)
Patient ID: Jordan Figueroa, adult   DOB: [birth year], 86 y.o.   MRN: [id number]    Diagnosis: COVID-19  Physician: Dr. Joya Gaskins   Procedure: [facility] Med: bamlanivimab infusion - Provided patient with bamlanimivab fact sheet for patients, parents and caregivers prior to infusion.  Complications: No immediate complications noted.  Discharge: Discharged home   Jordan Figueroa 02/15/2019

## 2019-02-15 NOTE — Discharge Instructions (Signed)

## 2019-02-16 ENCOUNTER — Ambulatory Visit: Admission: RE | Admit: 2019-02-16 | Payer: Medicare Other | Source: Home / Self Care | Admitting: Surgery

## 2019-02-16 HISTORY — DX: Unspecified hearing loss, unspecified ear: H91.90

## 2019-02-16 HISTORY — DX: Unspecified osteoarthritis, unspecified site: M19.90

## 2019-02-16 SURGERY — EXCISION MASS
Anesthesia: General | Site: Arm Lower | Laterality: Left

## 2019-02-23 NOTE — Telephone Encounter (Signed)
ERROR

## 2019-02-25 ENCOUNTER — Ambulatory Visit: Payer: Medicare Other

## 2019-03-01 ENCOUNTER — Other Ambulatory Visit: Admission: RE | Admit: 2019-03-01 | Payer: Medicare Other | Source: Ambulatory Visit

## 2019-03-01 ENCOUNTER — Other Ambulatory Visit: Payer: Self-pay

## 2019-03-01 ENCOUNTER — Encounter
Admission: RE | Admit: 2019-03-01 | Discharge: 2019-03-01 | Disposition: A | Discharge status: Home / Self Care | Payer: Medicare Other | Source: Ambulatory Visit | Attending: Surgery | Admitting: Surgery

## 2019-03-01 DIAGNOSIS — Z01812 Encounter for preprocedural laboratory examination: Secondary | ICD-10-CM | POA: Insufficient documentation

## 2019-03-01 HISTORY — DX: Essential (primary) hypertension: I10

## 2019-03-01 NOTE — Patient Instructions (Signed)
Your procedure is scheduled on: Friday 03/03/19.  Report to DAY SURGERY DEPARTMENT LOCATED ON 2ND FLOOR MEDICAL MALL ENTRANCE. To find out your arrival time please call [phone] between 1PM - 3PM on Thursday 03/02/19.   Remember: Instructions that are not followed completely may result in serious medical risk, up to and including death, or upon the discretion of your surgeon and anesthesiologist your surgery may need to be rescheduled.      _X__ 1. Do not eat food after midnight the night before your procedure.                 No gum chewing or hard candies. You may drink clear liquids up to 2 hours                 before you are scheduled to arrive for your surgery- DO NOT drink clear                 liquids within 2 hours of the start of your surgery.                 Clear Liquids include:  water, apple juice without pulp, clear carbohydrate                 drink such as Clearfast or Gatorade, Black Coffee or Tea (Do not add                 anything to coffee or tea).    __X__2.  On the morning of surgery brush your teeth with toothpaste and water, you may rinse your mouth with mouthwash if you wish.  Do not swallow any toothpaste or mouthwash.    __X__ 3.  Notify your doctor if there is any change in your medical condition      (cold, fever, infections).       Do not wear jewelry, make-up, hairpins, clips or nail polish. Do not wear lotions, powders, or perfumes.  Do not shave 48 hours prior to surgery. Men may shave face and neck. Do not bring valuables to the hospital.     [facility] is not responsible for any belongings or valuables.    Contacts, dentures/partials or body piercings may not be worn into surgery. Bring a case for your contacts, glasses or hearing aids, a denture cup will be supplied.     Patients discharged the day of surgery will not be allowed to drive home.     __X__ Take these medicines the morning of surgery with A SIP OF WATER:     1.  gabapentin (NEURONTIN)  2. isosorbide mononitrate (IMDUR)      __X__ Stop Blood Thinners: Eliquis. You reported stopping this medication as of 02/28/19.    __X__ Stop Anti-inflammatories 7 days before surgery such as Advil, Ibuprofen, Motrin, BC or Goodies Powder, Naprosyn, Naproxen, Aleve, Aspirin, Meloxicam. May take Tylenol if needed for pain or discomfort.    __X__ Don't start taking any new herbal supplements prior to your procedure.

## 2019-03-03 ENCOUNTER — Ambulatory Visit: Payer: Medicare Other | Admitting: Anesthesiology

## 2019-03-03 ENCOUNTER — Encounter: Payer: Self-pay | Admitting: Surgery

## 2019-03-03 ENCOUNTER — Encounter
Admission: RE | Disposition: A | Discharge status: Home / Self Care | Payer: Self-pay | Source: Home / Self Care | Attending: Surgery

## 2019-03-03 ENCOUNTER — Ambulatory Visit
Admission: RE | Admit: 2019-03-03 | Discharge: 2019-03-03 | Disposition: A | Discharge status: Home / Self Care | Payer: Medicare Other | Attending: Surgery | Admitting: Surgery

## 2019-03-03 ENCOUNTER — Other Ambulatory Visit: Payer: Self-pay

## 2019-03-03 DIAGNOSIS — Z79899 Other long term (current) drug therapy: Secondary | ICD-10-CM | POA: Diagnosis not present

## 2019-03-03 DIAGNOSIS — I2510 Atherosclerotic heart disease of native coronary artery without angina pectoris: Secondary | ICD-10-CM | POA: Insufficient documentation

## 2019-03-03 DIAGNOSIS — I10 Essential (primary) hypertension: Secondary | ICD-10-CM | POA: Insufficient documentation

## 2019-03-03 DIAGNOSIS — Z9849 Cataract extraction status, unspecified eye: Secondary | ICD-10-CM | POA: Diagnosis not present

## 2019-03-03 DIAGNOSIS — K588 Other irritable bowel syndrome: Secondary | ICD-10-CM | POA: Insufficient documentation

## 2019-03-03 DIAGNOSIS — M1611 Unilateral primary osteoarthritis, right hip: Secondary | ICD-10-CM | POA: Insufficient documentation

## 2019-03-03 DIAGNOSIS — Z8249 Family history of ischemic heart disease and other diseases of the circulatory system: Secondary | ICD-10-CM | POA: Insufficient documentation

## 2019-03-03 DIAGNOSIS — L989 Disorder of the skin and subcutaneous tissue, unspecified: Secondary | ICD-10-CM

## 2019-03-03 DIAGNOSIS — K5790 Diverticulosis of intestine, part unspecified, without perforation or abscess without bleeding: Secondary | ICD-10-CM | POA: Insufficient documentation

## 2019-03-03 DIAGNOSIS — N400 Enlarged prostate without lower urinary tract symptoms: Secondary | ICD-10-CM | POA: Diagnosis not present

## 2019-03-03 DIAGNOSIS — L821 Other seborrheic keratosis: Secondary | ICD-10-CM | POA: Diagnosis not present

## 2019-03-03 DIAGNOSIS — I480 Paroxysmal atrial fibrillation: Secondary | ICD-10-CM | POA: Diagnosis not present

## 2019-03-03 DIAGNOSIS — K219 Gastro-esophageal reflux disease without esophagitis: Secondary | ICD-10-CM | POA: Diagnosis not present

## 2019-03-03 DIAGNOSIS — Z86010 Personal history of colonic polyps: Secondary | ICD-10-CM | POA: Diagnosis not present

## 2019-03-03 DIAGNOSIS — Z8719 Personal history of other diseases of the digestive system: Secondary | ICD-10-CM | POA: Diagnosis not present

## 2019-03-03 DIAGNOSIS — I493 Ventricular premature depolarization: Secondary | ICD-10-CM | POA: Insufficient documentation

## 2019-03-03 DIAGNOSIS — Z7901 Long term (current) use of anticoagulants: Secondary | ICD-10-CM | POA: Insufficient documentation

## 2019-03-03 DIAGNOSIS — Z85820 Personal history of malignant melanoma of skin: Secondary | ICD-10-CM | POA: Insufficient documentation

## 2019-03-03 DIAGNOSIS — I2782 Chronic pulmonary embolism: Secondary | ICD-10-CM | POA: Insufficient documentation

## 2019-03-03 DIAGNOSIS — Z823 Family history of stroke: Secondary | ICD-10-CM | POA: Diagnosis not present

## 2019-03-03 DIAGNOSIS — Z87891 Personal history of nicotine dependence: Secondary | ICD-10-CM | POA: Insufficient documentation

## 2019-03-03 DIAGNOSIS — Z6830 Body mass index (BMI) 30.0-30.9, adult: Secondary | ICD-10-CM | POA: Insufficient documentation

## 2019-03-03 DIAGNOSIS — E669 Obesity, unspecified: Secondary | ICD-10-CM | POA: Diagnosis not present

## 2019-03-03 DIAGNOSIS — K449 Diaphragmatic hernia without obstruction or gangrene: Secondary | ICD-10-CM | POA: Insufficient documentation

## 2019-03-03 HISTORY — DX: Cardiac arrhythmia, unspecified: I49.9

## 2019-03-03 HISTORY — PX: MASS EXCISION: SHX2000

## 2019-03-03 SURGERY — EXCISION MASS
Anesthesia: General | Site: Arm Lower | Laterality: Left

## 2019-03-03 MED ORDER — DEXAMETHASONE SODIUM PHOSPHATE 10 MG/ML IJ SOLN
INTRAMUSCULAR | Status: AC
  Filled 2019-03-03: qty 1

## 2019-03-03 MED ORDER — ACETAMINOPHEN 325 MG PO TABS: 650.0000 mg | ORAL_TABLET | Freq: Three times a day (TID) | ORAL | 0 refills | Status: DC | PRN

## 2019-03-03 MED ORDER — ONDANSETRON HCL 4 MG/2ML IJ SOLN: 4.0000 mg | Freq: Once | INTRAMUSCULAR | Status: DC | PRN

## 2019-03-03 MED ORDER — FAMOTIDINE 20 MG PO TABS
ORAL_TABLET | ORAL | Status: AC
  Administered 2019-03-03: 20 mg via ORAL
  Filled 2019-03-03: qty 1

## 2019-03-03 MED ORDER — FENTANYL CITRATE (PF) 100 MCG/2ML IJ SOLN: 25.0000 ug | INTRAMUSCULAR | Status: DC | PRN

## 2019-03-03 MED ORDER — LACTATED RINGERS IV SOLN: INTRAVENOUS | Status: DC | PRN

## 2019-03-03 MED ORDER — HYDROCODONE-ACETAMINOPHEN 5-325 MG PO TABS: 1.0000 | ORAL_TABLET | Freq: Four times a day (QID) | ORAL | 0 refills | Status: DC | PRN

## 2019-03-03 MED ORDER — PROPOFOL 500 MG/50ML IV EMUL
INTRAVENOUS | Status: AC
  Filled 2019-03-03: qty 50

## 2019-03-03 MED ORDER — LIDOCAINE HCL (PF) 1 % IJ SOLN
INTRAMUSCULAR | Status: AC
  Filled 2019-03-03: qty 30

## 2019-03-03 MED ORDER — ONDANSETRON HCL 4 MG/2ML IJ SOLN
INTRAMUSCULAR | Status: DC | PRN
  Administered 2019-03-03: 4 mg via INTRAVENOUS

## 2019-03-03 MED ORDER — ONDANSETRON HCL 4 MG/2ML IJ SOLN
INTRAMUSCULAR | Status: AC
  Filled 2019-03-03: qty 2

## 2019-03-03 MED ORDER — LACTATED RINGERS IV SOLN
INTRAVENOUS | Status: DC
  Administered 2019-03-03: 50 mL/h via INTRAVENOUS

## 2019-03-03 MED ORDER — BACITRACIN ZINC 500 UNIT/GM EX OINT
TOPICAL_OINTMENT | CUTANEOUS | Status: AC
  Filled 2019-03-03: qty 28.35

## 2019-03-03 MED ORDER — PROPOFOL 10 MG/ML IV BOLUS
INTRAVENOUS | Status: DC | PRN
  Administered 2019-03-03: 120 mg via INTRAVENOUS

## 2019-03-03 MED ORDER — SEVOFLURANE IN SOLN
RESPIRATORY_TRACT | Status: AC
  Filled 2019-03-03: qty 250

## 2019-03-03 MED ORDER — LIDOCAINE HCL 1 % IJ SOLN
INTRAMUSCULAR | Status: DC | PRN
  Administered 2019-03-03: 2.5 mL

## 2019-03-03 MED ORDER — EPHEDRINE SULFATE 50 MG/ML IJ SOLN
INTRAMUSCULAR | Status: AC
  Filled 2019-03-03: qty 1

## 2019-03-03 MED ORDER — DEXAMETHASONE SODIUM PHOSPHATE 10 MG/ML IJ SOLN
INTRAMUSCULAR | Status: DC | PRN
  Administered 2019-03-03: 5 mg via INTRAVENOUS

## 2019-03-03 MED ORDER — BUPIVACAINE-EPINEPHRINE 0.5% -1:200000 IJ SOLN
INTRAMUSCULAR | Status: DC | PRN
  Administered 2019-03-03: 2.5 mL

## 2019-03-03 MED ORDER — IBUPROFEN 400 MG PO TABS: 800.0000 mg | ORAL_TABLET | Freq: Three times a day (TID) | ORAL | 0 refills | Status: DC | PRN

## 2019-03-03 MED ORDER — DOCUSATE SODIUM 100 MG PO CAPS: 100.0000 mg | ORAL_CAPSULE | Freq: Two times a day (BID) | ORAL | 0 refills | Status: DC | PRN

## 2019-03-03 MED ORDER — BUPIVACAINE-EPINEPHRINE (PF) 0.5% -1:200000 IJ SOLN
INTRAMUSCULAR | Status: AC
  Filled 2019-03-03: qty 30

## 2019-03-03 MED ORDER — LIDOCAINE HCL (CARDIAC) PF 100 MG/5ML IV SOSY
PREFILLED_SYRINGE | INTRAVENOUS | Status: DC | PRN
  Administered 2019-03-03: 100 mg via INTRAVENOUS

## 2019-03-03 MED ORDER — CEFAZOLIN SODIUM-DEXTROSE 2-4 GM/100ML-% IV SOLN
2.0000 g | INTRAVENOUS | Status: AC
  Administered 2019-03-03: 2 g via INTRAVENOUS

## 2019-03-03 MED ORDER — FENTANYL CITRATE (PF) 100 MCG/2ML IJ SOLN
INTRAMUSCULAR | Status: AC
  Filled 2019-03-03: qty 2

## 2019-03-03 MED ORDER — FENTANYL CITRATE (PF) 100 MCG/2ML IJ SOLN
INTRAMUSCULAR | Status: DC | PRN
  Administered 2019-03-03 (×2): 25 ug via INTRAVENOUS

## 2019-03-03 MED ORDER — CHLORHEXIDINE GLUCONATE CLOTH 2 % EX PADS: 6.0000 | MEDICATED_PAD | Freq: Once | CUTANEOUS | Status: DC

## 2019-03-03 MED ORDER — PHENYLEPHRINE HCL (PRESSORS) 10 MG/ML IV SOLN
INTRAVENOUS | Status: AC
  Filled 2019-03-03: qty 1

## 2019-03-03 MED ORDER — FAMOTIDINE 20 MG PO TABS: 20.0000 mg | ORAL_TABLET | Freq: Once | ORAL | Status: AC

## 2019-03-03 MED ORDER — CEFAZOLIN SODIUM-DEXTROSE 2-4 GM/100ML-% IV SOLN
INTRAVENOUS | Status: AC
  Filled 2019-03-03: qty 100

## 2019-03-03 SURGICAL SUPPLY — 32 items
BLADE SURG 15 STRL LF DISP TIS (BLADE) ×1 IMPLANT
BLADE SURG 15 STRL SS (BLADE) ×2
CHLORAPREP W/TINT 26 (MISCELLANEOUS) ×3 IMPLANT
COVER WAND RF STERILE (DRAPES) ×3 IMPLANT
DERMABOND ADVANCED (GAUZE/BANDAGES/DRESSINGS)
DERMABOND ADVANCED .7 DNX12 (GAUZE/BANDAGES/DRESSINGS) IMPLANT
DRAPE 3/4 80X56 (DRAPES) ×3 IMPLANT
DRAPE LAPAROTOMY 100X77 ABD (DRAPES) ×3 IMPLANT
ELECT CAUTERY BLADE 6.4 (BLADE) ×3 IMPLANT
ELECT REM PT RETURN 9FT ADLT (ELECTROSURGICAL) ×3
ELECTRODE REM PT RTRN 9FT ADLT (ELECTROSURGICAL) ×1 IMPLANT
GAUZE SPONGE 4X4 12PLY STRL (GAUZE/BANDAGES/DRESSINGS) ×3 IMPLANT
GLOVE BIOGEL PI IND STRL 7.0 (GLOVE) ×1 IMPLANT
GLOVE BIOGEL PI INDICATOR 7.0 (GLOVE) ×2
GLOVE SURG SYN 6.5 ES PF (GLOVE) ×6 IMPLANT
GOWN STRL REUS W/ TWL LRG LVL3 (GOWN DISPOSABLE) ×3 IMPLANT
GOWN STRL REUS W/TWL LRG LVL3 (GOWN DISPOSABLE) ×6
KIT TURNOVER KIT A (KITS) ×3 IMPLANT
LABEL OR SOLS (LABEL) ×3 IMPLANT
NEEDLE HYPO 22GX1.5 SAFETY (NEEDLE) ×3 IMPLANT
NS IRRIG 1000ML POUR BTL (IV SOLUTION) ×3 IMPLANT
PACK BASIN MINOR ARMC (MISCELLANEOUS) ×3 IMPLANT
SUT ETHILON 3-0 FS-10 30 BLK (SUTURE) ×3
SUT MNCRL 4-0 (SUTURE) ×2
SUT MNCRL 4-0 27XMFL (SUTURE) ×1
SUT VIC AB 3-0 SH 27 (SUTURE) ×2
SUT VIC AB 3-0 SH 27X BRD (SUTURE) ×1 IMPLANT
SUTURE EHLN 3-0 FS-10 30 BLK (SUTURE) ×1 IMPLANT
SUTURE MNCRL 4-0 27XMF (SUTURE) ×1 IMPLANT
SYR 10ML LL (SYRINGE) ×3 IMPLANT
SYR 30ML LL (SYRINGE) ×3 IMPLANT
TOWEL OR 17X26 4PK STRL BLUE (TOWEL DISPOSABLE) ×3 IMPLANT

## 2019-03-03 NOTE — Anesthesia Postprocedure Evaluation (Signed)
Anesthesia Post Note  Patient: Jordan Figueroa  Procedure(s) Performed: EXCISION MASS (Left Arm Lower)  Patient location during evaluation: PACU Anesthesia Type: General Level of consciousness: awake and alert and oriented Pain management: pain level controlled Vital Signs Assessment: post-procedure vital signs reviewed and stable Respiratory status: spontaneous breathing, nonlabored ventilation and respiratory function stable Cardiovascular status: blood pressure returned to baseline and stable Postop Assessment: no signs of nausea or vomiting Anesthetic complications: no     Last Vitals:  Vitals:   03/03/19 0855 03/03/19 0904  BP: 124/73 (!) 124/52  Pulse: (!) 38 65  Resp: (!) 23 18  Temp: (!) 36.1 C (!) 36.2 C  SpO2: 92% 94%    Last Pain:  Vitals:   03/03/19 0904  TempSrc: Temporal  PainSc: 0-No pain                 Anan Dapolito

## 2019-03-03 NOTE — H&P (Signed)
Subjective:   CC: Pyogenic granuloma of skin and subcutaneous tissue [L98.0]  HPI: Jordan Figueroa is a 86 y.o. male who was referred by Lurlean Leyden, * for evaluation of above. First noted a few weeks ago. Symptoms include: asymptomatic. Exacerbated by nothing. Alleviated by nothing. Associated with nothing.   Episodes of bleeding in the past.  Past Medical History: has a past medical history of 2-vessel coronary artery disease (12/09/2015), A-fib (CMS-HCC) (04/16/2015), Adenomatous colon polyp, unspecified, Adenomatous colon polyp, unspecified, Adenomatous colon polyp, unspecified, BPH (benign prostatic hyperplasia), Chronic pulmonary embolism (CMS-HCC) (04/16/2015), Diverticulosis (07/04/2013), Diverticulosis (07/04/2013), Elevated PSA, Gastritis, H. pylori infection, Hemorrhoids, Melanoma (CMS-HCC), Personal history of malignant melanoma of skin (11/03/2012), and Primary osteoarthritis of right hip (07/30/2016).  Past Surgical History: has a past surgical history that includes cardiac catheterization (2004 ); Cataract extraction; Hemorrhoidectomy External; and Colonoscopy.  Family History: family history includes Coronary Artery Disease (Blocked arteries around heart) in his brother and father; Myocardial Infarction (Heart attack) in his brother; Stroke in his father.  Social History: reports that he has quit smoking. He has never used smokeless tobacco. No history on file for alcohol and drug.  Current Medications: has a current medication list which includes the following prescription(s): eliquis, finasteride, furosemide, gabapentin, isosorbide mononitrate, metoprolol succinate, nitroglycerin, and omeprazole.  Allergies:  No Known Allergies  ROS:  A 15 point review of systems was performed and pertinent positives and negatives noted in HPI  Objective:    Ht 180.3 cm (5\' 11" )  Wt (!) 101.6 kg (224 lb)  BMI 31.24 kg/m   Constitutional : alert, appears stated age, cooperative and  no distress  Lymphatics/Throat: no asymmetry, masses, or scars  Respiratory: clear to auscultation bilaterally  Cardiovascular: regular rate and rhythm  Gastrointestinal: soft, non-tender; bowel sounds normal; no masses, no organomegaly.  Musculoskeletal: Steady gait and movement  Skin: Cool and moist, 3.5cm raised, red nodule, with some ulceration at apex secondary to previous attempt at aspiration by patient, no active bleeding today. Mobile and soft. LEFT forearm  Psychiatric: Normal affect, non-agitated, not confused    LABS:  N/a   RADS: n/a  Assessment:    Pyogenic granuloma of skin and subcutaneous tissue [L98.0] LEFT forearm  Plan:    1. Pyogenic granuloma of skin and subcutaneous tissue [L98.0] Discussed surgical excision. Alternatives include continued observation. Benefits include possible symptom relief, pathologic evaluation, improved cosmesis. Discussed the risk of surgery including recurrence, chronic pain, post-op infxn, poor cosmesis, poor/delayed wound healing, and possible re-operation to address said risks. The risks of general anesthetic, if used, includes MI, CVA, sudden death or even reaction to anesthetic medications also discussed.  Typical post-op recovery time of 3-5 days with possible activity restrictions were also discussed.  The patient verbalized understanding and all questions were answered to the patient's satisfaction.  2. Patient has elected to proceed with excisional biopsy. Procedure will be scheduled. Written consent was obtained. Hold eliquiss prior to procedure.

## 2019-03-03 NOTE — Interval H&P Note (Signed)
History and Physical Interval Note:  03/03/2019 7:20 AM  Jordan Figueroa  has presented today for surgery, with the diagnosis of L98.0 pyogenic granuloma of skin and subcutaneous tissue of lt forearm.  The various methods of treatment have been discussed with the patient and family. After consideration of risks, benefits and other options for treatment, the patient has consented to  Procedure(s): EXCISION MASS (Left) as a surgical intervention.  The patient's history has been reviewed, patient examined, no change in status, stable for surgery.  I have reviewed the patient's chart and labs.  Questions were answered to the patient's satisfaction.     Daly Whipkey Lysle Pearl

## 2019-03-03 NOTE — Transfer of Care (Signed)
Immediate Anesthesia Transfer of Care Note  Patient: Jordan Figueroa  Procedure(s) Performed: EXCISION MASS (Left Arm Lower)  Patient Location: PACU  Anesthesia Type:General  Level of Consciousness: sedated  Airway & Oxygen Therapy: Patient Spontanous Breathing and Patient connected to face mask oxygen  Post-op Assessment: Report given to RN and Post -op Vital signs reviewed and stable  Post vital signs: Reviewed and stable  Last Vitals:  Vitals Value Taken Time  BP 129/63 03/03/19 0825  Temp    Pulse 70 03/03/19 0828  Resp 20 03/03/19 0828  SpO2 94 % 03/03/19 0828  Vitals shown include unvalidated device data.  Last Pain:  Vitals:   03/03/19 0615  TempSrc: Tympanic  PainSc: 4       Patients Stated Pain Goal: 0 ([id number])  Complications: No apparent anesthesia complications

## 2019-03-03 NOTE — Discharge Instructions (Addendum)
Removal, Care After This sheet gives you information about how to care for yourself after your procedure. Your health care provider may also give you more specific instructions. If you have problems or questions, contact your health care provider. What can I expect after the procedure? After the procedure, it is common to have:  Soreness.  Bruising.  Itching. Follow these instructions at home: site care Follow instructions from your health care provider about how to take care of your site. Make sure you:  Wash your hands with soap and water before and after you change your bandage (dressing). If soap and water are not available, use hand sanitizer.  Leave stitches (sutures), skin glue, or adhesive strips in place. These skin closures may need to stay in place for 2 weeks or longer. If adhesive strip edges start to loosen and curl up, you may trim the loose edges. Do not remove adhesive strips completely unless your health care provider tells you to do that.  If the area bleeds or bruises, apply gentle pressure for 10 minutes.  KEEP DRESSING INTACT FOR 48HRS, THEN OK TO REMOVE AND SHOWER.  KEEP SUTURES COVERED UNTIL SEEN IN OFFICE.  RESUME ELIQUIS 48HRS AFTER SURGERY  Check your site every day for signs of infection. Check for:  Redness, swelling, or pain.  Fluid or blood.  Warmth.  Pus or a bad smell.  General instructions  Rest and then return to your normal activities as told by your health care provider. .  tylenol and advil as needed for discomfort.  Please alternate between the two every four hours as needed for pain.   .  Use narcotics, if prescribed, only when tylenol and motrin is not enough to control pain. .  325-650mg  every 8hrs to max of 3000mg /24hrs (including the 325mg  in every norco dose) for the tylenol.   .  Advil up to 800mg  per dose every 8hrs as needed for pain.    Keep all follow-up visits as told by your health care provider. This is important. Contact  a health care provider if:  You have redness, swelling, or pain around your site.  You have fluid or blood coming from your site.  Your site feels warm to the touch.  You have pus or a bad smell coming from your site.  You have a fever.  Your sutures, skin glue, or adhesive strips loosen or come off sooner than expected. Get help right away if:  You have bleeding that does not stop with pressure or a dressing. Summary  After the procedure, it is common to have some soreness, bruising, and itching at the site.  Follow instructions from your health care provider about how to take care of your site.  Check your site every day for signs of infection.  Contact a health care provider if you have redness, swelling, or pain around your site, or your site feels warm to the touch.  Keep all follow-up visits as told by your health care provider. This is important. This information is not intended to replace advice given to you by your health care provider. Make sure you discuss any questions you have with your health care provider. Document Released: 01/25/2015 Document Revised: 06/28/2017 Document Reviewed: 06/28/2017 Elsevier Interactive Patient Education  2019 [place]   1) The drugs that you were given will stay in your system until tomorrow so for the next 24 hours you should not:  A) Drive an automobile B)  Make any legal decisions C) Drink any alcoholic beverage   2) You may resume regular meals tomorrow.  Today it is better to start with liquids and gradually work up to solid foods.  You may eat anything you prefer, but it is better to start with liquids, then soup and crackers, and gradually work up to solid foods.   3) Please notify your doctor immediately if you have any unusual bleeding, trouble breathing, redness and pain at the surgery site, drainage, fever, or pain not relieved by medication.    4) Additional  Instructions:        Please contact your physician with any problems or Same Day Surgery at [phone], Monday through Friday 6 am to 4 pm, or  at [facility] number at [phone].

## 2019-03-03 NOTE — Anesthesia Procedure Notes (Signed)
Procedure Name: LMA Insertion Date/Time: 03/03/2019 7:38 AM Performed by: Justus Memory, CRNA Pre-anesthesia Checklist: Patient identified, Patient being monitored, Timeout performed, Emergency Drugs available and Suction available Patient Re-evaluated:Patient Re-evaluated prior to induction Oxygen Delivery Method: Circle system utilized Preoxygenation: Pre-oxygenation with 100% oxygen Induction Type: IV induction Ventilation: Mask ventilation without difficulty LMA: LMA inserted LMA Size: 5.0 Tube type: Oral Number of attempts: 1 Placement Confirmation: positive ETCO2 and breath sounds checked- equal and bilateral Tube secured with: Tape Dental Injury: Teeth and Oropharynx as per pre-operative assessment  Comments: LMA airQ 4.5 unable to obtain seal, placed size 5 LMA, adequate TV's, good seal noted

## 2019-03-03 NOTE — Op Note (Signed)
Pre-Op Dx: Left arm lesion Post-Op Dx: Same Anesthesia: LMA EBL: Minimal Complications:  none apparent Specimen: Left arm lesion Procedure: excisional biopsy of left arm lesion Surgeon: Lysle Pearl  Indications for procedure: 86 year old male with left arm lesion on distal forearm.  Here today for excisional biopsy.  Description of Procedure:  Consent obtained, time out performed.  Patient placed in supine position.  Area sterilized and draped in usual position.  Local infused to area previously marked.  5 cm cm elliptical incision made around the 1 cm in diameter lesion and through dermis with 15blade down to subcutaneous layer.  The  1 cm x 1 cm skin lesion removed from surrounding tissue completely using electrocautery, passed off field pending pathology.  Wound hemostasis noted, then closed interrupted 3-0 nylon's in a vertical mattress fashion.  Wound then dressed with Neosporin and 4 x 4 secured with paper tape pt tolerated procedure well, and transferred to PACU in stable condition. Sponge and instrument count correct at end of procedure.

## 2019-03-03 NOTE — OR Nursing (Signed)
OR incision not documented in epic flowsheet.   Site to left arm with gauze and paper tape - clean dry and intact.

## 2019-03-03 NOTE — Anesthesia Preprocedure Evaluation (Addendum)
Anesthesia Evaluation  Patient identified by MRN, date of birth, ID band Patient awake    Reviewed: Allergy & Precautions, NPO status , Patient's Chart, lab work & pertinent test results  History of Anesthesia Complications Negative for: history of anesthetic complications  Airway Mallampati: II  TM Distance: >3 FB Neck ROM: Full    Dental  (+) Implants   Pulmonary neg sleep apnea, neg COPD, former smoker,  Recent covid infection    breath sounds clear to auscultation- rhonchi (-) wheezing      Cardiovascular hypertension, + CAD (nonocclusive)  (-) Cardiac Stents and (-) CABG + dysrhythmias Atrial Fibrillation  Rhythm:Regular Rate:Normal - Systolic murmurs and - Diastolic murmurs Echo [id number]: 1. The left ventricle has normal systolic function with an ejection  fraction of 60-65%. The cavity size was normal. Left ventricular diastolic  Doppler parameters are consistent with impaired relaxation.  2. The right ventricle has normal systolic function. The cavity was  normal. There is no increase in right ventricular wall thickness. Unable  to estimate RVSP    Neuro/Psych neg Seizures negative neurological ROS  negative psych ROS   GI/Hepatic Neg liver ROS, hiatal hernia, GERD  ,  Endo/Other  negative endocrine ROSneg diabetes  Renal/GU negative Renal ROS     Musculoskeletal  (+) Arthritis ,   Abdominal (+) + obese,   Peds  Hematology negative hematology ROS (+)   Anesthesia Other Findings Past Medical History: No date: Arthritis     Comment:  osteo-right hip No date: Cancer [facility])     Comment:  skin No date: Colon polyps No date: Dyspnea on exertion     Comment:  a. 03/2015 Echo: Ef 60-65%, Gr1 DD, mild MR, nl RV fxn,               mild PAH; b. 06/2015 RHC: minimal elevated PCWP; c.                04/2017 Echo: EF 60-65%, no rwma, GR1 DD, triv AI, mild               MR, mildly dil LA, nl RV fxn, nl PASP. No  date: Dysrhythmia No date: GERD (gastroesophageal reflux disease) No date: Hemorrhoids No date: Hiatal hernia No date: HOH (hard of hearing)     Comment:  aides No date: Hypertension No date: IBS (irritable bowel syndrome) No date: Non-obstructive CAD (coronary artery disease)     Comment:  a. 06/2015 Cath: LM 5, LAD 5ost, 30p, 69m, LCX nl, OM1               min irregs, OM2 nl, RCA 20p, 91m, RPDA/RPL1 min irregs,               RPAV nl; b. 06/2017 Lexiscan MV: EF 55-65%, low risk. No date: PAF (paroxysmal atrial fibrillation) (HCC)     Comment:  a. Reported - no documentation in our records. Never               seen on monitoring. March 2017: Paroxysmal supraventricular tachycardia ([place])     Comment:  First documented during episode of influenza No date: Pulmonary embolism (HCC)     Comment:  a. Chronic eliquis. No date: PVC's (premature ventricular contractions)     Comment:  a. 06/2017 24h Holter: Avg HR 67 (49-110), rare PACs up               to 5 beat run, freq PVC's - 8% burden; b. 08/2017 Zio:  Avg              HR 64 (48-171), occas PACs & PVCs. 4 episodes of NSVT (4               beats longest). 48 episodes of SVT up to 12.4 secs, max               rate 171.   Reproductive/Obstetrics                            Anesthesia Physical Anesthesia Plan  ASA: III  Anesthesia Plan: General   Post-op Pain Management:    Induction: Intravenous  PONV Risk Score and Plan: 1 and Ondansetron and Dexamethasone  Airway Management Planned: LMA  Additional Equipment:   Intra-op Plan:   Post-operative Plan:   Informed Consent: I have reviewed the patients History and Physical, chart, labs and discussed the procedure including the risks, benefits and alternatives for the proposed anesthesia with the patient or authorized representative who has indicated his/her understanding and acceptance.     Dental advisory given  Plan Discussed with: CRNA and  Anesthesiologist  Anesthesia Plan Comments:         Anesthesia Quick Evaluation

## 2019-03-06 LAB — SURGICAL PATHOLOGY

## 2019-03-06 NOTE — Progress Notes (Signed)
Follow-up Outpatient Visit Date: 03/08/2019  Primary Care Provider: Kirk Ruths, MD [facility] [place] [address]  Chief Complaint: Dyspnea on exertion  HPI:  Mr. Scurti is a 86 y.o. adult with history of non-obstructive coronary artery disease, pulmonary embolism, paroxysmal SVT, questionable paroxysmal atrial fibrillation, hiatal hernia, IBS, and GERD, who presents for follow-up of coronary artery disease and atrial fibrillation.  I last saw him in 08/2018, at which time Mr. Bezio was doing relatively well.  He noted mild chest soreness following esophageal dilation.  He did not have any further chest tightness after previous addition of isosorbide mononitrate.  Exertional dyspnea was stable.  Today, Mr. Skyles is doing well.  He reports stable exertional dyspnea but has not had any chest pain, palpitations, or lightheadedness.  He notes occasional left lower extremity edema, which has been chronic and is controlled with compression stocking use.  He underwent excision of a cyst in the left forearm last week.  This had to be delayed because he tested positive for COVID-19 in early February as part of preprocedure testing.  Interestingly, he had just received his first COVID vaccine a week earlier.  He was minimally symptomatic with Covid, noting only cough and loss of taste.  He continues to have loss of taste but otherwise feels back to normal.  He had been taking low-dose metoprolol for history of PSVT, though it appears this was discontinued around the time of his left forearm cyst excision last week due to bradycardia.  --------------------------------------------------------------------------------------------------  Cardiovascular History & Procedures: Cardiovascular Problems:  Non-obstructive CAD  ? Paroxysmal atrial fibrillation  Paroxysmal SVT  Pulmonary embolism  Risk Factors:  Known CAD, age, and male  gender  Cath/PCI:  LHC/RHC (06/28/15):Mild diffuse disease involving the left coronary artery. 20-25% proximal and mid/distal RCA lesions. Mildly elevated left heart filling pressure. Normal right heart filling and pulmonary artery pressures. Normal LVEF with mildly reduced Fick cardiac output/index.  CV Surgery:  None  EP Procedures and Devices:  Event monitor (08/24/2017): Predominantly sinus rhythm with occasional PACs and PVCs. 48 episodes of PSVT up to 12.4 seconds.  24-hour Holter monitor (06/22/2017): This rhythm with frequent PVCs (8% burden) and rare PACs. Single brief atrial run noted.  Non-Invasive Evaluation(s):  Transthoracic echocardiogram (07/28/2018): Normal LV size and wall thickness.  LVEF 60-65% with grade 1 diastolic dysfunction.  Normal RV size and function.  No significant valvular abnormality.  Pharmacologic MPI (04/12/2018): Low risk study without ischemia or scar.  LVEF calculated at 40%, likely artifactually low due to GI uptake.  Transthoracic echocardiogram(04/29/2017): Normal LV size and LVEF (60 to 65%) with grade 1 diastolic dysfunction. Trivial AI and mild MR. Mild left atrial enlargement. Normal RV contraction.  Exercise myocardial perfusion stress test (06/07/15): Low risk scan without evidence of ischemia. Apical thinning noted. LVEF reduced at 40%, a likely artifactual.  Transthoracic echocardiogram (04/10/15): Normal LV size and function with LVEF is 60-65%. Grade 1 diastolic dysfunction. Mild mitral regurgitation. Normal RV size and function. Mild pulmonary hypertension.  Recent CV Pertinent Labs: Lab Results  Component Value Date   CHOL 109 07/28/2018   HDL 38 (L) 07/28/2018   LDLCALC 61 07/28/2018   TRIG 48 07/28/2018   CHOLHDL 2.9 07/28/2018   INR 1.23 06/26/2015   BNP 87.0 04/10/2015   K 3.9 08/09/2018   K 4.1 07/17/2013   MG 2.3 08/09/2018   BUN 15 08/09/2018   BUN 9 07/17/2013   CREATININE 0.95  08/09/2018   CREATININE 0.92  07/17/2013    Past medical and surgical history were reviewed and updated in EPIC.  Current Meds  Medication Sig  . apixaban (ELIQUIS) 5 MG TABS tablet Take 1 tablet (5 mg total) by mouth 2 (two) times daily.  . finasteride (PROSCAR) 5 MG tablet Take 1 tablet (5 mg total) by mouth daily.  . furosemide (LASIX) 40 MG tablet TAKE 1 TABLET BY MOUTH EVERY DAY (Patient taking differently: Take 40 mg by mouth daily. )  . gabapentin (NEURONTIN) 100 MG capsule Take 100 mg by mouth 3 (three) times daily.  . isosorbide mononitrate (IMDUR) 30 MG 24 hr tablet TAKE 0.5 TABLETS (15 MG TOTAL) BY MOUTH DAILY.  . nitroGLYCERIN (NITROSTAT) 0.4 MG SL tablet Place 1 tablet (0.4 mg total) under the tongue every 5 (five) minutes as needed for chest pain. Maximum of 3 doses.    Allergies: Patient has no known allergies.  Social History   Tobacco Use  . Smoking status: Former Smoker    Packs/day: 2.00    Years: 25.00    Pack years: 50.00    Types: Cigarettes    Quit date: 02/25/1973    Years since quitting: 46.0  . Smokeless tobacco: Never Used  Substance Use Topics  . Alcohol use: No    Alcohol/week: 0.0 standard drinks  . Drug use: Never    Family History  Problem Relation Age of Onset  . CVA Father   . CAD Father   . Alzheimer's disease Mother   . Heart Problems Sister   . Heart attack Brother   . CAD Brother   . Prostate cancer Neg Hx   . Bladder Cancer Neg Hx   . Kidney cancer Neg Hx     Review of Systems: A 12-system review of systems was performed and was negative except as noted in the HPI.  --------------------------------------------------------------------------------------------------  Physical Exam: BP 124/70 (BP Location: Left Arm, Patient Position: Sitting, Cuff Size: Normal)   Pulse 70   Ht 5\' 11"  (1.803 m)   Wt 218 lb 12 oz (99.2 kg)   SpO2 96%   BMI 30.51 kg/m   General: NAD. Neck: No JVD or HJR. Lungs: Clear to auscultation bilaterally without wheezes or  crackles. Heart: Distant heart sounds.  Regular rate and rhythm with occasional extrasystoles.  No murmurs, rubs, or gallops. Abdomen: Soft, nontender, nondistended. Extremities: Trace left calf edema.  EKG: Normal sinus rhythm with first-degree AV block, left axis deviation and PVCs.  PVCs are new from prior tracing on 09/01/2018.  Lab Results  Component Value Date   WBC 7.7 08/09/2018   HGB 15.5 08/09/2018   HCT 45.8 08/09/2018   MCV 88 08/09/2018   PLT 253 08/09/2018    Lab Results  Component Value Date   NA 141 08/09/2018   K 3.9 08/09/2018   CL 102 08/09/2018   CO2 26 08/09/2018   BUN 15 08/09/2018   CREATININE 0.95 08/09/2018   GLUCOSE 146 (H) 08/09/2018   ALT 22 04/10/2015    Lab Results  Component Value Date   CHOL 109 07/28/2018   HDL 38 (L) 07/28/2018   LDLCALC 61 07/28/2018   TRIG 48 07/28/2018   CHOLHDL 2.9 07/28/2018    --------------------------------------------------------------------------------------------------  ASSESSMENT AND PLAN: Coronary artery disease with stable angina: Mr. Saltarelli continues to do well without significant chest pain.  Exertional dyspnea is unchanged.  We will continue antianginal therapy with isosorbide mononitrate.  Lipid panel last month through Dr. Ouida Sills  showed excellent LDL at 62; we will defer adding statin therapy.  Chronic HFpEF: Other than chronic intermittent left lower extremity edema likely driven by venous insufficiency, Mr. Farag appears euvolemic with NYHA class II symptoms.  We will continue furosemide 40 mg daily.  Potassium and renal function were normal on last check with Dr. Ouida Sills a month ago.  PSVT and PVCs: Mr. Paley not had any notable palpitations.  EKG today shows sinus rhythm with PVCs and first-degree AV block.  He was previously on a low-dose metoprolol, though it appears this was discontinued around the time of his left forearm cyst excision the last week.  Some heart rate readings noted by  anesthesia are low but I otherwise see no mention of why the medication was stopped.  In the setting of first-degree AV block and lack of symptoms, I think it is reasonable to defer restarting it at this time.  Questionable paroxysmal atrial fibrillation and history of pulmonary embolism: Continue anticoagulation with apixaban, managed by Dr. Ouida Sills.  Follow-up: Return to clinic in 6 months.  Nelva Bush, MD 03/08/2019 8:53 AM

## 2019-03-08 ENCOUNTER — Encounter: Payer: Self-pay | Admitting: Internal Medicine

## 2019-03-08 ENCOUNTER — Ambulatory Visit (INDEPENDENT_AMBULATORY_CARE_PROVIDER_SITE_OTHER): Payer: Medicare Other | Admitting: Internal Medicine

## 2019-03-08 ENCOUNTER — Other Ambulatory Visit: Payer: Self-pay

## 2019-03-08 VITALS — BP 124/70 | HR 70 | Ht 71.0 in | Wt 218.8 lb

## 2019-03-08 DIAGNOSIS — I5032 Chronic diastolic (congestive) heart failure: Secondary | ICD-10-CM | POA: Diagnosis not present

## 2019-03-08 DIAGNOSIS — I25118 Atherosclerotic heart disease of native coronary artery with other forms of angina pectoris: Secondary | ICD-10-CM

## 2019-03-08 DIAGNOSIS — I471 Supraventricular tachycardia: Secondary | ICD-10-CM | POA: Diagnosis not present

## 2019-03-08 DIAGNOSIS — I493 Ventricular premature depolarization: Secondary | ICD-10-CM | POA: Diagnosis not present

## 2019-03-08 NOTE — Patient Instructions (Signed)
Medication Instructions:  Your physician recommends that you continue on your current medications as directed. Please refer to the Current Medication list given to you today.  *If you need a refill on your cardiac medications before your next appointment, please call your pharmacy*  Lab Work: none If you have labs (blood work) drawn today and your tests are completely normal, you will receive your results only by: Marland Kitchen MyChart Message (if you have MyChart) OR . A paper copy in the mail If you have any lab test that is abnormal or we need to change your treatment, we will call you to review the results.  Testing/Procedures: none  Follow-Up: At [facility], you and your health needs are our priority.  As part of our continuing mission to provide you with exceptional heart care, we have created designated Provider Care Teams.  These Care Teams include your primary Cardiologist (physician) and Advanced Practice Providers (APPs -  Physician Assistants and Nurse Practitioners) who all work together to provide you with the care you need, when you need it.  Your next appointment:   6 month(s)  The format for your next appointment:   In Person  Provider:    You may see Nelva Bush, MD or one of the following Advanced Practice Providers on your designated Care Team:    Murray Hodgkins, NP  [name], PA-C  Marrianne Mood, PA-C

## 2019-03-28 ENCOUNTER — Telehealth: Payer: Self-pay | Admitting: Dermatology

## 2019-03-28 NOTE — Telephone Encounter (Signed)
[facility] surgery visit reviewed- L forearm lesion was benign irritated seborrheic keratosis  Brendolyn Patty, MD

## 2019-03-31 ENCOUNTER — Other Ambulatory Visit: Payer: Self-pay | Admitting: Internal Medicine

## 2019-04-08 ENCOUNTER — Other Ambulatory Visit: Payer: Self-pay | Admitting: Internal Medicine

## 2019-05-15 ENCOUNTER — Ambulatory Visit: Payer: Medicare Other | Attending: Internal Medicine

## 2019-05-15 DIAGNOSIS — Z23 Encounter for immunization: Secondary | ICD-10-CM

## 2019-05-15 NOTE — Progress Notes (Signed)
   Covid-19 Vaccination Clinic  Name:  Jordan Figueroa    MRN: [id number] DOB: November 11, 1927  05/15/2019  [name] was observed post Covid-19 immunization for 15 minutes without incident. He was provided with Vaccine Information Sheet and instruction to access the V-Safe system.   [name] was instructed to call 911 with any severe reactions post vaccine: Marland Kitchen Difficulty breathing  . Swelling of face and throat  . A fast heartbeat  . A bad rash all over body  . Dizziness and weakness   Immunizations Administered    Name Date Dose VIS Date Route   Pfizer COVID-19 Vaccine 05/15/2019 12:02 PM 0.3 mL 03/08/2018 Intramuscular   Manufacturer: [place]   Lot: [id number]   [place]: [id number]

## 2019-07-25 ENCOUNTER — Ambulatory Visit (INDEPENDENT_AMBULATORY_CARE_PROVIDER_SITE_OTHER): Payer: Medicare Other | Admitting: Podiatry

## 2019-07-25 ENCOUNTER — Other Ambulatory Visit: Payer: Self-pay

## 2019-07-25 DIAGNOSIS — L989 Disorder of the skin and subcutaneous tissue, unspecified: Secondary | ICD-10-CM

## 2019-07-25 NOTE — Progress Notes (Signed)
   HPI: 86 y.o. adult presenting today for evaluation of symptomatic callus lesions that have developed to the bilateral feet.  Patient denies injury or trauma to the feet and he says that over the last few years calluses have developed and become very symptomatic.  Painful with walking whether he is wearing shoes or not.  He presents for further treatment evaluation  Past Medical History:  Diagnosis Date  . Arthritis    osteo-right hip  . Cancer ([place])    skin  . Colon polyps   . Dyspnea on exertion    a. 03/2015 Echo: Ef 60-65%, Gr1 DD, mild MR, nl RV fxn, mild PAH; b. 06/2015 RHC: minimal elevated PCWP; c.  04/2017 Echo: EF 60-65%, no rwma, GR1 DD, triv AI, mild MR, mildly dil LA, nl RV fxn, nl PASP.  Marland Kitchen Dysrhythmia   . GERD (gastroesophageal reflux disease)   . Hemorrhoids   . Hiatal hernia   . HOH (hard of hearing)    aides  . Hypertension   . IBS (irritable bowel syndrome)   . Non-obstructive CAD (coronary artery disease)    a. 06/2015 Cath: LM 5, LAD 5ost, 30p, 66m, LCX nl, OM1 min irregs, OM2 nl, RCA 20p, 84m, RPDA/RPL1 min irregs, RPAV nl; b. 06/2017 Lexiscan MV: EF 55-65%, low risk.  Marland Kitchen PAF (paroxysmal atrial fibrillation) ([place])    a. Reported - no documentation in our records. Never seen on monitoring.  . Paroxysmal supraventricular tachycardia [facility]) March 2017   First documented during episode of influenza  . Pulmonary embolism ([place])    a. Chronic eliquis.  Marland Kitchen PVC's (premature ventricular contractions)    a. 06/2017 24h Holter: Avg HR 67 (49-110), rare PACs up to 5 beat run, freq PVC's - 8% burden; b. 08/2017 Zio: Avg HR 64 (48-171), occas PACs & PVCs. 4 episodes of NSVT (4 beats longest). 48 episodes of SVT up to 12.4 secs, max rate 171.     Physical Exam: General: The patient is alert and oriented x3 in no acute distress.  Dermatology: Skin is warm, dry and supple bilateral lower extremities. Negative for open lesions or macerations.  Diffuse hyperkeratotic callus lesions noted  to the submetatarsal heads of the bilateral feet x4  Vascular: Palpable pedal pulses bilaterally.  There is some moderate pitting edema noted to the bilateral lower extremities. Capillary refill within normal limits.  Neurological: Epicritic and protective threshold grossly intact bilaterally.   Musculoskeletal Exam: Range of motion within normal limits to all pedal and ankle joints bilateral. Muscle strength 5/5 in all groups bilateral.   Assessment: 1.  Preulcerative callus lesions bilateral feet x4 2.  Bilateral lower extremity edema   Plan of Care:  1. Patient evaluated.  2.  Excisional debridement of the callus lesions was performed using a chisel blade without incident or bleeding 3.  Recommend compression hose daily 4.  Continue wearing good supportive shoes 5.  Return to clinic as needed      Edrick Kins, DPM [facility]  Dr. Edrick Kins, DPM    [address], [place] [address]                Office [phone]  Fax [phone]

## 2019-09-06 ENCOUNTER — Ambulatory Visit: Payer: Medicare Other | Admitting: Internal Medicine

## 2019-09-08 ENCOUNTER — Other Ambulatory Visit: Payer: Self-pay

## 2019-09-08 ENCOUNTER — Encounter: Payer: Self-pay | Admitting: Internal Medicine

## 2019-09-08 ENCOUNTER — Ambulatory Visit (INDEPENDENT_AMBULATORY_CARE_PROVIDER_SITE_OTHER): Payer: Medicare Other | Admitting: Internal Medicine

## 2019-09-08 VITALS — BP 140/50 | HR 66 | Ht 71.0 in | Wt 216.0 lb

## 2019-09-08 DIAGNOSIS — I5032 Chronic diastolic (congestive) heart failure: Secondary | ICD-10-CM

## 2019-09-08 DIAGNOSIS — I2510 Atherosclerotic heart disease of native coronary artery without angina pectoris: Secondary | ICD-10-CM | POA: Diagnosis not present

## 2019-09-08 DIAGNOSIS — I471 Supraventricular tachycardia: Secondary | ICD-10-CM | POA: Diagnosis not present

## 2019-09-08 DIAGNOSIS — I480 Paroxysmal atrial fibrillation: Secondary | ICD-10-CM

## 2019-09-08 DIAGNOSIS — I493 Ventricular premature depolarization: Secondary | ICD-10-CM

## 2019-09-08 NOTE — Patient Instructions (Signed)

## 2019-09-08 NOTE — Progress Notes (Signed)
Follow-up Outpatient Visit Date: 09/08/2019  Primary Care Provider: Kirk Ruths, MD [place] [address]  Chief Complaint: Follow-up CAD, PSVT, and questionable PAF  HPI:  [name] is a 86 y.o. adult with history of non-obstructive coronary artery disease, pulmonary embolism, paroxysmal SVT, questionable paroxysmal atrial fibrillation, hiatal hernia, IBS, and GERD, who presents for follow-up of CAD and PAF.  Last saw Jordan Figueroa in February, at which time he reported stable exertional dyspnea and occasional left lower extremity edema that has been chronic.  He had contracted COVID-19 in early February but was minimally symptomatic with this.  No medication changes or additional testing were pursued.  Today, Jordan Figueroa reports that he continues to do well.  He denies chest pain as well as shortness of breath with activities.  He notes occasional transient dyspnea when he is reading.  He has not had any orthopnea nor PND.  Chronic lower extremity edema, left greater than right, is stable.  He notes rare palpitations that he describes as skipped beats.  He has not had any lightheadedness or dizziness.  Other than a recent fall while trying to retrieve a suitcase from a high shelf resulting in abrasions on the right shin, Jordan Figueroa has not had any significant bleeding.  He opted to discontinue isosorbide mononitrate after our last visit in has not noticed any recurrent chest discomfort.  --------------------------------------------------------------------------------------------------  Cardiovascular History & Procedures: Cardiovascular Problems:  Non-obstructive CAD  ? Paroxysmal atrial fibrillation  Paroxysmal SVT  Pulmonary embolism  Risk Factors:  Known CAD, age, and male gender  Cath/PCI:  LHC/RHC (06/28/15):Mild diffuse disease involving the left coronary artery. 20-25% proximal and mid/distal RCA lesions. Mildly elevated  left heart filling pressure. Normal right heart filling and pulmonary artery pressures. Normal LVEF with mildly reduced Fick cardiac output/index.  CV Surgery:  None  EP Procedures and Devices:  Event monitor (08/24/2017): Predominantly sinus rhythm with occasional PACs and PVCs. 48 episodes of PSVT up to 12.4 seconds.  24-hour Holter monitor (06/22/2017): This rhythm with frequent PVCs (8% burden) and rare PACs. Single brief atrial run noted.  Non-Invasive Evaluation(s):  Transthoracic echocardiogram (07/28/2018): Normal LV size and wall thickness. LVEF 60-65% with grade 1 diastolic dysfunction. Normal RV size and function. No significant valvular abnormality.  Pharmacologic MPI (04/12/2018): Low risk study without ischemia or scar. LVEF calculated at 40%, likely artifactually low due to GI uptake.  Transthoracic echocardiogram(04/29/2017): Normal LV size and LVEF (60 to 65%) with grade 1 diastolic dysfunction. Trivial AI and mild MR. Mild left atrial enlargement. Normal RV contraction.  Exercise myocardial perfusion stress test (06/07/15): Low risk scan without evidence of ischemia. Apical thinning noted. LVEF reduced at 40%, a likely artifactual.  Transthoracic echocardiogram (04/10/15): Normal LV size and function with LVEF is 60-65%. Grade 1 diastolic dysfunction. Mild mitral regurgitation. Normal RV size and function. Mild pulmonary hypertension.  Recent CV Pertinent Labs: Lab Results  Component Value Date   CHOL 109 07/28/2018   HDL 38 (L) 07/28/2018   LDLCALC 61 07/28/2018   TRIG 48 07/28/2018   CHOLHDL 2.9 07/28/2018   INR 1.23 06/26/2015   BNP 87.0 04/10/2015   K 3.9 08/09/2018   K 4.1 07/17/2013   MG 2.3 08/09/2018   BUN 15 08/09/2018   BUN 9 07/17/2013   CREATININE 0.95 08/09/2018   CREATININE 0.92 07/17/2013    Past medical and surgical history were reviewed and updated in EPIC.  Current Meds  Medication Sig  . apixaban (ELIQUIS) 5 MG TABS tablet Take  1 tablet (5 mg total) by mouth 2 (two) times daily.  . finasteride (PROSCAR) 5 MG tablet Take 1 tablet (5 mg total) by mouth daily.  . furosemide (LASIX) 40 MG tablet TAKE 1 TABLET BY MOUTH EVERY DAY  . gabapentin (NEURONTIN) 100 MG capsule Take 100 mg by mouth 3 (three) times daily.  . nitroGLYCERIN (NITROSTAT) 0.4 MG SL tablet Place 1 tablet (0.4 mg total) under the tongue every 5 (five) minutes as needed for chest pain. Maximum of 3 doses.    Allergies: Patient has no known allergies.  Social History   Tobacco Use  . Smoking status: Former Smoker    Packs/day: 2.00    Years: 25.00    Pack years: 50.00    Types: Cigarettes    Quit date: 02/25/1973    Years since quitting: 46.5  . Smokeless tobacco: Never Used  Vaping Use  . Vaping Use: Never used  Substance Use Topics  . Alcohol use: No    Alcohol/week: 0.0 standard drinks  . Drug use: Never    Family History  Problem Relation Age of Onset  . CVA Father   . CAD Father   . Alzheimer's disease Mother   . Heart Problems Sister   . Heart attack Brother   . CAD Brother   . Prostate cancer Neg Hx   . Bladder Cancer Neg Hx   . Kidney cancer Neg Hx     Review of Systems: A 12-system review of systems was performed and was negative except as noted in the HPI.  --------------------------------------------------------------------------------------------------  Physical Exam: BP (!) 140/50 (BP Location: Left Arm, Patient Position: Sitting, Cuff Size: Normal)   Pulse 66   Ht 5\' 11"  (1.803 m)   Wt 216 lb (98 kg)   BMI 30.13 kg/m   General: NAD. HEENT: No conjunctival pallor or scleral icterus. Facemask in place. Neck: Supple without lymphadenopathy, thyromegaly, JVD, or HJR. Lungs: Normal work of breathing. Clear to auscultation bilaterally without wheezes or crackles. Heart: Regular rate and rhythm without murmurs, rubs, or gallops. Abd: Bowel sounds present. Soft, NT/ND without hepatosplenomegaly Ext: 1+ pretibial  edema, L>R Skin: Warm and dry.  Healing abrasions overlie the right shin.  EKG: Normal sinus rhythm with first-degree AV block (PR interval 250 ms), PVCs, incomplete right bundle branch block, and left anterior fascicular block.  No significant change from prior tracing on 03/08/2019.  Lab Results  Component Value Date   WBC 7.7 08/09/2018   HGB 15.5 08/09/2018   HCT 45.8 08/09/2018   MCV 88 08/09/2018   PLT 253 08/09/2018    Lab Results  Component Value Date   NA 141 08/09/2018   K 3.9 08/09/2018   CL 102 08/09/2018   CO2 26 08/09/2018   BUN 15 08/09/2018   CREATININE 0.95 08/09/2018   GLUCOSE 146 (H) 08/09/2018   ALT 22 04/10/2015    Lab Results  Component Value Date   CHOL 109 07/28/2018   HDL 38 (L) 07/28/2018   LDLCALC 61 07/28/2018   TRIG 48 07/28/2018   CHOLHDL 2.9 07/28/2018   --------------------------------------------------------------------------------------------------  ASSESSMENT AND PLAN: Coronary artery disease: Mr. Saephan is doing well without recurrent angina despite discontinuation of isosorbide mononitrate.  Continue to work on lifestyle modifications; LDL was excellent on the last check in 07/2019 at 63.  Paroxysmal atrial fibrillation: We will have any clear evidence of atrial fibrillation on prior EKGs.  However, patient has  been maintained on apixaban for history of PAF by Dr. Ouida Sills.  As he is tolerating the medication well, I will defer changes today.  PSVT and PVCs: EKG today shows occasional PVCs.  Mr. Ruan notes skipped beats, which may represent PVCs.  He was previously on metoprolol, but this was discontinued in the setting of bradycardia and first-degree AV block.  Given minimal symptoms, I think it is reasonable to defer rechallenging with metoprolol or another agent.  Chronic HFpEF: Mr. Bennis has stable lower extremity edema and occasional shortness of breath that is not exertional.  He also denies orthopnea.  It is reasonable to  continue furosemide 40 mg daily.  Follow-up: Return to clinic in 6 months.  Nelva Bush, MD 09/08/2019 8:39 AM

## 2019-09-26 ENCOUNTER — Other Ambulatory Visit: Payer: Self-pay

## 2019-09-26 ENCOUNTER — Encounter: Payer: Self-pay | Admitting: Dermatology

## 2019-09-26 ENCOUNTER — Ambulatory Visit (INDEPENDENT_AMBULATORY_CARE_PROVIDER_SITE_OTHER): Payer: Medicare Other | Admitting: Dermatology

## 2019-09-26 DIAGNOSIS — I2510 Atherosclerotic heart disease of native coronary artery without angina pectoris: Secondary | ICD-10-CM | POA: Diagnosis not present

## 2019-09-26 DIAGNOSIS — L578 Other skin changes due to chronic exposure to nonionizing radiation: Secondary | ICD-10-CM | POA: Diagnosis not present

## 2019-09-26 DIAGNOSIS — L720 Epidermal cyst: Secondary | ICD-10-CM

## 2019-09-26 DIAGNOSIS — L570 Actinic keratosis: Secondary | ICD-10-CM

## 2019-09-26 DIAGNOSIS — L821 Other seborrheic keratosis: Secondary | ICD-10-CM

## 2019-09-26 DIAGNOSIS — Z86006 Personal history of melanoma in-situ: Secondary | ICD-10-CM | POA: Diagnosis not present

## 2019-09-26 DIAGNOSIS — Z85828 Personal history of other malignant neoplasm of skin: Secondary | ICD-10-CM

## 2019-09-26 DIAGNOSIS — L820 Inflamed seborrheic keratosis: Secondary | ICD-10-CM

## 2019-09-26 DIAGNOSIS — L738 Other specified follicular disorders: Secondary | ICD-10-CM

## 2019-09-26 NOTE — Progress Notes (Signed)
Follow-Up Visit   Subjective  Jordan Figueroa is a 86 y.o. adult who presents for the following: Follow-up (AKs). Patient hasn't noticed any new or changing spots. He has a h/o MIS on scalp 2014.   The following portions of the chart were reviewed this encounter and updated as appropriate:      Review of Systems:  No other skin or systemic complaints except as noted in HPI or Assessment and Plan.  Objective  Well appearing patient in no apparent distress; mood and affect are within normal limits.  A focused examination was performed including face, scalp. Relevant physical exam findings are noted in the Assessment and Plan.  Objective  Crown Scalp: Well healed scar with no evidence of recurrence.   Objective  L crown of scalp within scar x 1, Right Forearm x 6, Right Upper Arm x 2, Left Forearm x 5 (13): Erythematous keratotic or waxy stuck-on papule   Objective  R frontal scalp x 1, Mid forehead x 1, L frontal scalp x 1, L post parietal x 5, R upper occipital x 3, R parietal x 1, R preauricular x 1, Occipital scalp x 1 (14): Pink keratotic macules with gritty scale   Objective  Left Preauricular: 4.0 mm white papule with central dell.   Assessment & Plan   Actinic Damage - diffuse scaly erythematous macules with underlying dyspigmentation - Recommend daily broad spectrum sunscreen SPF 30+ to sun-exposed areas, reapply every 2 hours as needed.  - Call for new or changing lesions.  History of Squamous Cell Carcinoma of the Skin - No evidence of recurrence today - Recommend regular full body skin exams - Recommend daily broad spectrum sunscreen SPF 30+ to sun-exposed areas, reapply every 2 hours as needed.  - Call if any new or changing lesions are noted between office visits  History of melanoma in situ Crown Scalp  Clear. Observe for recurrence. Call clinic for new or changing lesions.  Recommend regular skin exams, daily broad-spectrum spf 30+ sunscreen use, and  photoprotection.     Inflamed seborrheic keratosis (14) Right Forearm x 6, Right Upper Arm x 2, Left Forearm x 5 (13); L crown of scalp within scar x 1  Destruction of lesion - L crown of scalp within scar x 1, Right Forearm x 6, Right Upper Arm x 2, Left Forearm x 5  Destruction method: cryotherapy   Informed consent: discussed and consent obtained   Lesion destroyed using liquid nitrogen: Yes   Region frozen until ice ball extended beyond lesion: Yes   Outcome: patient tolerated procedure well with no complications   Post-procedure details: wound care instructions given    AK (actinic keratosis) (14) R frontal scalp x 1, Mid forehead x 1, L frontal scalp x 1, L post parietal x 5, R upper occipital x 3, R parietal x 1, R preauricular x 1, Occipital scalp x 1  vs ISKs  Destruction of lesion - R frontal scalp x 1, Mid forehead x 1, L frontal scalp x 1, L post parietal x 5, R upper occipital x 3, R parietal x 1, R preauricular x 1, Occipital scalp x 1  Destruction method: cryotherapy   Informed consent: discussed and consent obtained   Lesion destroyed using liquid nitrogen: Yes   Region frozen until ice ball extended beyond lesion: Yes   Outcome: patient tolerated procedure well with no complications   Post-procedure details: wound care instructions given    Epidermal inclusion cyst Left Preauricular  vs Sebaceous  Hyperplasia  Benign, observe. Recheck on follow-up.   Seborrheic Keratoses - Stuck-on, waxy, tan-brown papules and plaques  - Discussed benign etiology and prognosis. May try Eucerin Roughness Relief Spot Treatment or Gold Bond Rough & Bumpy. - Observe - Call for any changes  Sebaceous Hyperplasia - Small yellow papules with a central dell face - Benign - Observe   Return in about 6 months (around 03/25/2020) for UBSE, Recheck L preauricular Cyst vs SH, AKs scalp.   IJamesetta Orleans, CMA, am acting as scribe for Brendolyn Patty, MD .  Documentation: I have  reviewed the above documentation for accuracy and completeness, and I agree with the above.  Brendolyn Patty MD

## 2019-09-26 NOTE — Patient Instructions (Signed)
Cryotherapy Aftercare  . Wash gently with soap and water everyday.   . Apply Vaseline and Band-Aid daily until healed.  

## 2019-09-28 ENCOUNTER — Other Ambulatory Visit: Payer: Self-pay | Admitting: Internal Medicine

## 2019-10-29 ENCOUNTER — Other Ambulatory Visit: Payer: Self-pay | Admitting: Internal Medicine

## 2019-10-30 ENCOUNTER — Telehealth: Payer: Self-pay

## 2019-10-30 NOTE — Telephone Encounter (Signed)
Please call to discuss if patient should continue to take Isosorbide, if so, patient will need a refill

## 2019-10-30 NOTE — Telephone Encounter (Signed)
Spoke to patient and he reports he stopped taking the isosorbide. I am having trouble finding where it was discontinued and patient reports he does not remember anyone telling him to stop it.   Dr End please review. You saw him last 09/08/19 and isosorbide is not on the listed.

## 2019-11-01 NOTE — Telephone Encounter (Signed)
Patient made aware and was very pleased to hear he did not have to take the medication anymore.

## 2019-11-01 NOTE — Telephone Encounter (Signed)
Medication was stopped a few months ago. Okay to refrain from refilling it at this time, as Mr. Ardis was doing well without recurrent angina at our last visit.  Nelva Bush, MD [facility]

## 2019-11-14 ENCOUNTER — Other Ambulatory Visit: Payer: Self-pay

## 2019-11-14 ENCOUNTER — Ambulatory Visit (INDEPENDENT_AMBULATORY_CARE_PROVIDER_SITE_OTHER): Payer: Medicare Other | Admitting: Podiatry

## 2019-11-14 DIAGNOSIS — B351 Tinea unguium: Secondary | ICD-10-CM

## 2019-11-14 DIAGNOSIS — L989 Disorder of the skin and subcutaneous tissue, unspecified: Secondary | ICD-10-CM

## 2019-11-14 DIAGNOSIS — M79675 Pain in left toe(s): Secondary | ICD-10-CM

## 2019-11-14 DIAGNOSIS — M79674 Pain in right toe(s): Secondary | ICD-10-CM

## 2019-11-14 NOTE — Progress Notes (Signed)
SUBJECTIVE Patient presents to office today complaining of elongated, thickened nails that cause pain while ambulating in shoes.  He is unable to trim his own nails.  Patient also states that he developed symptomatic calluses to the bilateral forefoot.  He is even had surgery in the past to help resolve this issue which was unsuccessful.  He would like to have the calluses trimmed today.  Patient is here for further evaluation and treatment.  Past Medical History:  Diagnosis Date  . Arthritis    osteo-right hip  . Cancer ([place])    skin  . Colon polyps   . Dyspnea on exertion    a. 03/2015 Echo: Ef 60-65%, Gr1 DD, mild MR, nl RV fxn, mild PAH; b. 06/2015 RHC: minimal elevated PCWP; c.  04/2017 Echo: EF 60-65%, no rwma, GR1 DD, triv AI, mild MR, mildly dil LA, nl RV fxn, nl PASP.  Marland Kitchen Dysrhythmia   . GERD (gastroesophageal reflux disease)   . Hemorrhoids   . Hiatal hernia   . HOH (hard of hearing)    aides  . Hypertension   . IBS (irritable bowel syndrome)   . Melanoma ([place]) 09/26/2012   Crown scalp. MM in situ.   . Non-obstructive CAD (coronary artery disease)    a. 06/2015 Cath: LM 5, LAD 5ost, 30p, 33m, LCX nl, OM1 min irregs, OM2 nl, RCA 20p, 57m, RPDA/RPL1 min irregs, RPAV nl; b. 06/2017 Lexiscan MV: EF 55-65%, low risk.  Marland Kitchen PAF (paroxysmal atrial fibrillation) ([place])    a. Reported - no documentation in our records. Never seen on monitoring.  . Paroxysmal supraventricular tachycardia [facility]) March 2017   First documented during episode of influenza  . Pulmonary embolism ([place])    a. Chronic eliquis.  Marland Kitchen PVC's (premature ventricular contractions)    a. 06/2017 24h Holter: Avg HR 67 (49-110), rare PACs up to 5 beat run, freq PVC's - 8% burden; b. 08/2017 Zio: Avg HR 64 (48-171), occas PACs & PVCs. 4 episodes of NSVT (4 beats longest). 48 episodes of SVT up to 12.4 secs, max rate 171.  Marland Kitchen Squamous cell carcinoma of skin 04/20/2014   Right 3rd finger. WD SCC.     OBJECTIVE General Patient is  awake, alert, and oriented x 3 and in no acute distress. Derm Skin is dry and supple bilateral. Negative open lesions or macerations. Remaining integument unremarkable. Nails are tender, long, thickened and dystrophic with subungual debris, consistent with onychomycosis, 1-5 bilateral. No signs of infection noted.  Hyperkeratotic preulcerative callus tissue also noted to the bilateral forefoot x4 Vasc  DP and PT pedal pulses palpable bilaterally. Temperature gradient within normal limits.  Neuro Epicritic and protective threshold sensation grossly intact bilaterally.  Musculoskeletal Exam No symptomatic pedal deformities noted bilateral. Muscular strength within normal limits.  ASSESSMENT 1.  Pain due to onychomycosis of toenail both 2.  Preulcerative symptomatic callus lesions bilateral feet 3. Pain in foot bilateral  PLAN OF CARE 1. Patient evaluated today.  2. Instructed to maintain good pedal hygiene and foot care.  3. Mechanical debridement of nails 1-5 bilaterally performed using a nail nipper. Filed with dremel without incident.  4.  Excisional debridement of the hyperkeratotic preulcerative callus tissue was performed using a chisel blade without incident or bleeding  5.  Return to clinic in 3 mos.    Edrick Kins, DPM [facility]  Dr. Edrick Kins, DPM    [address]  [place], [place] [address]                Office [phone]  Fax [phone]

## 2019-12-29 ENCOUNTER — Encounter: Payer: Self-pay | Admitting: Urology

## 2019-12-29 ENCOUNTER — Ambulatory Visit (INDEPENDENT_AMBULATORY_CARE_PROVIDER_SITE_OTHER): Payer: Medicare Other | Admitting: Urology

## 2019-12-29 ENCOUNTER — Other Ambulatory Visit: Payer: Self-pay

## 2019-12-29 VITALS — BP 124/70 | HR 70 | Ht 71.0 in | Wt 216.0 lb

## 2019-12-29 DIAGNOSIS — N401 Enlarged prostate with lower urinary tract symptoms: Secondary | ICD-10-CM

## 2019-12-29 DIAGNOSIS — N138 Other obstructive and reflux uropathy: Secondary | ICD-10-CM | POA: Diagnosis not present

## 2019-12-29 LAB — BLADDER SCAN AMB NON-IMAGING: Scan Result: 0

## 2019-12-29 MED ORDER — FINASTERIDE 5 MG PO TABS: 5.0000 mg | ORAL_TABLET | Freq: Every day | ORAL | 3 refills | Status: DC

## 2019-12-29 NOTE — Progress Notes (Signed)
12/29/2019 8:55 AM   Jordan Figueroa Nov 21, 1927 [id number]  Referring provider: Kirk Ruths, MD [facility] [place],  [place] [address]  Chief Complaint  Patient presents with  . Benign Prostatic Hypertrophy     Urologic history: 1.BPH with lower urinary tract symptoms -Finasteride 5 mg daily  2.Elevated PSA -Biopsy 2006 PSA 6.2 with benign pathology -Discontinued PSA testing 2015(uncorrected PSA 1.5)  HPI: 86 y.o. male presents for annual follow-up.   Has done well since last years visit  No bothersome lower urinary tract symptoms  IPSS 6/35  Remains on finasteride  Denies dysuria, gross hematuria  No flank, abdominal or pelvic pain   PMH: Past Medical History:  Diagnosis Date  . Arthritis    osteo-right hip  . Cancer ([place])    skin  . Colon polyps   . Dyspnea on exertion    a. 03/2015 Echo: Ef 60-65%, Gr1 DD, mild MR, nl RV fxn, mild PAH; b. 06/2015 RHC: minimal elevated PCWP; c.  04/2017 Echo: EF 60-65%, no rwma, GR1 DD, triv AI, mild MR, mildly dil LA, nl RV fxn, nl PASP.  Jordan Figueroa Dysrhythmia   . GERD (gastroesophageal reflux disease)   . Hemorrhoids   . Hiatal hernia   . HOH (hard of hearing)    aides  . Hypertension   . IBS (irritable bowel syndrome)   . Melanoma ([place]) 09/26/2012   Crown scalp. MM in situ.   . Non-obstructive CAD (coronary artery disease)    a. 06/2015 Cath: LM 5, LAD 5ost, 30p, 61m, LCX nl, OM1 min irregs, OM2 nl, RCA 20p, 75m, RPDA/RPL1 min irregs, RPAV nl; b. 06/2017 Lexiscan MV: EF 55-65%, low risk.  Jordan Figueroa PAF (paroxysmal atrial fibrillation) ([place])    a. Reported - no documentation in our records. Never seen on monitoring.  . Paroxysmal supraventricular tachycardia [facility]) March 2017   First documented during episode of influenza  . Pulmonary embolism ([place] Fig[place])    a. Chronic eliquis.  Jordan Figueroa PVC's (premature ventricular contractions)    a. 06/2017 24h Holter: Avg HR 67 (49-110), rare PACs up to 5 beat  run, freq PVC's - 8% burden; b. 08/2017 Zio: Avg HR 64 (48-171), occas PACs & PVCs. 4 episodes of NSVT (4 beats longest). 48 episodes of SVT up to 12.4 secs, max rate 171.  Jordan Figueroa Squamous cell carcinoma of skin 04/20/2014   Right 3rd finger. WD SCC.     Surgical History: Past Surgical History:  Procedure Laterality Date  . CARDIAC CATHETERIZATION N/A 06/28/2015   Procedure: Left Heart Cath and Coronary Angiography;  Surgeon: Leonie Man, MD;  Location: [place] CV LAB;  Service: Cardiovascular;  Laterality: N/A;; proximal and mid RCA focal 20-25% lesions. Mild diffuse calcifications in the left main and proximal LAD ~30%, LVEDP 18 mmHg  . CARDIAC CATHETERIZATION  06/28/2015   Procedure: Right Heart Cath;  Surgeon: Leonie Man, MD;  Location: [place] CV LAB;  Service: Cardiovascular;; essentially normal right heart cath pressures. Cardiac Output 4.74.Normal wedge pressure roughly 12 mmHg.   Jordan Figueroa COLON SURGERY  07/14/2013   small bowel obstruction  . ESOPHAGOGASTRODUODENOSCOPY (EGD) WITH PROPOFOL N/A 08/26/2018   Procedure: ESOPHAGOGASTRODUODENOSCOPY (EGD) WITH PROPOFOL;  Surgeon: Jonathon Bellows, MD;  Location[facility] ENDOSCOPY;  Service: Gastroenterology;  Laterality: N/A;  . EYE SURGERY Bilateral    cataracts  . HEMORROIDECTOMY    . MASS EXCISION Left 03/03/2019   Procedure: EXCISION MASS;  Surgeon: [name], DO;  Location: [facility]  ORS;  Service: General;  Laterality: Left;  . SKIN SURGERY    . TRANSTHORACIC ECHOCARDIOGRAM  04/10/2015   EF 60-65%. No RWMA, GR 1 DD. Mild PA pressure elevation of 38 mmHg.    Home Medications:  Allergies as of 12/29/2019   No Known Allergies     Medication List       Accurate as of December 29, 2019  8:55 AM. If you have any questions, ask your nurse or doctor.        apixaban 5 MG Tabs tablet Commonly known as: Eliquis Take 1 tablet (5 mg total) by mouth 2 (two) times daily.   finasteride 5 MG tablet Commonly known as: PROSCAR Take 1 tablet (5  mg total) by mouth daily.   furosemide 40 MG tablet Commonly known as: LASIX TAKE 1 TABLET BY MOUTH EVERY DAY   gabapentin 100 MG capsule Commonly known as: NEURONTIN Take 100 mg by mouth 3 (three) times daily.   nitroGLYCERIN 0.4 MG SL tablet Commonly known as: Nitrostat Place 1 tablet (0.4 mg total) under the tongue every 5 (five) minutes as needed for chest pain. Maximum of 3 doses.       Allergies: No Known Allergies  Family History: Family History  Problem Relation Age of Onset  . CVA Father   . CAD Father   . Alzheimer's disease Mother   . Heart Problems Sister   . Heart attack Brother   . CAD Brother   . Prostate cancer Neg Hx   . Bladder Cancer Neg Hx   . Kidney cancer Neg Hx     Social History:  reports that he quit smoking about 46 years ago. His smoking use included cigarettes. He has a 50.00 pack-year smoking history. He has never used smokeless tobacco. He reports that he does not drink alcohol and does not use drugs.   Physical Exam: There were no vitals taken for this visit.  Constitutional:  Alert and oriented, No acute distress. HEENT: [place] AT, moist mucus membranes.  Trachea midline, no masses. Cardiovascular: No clubbing, cyanosis, or edema. Respiratory: Normal respiratory effort, no increased work of breathing. Psychiatric: Normal mood and affect.   Assessment & Plan:    1. BPH with obstruction/lower urinary tract symptoms  Stable lower urinary tract symptoms on finasteride  Refill sent to pharmacy  Bladder scan PVR 0 mL  Continue annual follow-up   [name], MD  [facility] [address], [place], [place] [address] [phone]

## 2020-02-09 ENCOUNTER — Encounter: Payer: Self-pay | Admitting: Podiatry

## 2020-02-09 ENCOUNTER — Other Ambulatory Visit: Payer: Self-pay

## 2020-02-09 ENCOUNTER — Ambulatory Visit (INDEPENDENT_AMBULATORY_CARE_PROVIDER_SITE_OTHER): Payer: Medicare Other | Admitting: Podiatry

## 2020-02-09 DIAGNOSIS — M79675 Pain in left toe(s): Secondary | ICD-10-CM | POA: Diagnosis not present

## 2020-02-09 DIAGNOSIS — B351 Tinea unguium: Secondary | ICD-10-CM | POA: Diagnosis not present

## 2020-02-09 DIAGNOSIS — M79674 Pain in right toe(s): Secondary | ICD-10-CM | POA: Diagnosis not present

## 2020-02-09 DIAGNOSIS — L989 Disorder of the skin and subcutaneous tissue, unspecified: Secondary | ICD-10-CM

## 2020-02-09 NOTE — Progress Notes (Signed)
 SUBJECTIVE Patient presents to office today complaining of elongated, thickened nails that cause pain while ambulating in shoes.  He is unable to trim his own nails.  Patient also states that he developed symptomatic calluses to the bilateral forefoot.  He is even had surgery in the past to help resolve this issue which was unsuccessful.  He would like to have the calluses trimmed today.  Patient is here for further evaluation and treatment.  Past Medical History:  Diagnosis Date  . Arthritis    osteo-right hip  . Cancer (HCC)    skin  . Colon polyps   . Dyspnea on exertion    a. 03/2015 Echo: Ef 60-65%, Gr1 DD, mild MR, nl RV fxn, mild PAH; b. 06/2015 RHC: minimal elevated PCWP; c.  04/2017 Echo: EF 60-65%, no rwma, GR1 DD, triv AI, mild MR, mildly dil LA, nl RV fxn, nl PASP.  . Dysrhythmia   . GERD (gastroesophageal reflux disease)   . Hemorrhoids   . Hiatal hernia   . HOH (hard of hearing)    aides  . Hypertension   . IBS (irritable bowel syndrome)   . Melanoma (HCC) 09/26/2012   Crown scalp. MM in situ.   . Non-obstructive CAD (coronary artery disease)    a. 06/2015 Cath: LM 5, LAD 5ost, 30p, 10m, LCX nl, OM1 min irregs, OM2 nl, RCA 20p, 25m, RPDA/RPL1 min irregs, RPAV nl; b. 06/2017 Lexiscan MV: EF 55-65%, low risk.  . PAF (paroxysmal atrial fibrillation) (HCC)    a. Reported - no documentation in our records. Never seen on monitoring.  . Paroxysmal supraventricular tachycardia (HCC) March 2017   First documented during episode of influenza  . Pulmonary embolism (HCC)    a. Chronic eliquis.  . PVC's (premature ventricular contractions)    a. 06/2017 24h Holter: Avg HR 67 (49-110), rare PACs up to 5 beat run, freq PVC's - 8% burden; b. 08/2017 Zio: Avg HR 64 (48-171), occas PACs & PVCs. 4 episodes of NSVT (4 beats longest). 48 episodes of SVT up to 12.4 secs, max rate 171.  . Squamous cell carcinoma of skin 04/20/2014   Right 3rd finger. WD SCC.     OBJECTIVE General Patient is  awake, alert, and oriented x 3 and in no acute distress. Derm Skin is dry and supple bilateral. Negative open lesions or macerations. Remaining integument unremarkable. Nails are tender, long, thickened and dystrophic with subungual debris, consistent with onychomycosis, 1-5 bilateral. No signs of infection noted.  Hyperkeratotic preulcerative callus tissue also noted to the bilateral forefoot x4 Vasc  DP and PT pedal pulses palpable bilaterally. Temperature gradient within normal limits.  Neuro Epicritic and protective threshold sensation grossly intact bilaterally.  Musculoskeletal Exam No symptomatic pedal deformities noted bilateral. Muscular strength within normal limits.  ASSESSMENT 1.  Pain due to onychomycosis of toenail both 2.  Preulcerative symptomatic callus lesions bilateral feet 3. Pain in foot bilateral  PLAN OF CARE 1. Patient evaluated today.  2. Instructed to maintain good pedal hygiene and foot care.  3. Mechanical debridement of nails 1-5 bilaterally performed using a nail nipper. Filed with dremel without incident.  4.  Excisional debridement of the hyperkeratotic preulcerative callus tissue was performed using a chisel blade without incident or bleeding  5.  Return to clinic in 3 mos.    Brent M. Evans, DPM [facility]  Dr. Brent M. Evans, DPM    [address]                                      [place], [place] [address]                Office [phone]  Fax [phone]

## 2020-02-22 IMAGING — DX PORTABLE CHEST - 1 VIEW
1 series · 1 of 1 positions shown · non-contrast
Comparison: December 04, 2015

CLINICAL DATA: Substernal chest pain.

EXAM:
PORTABLE CHEST 1 VIEW

[chest ap]
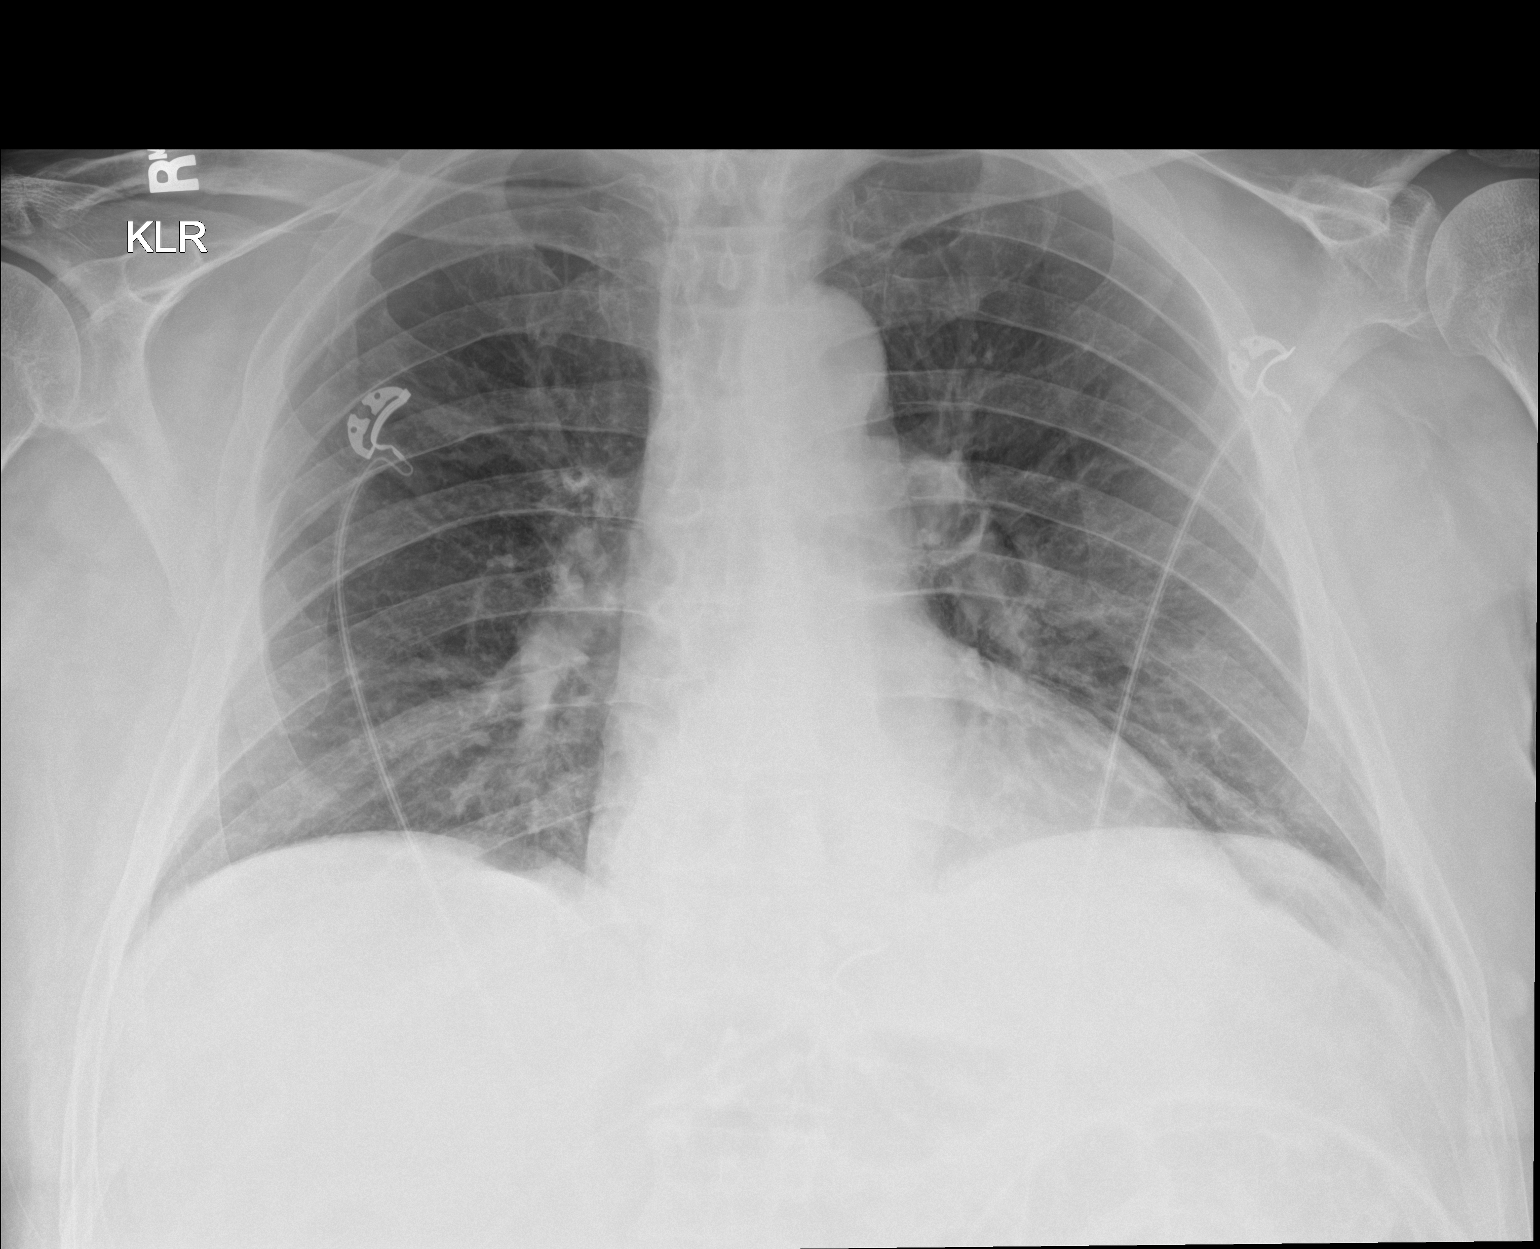

[1 of 1 positions shown; findings below may reference images not displayed]

FINDINGS: Cardiomediastinal silhouette is normal. Mediastinal contours appear
intact.

There is no evidence of focal airspace consolidation, pleural
effusion or pneumothorax.

Osseous structures are without acute abnormality. Soft tissues are
grossly normal.
IMPRESSION: No active disease.

## 2020-03-11 NOTE — Progress Notes (Signed)
Follow-up Outpatient Visit Date: 03/13/2020  Primary Care Provider: Kirk Ruths, MD [place] [address]  Chief Complaint: Follow-up CAD and atrial arrhythmias  HPI:  Mr. Okane is a 87 y.o. male with history of non-obstructivecoronary artery disease, pulmonary embolism, paroxysmal SVT, questionable paroxysmal atrial fibrillation, hiatal hernia, IBS, and GERD, who presents for follow-up of CAD and PAF.  I last saw him in 08/2019, at which time Mr. Noxon was doing well.  He noted occasional transient shortness of breath when reading as well as chronic lower extremity edema.  He had self-discontinued isosorbide mononitrate due to lack of recurrent chest pain.  No medication changes or additional testing were pursued.  Today, Mr. Ostlund reports he has been feeling relatively well.  His biggest concern is occasional stiffness and pain in his shoulders, especially at night when in bed.  This improves when he moves his arms.  He notes occasional aching in the left side of his chest.  He says it is very mild and usually lasts less than 30 minutes.  It is not related to any particular activity and only happens every 6 to 8 weeks.  He has not had any shortness of breath or palpitations.  Chronic ankle edema is stable.  He notes occasional orthostatic lightheadedness without syncope.  He has fallen a few times over the last year, most recently about 4 weeks ago, without any significant injuries.  He remains compliant with his medications including apixaban.  --------------------------------------------------------------------------------------------------  Cardiovascular History & Procedures: Cardiovascular Problems:  Non-obstructive CAD  ? Paroxysmal atrial fibrillation  Paroxysmal SVT  Pulmonary embolism  Risk Factors:  Known CAD, age, and male gender  Cath/PCI:  LHC/RHC (06/28/15):Mild diffuse disease involving the left coronary  artery. 20-25% proximal and mid/distal RCA lesions. Mildly elevated left heart filling pressure. Normal right heart filling and pulmonary artery pressures. Normal LVEF with mildly reduced Fick cardiac output/index.  CV Surgery:  None  EP Procedures and Devices:  Event monitor (08/24/2017): Predominantly sinus rhythm with occasional PACs and PVCs. 48 episodes of PSVT up to 12.4 seconds.  24-hour Holter monitor (06/22/2017): This rhythm with frequent PVCs (8% burden) and rare PACs. Single brief atrial run noted.  Non-Invasive Evaluation(s):  Transthoracic echocardiogram (07/28/2018): Normal LV size and wall thickness. LVEF 60-65% with grade 1 diastolic dysfunction. Normal RV size and function. No significant valvular abnormality.  Pharmacologic MPI (04/12/2018): Low risk study without ischemia or scar. LVEF calculated at 40%, likely artifactually low due to GI uptake.  Transthoracic echocardiogram(04/29/2017): Normal LV size and LVEF (60 to 65%) with grade 1 diastolic dysfunction. Trivial AI and mild MR. Mild left atrial enlargement. Normal RV contraction.  Exercise myocardial perfusion stress test (06/07/15): Low risk scan without evidence of ischemia. Apical thinning noted. LVEF reduced at 40%, a likely artifactual.  Transthoracic echocardiogram (04/10/15): Normal LV size and function with LVEF is 60-65%. Grade 1 diastolic dysfunction. Mild mitral regurgitation. Normal RV size and function. Mild pulmonary hypertension.  Recent CV Pertinent Labs: Lab Results  Component Value Date   CHOL 109 07/28/2018   HDL 38 (L) 07/28/2018   LDLCALC 61 07/28/2018   TRIG 48 07/28/2018   CHOLHDL 2.9 07/28/2018   INR 1.23 06/26/2015   BNP 87.0 04/10/2015   K 3.9 08/09/2018   K 4.1 07/17/2013   MG 2.3 08/09/2018   BUN 15 08/09/2018   BUN 9 07/17/2013   CREATININE 0.95 08/09/2018   CREATININE 0.92 07/17/2013  Past medical and surgical history were reviewed and updated in  EPIC.  Current Meds  Medication Sig  . apixaban (ELIQUIS) 5 MG TABS tablet Take 1 tablet (5 mg total) by mouth 2 (two) times daily.  . finasteride (PROSCAR) 5 MG tablet Take 1 tablet (5 mg total) by mouth daily.  . furosemide (LASIX) 40 MG tablet TAKE 1 TABLET BY MOUTH EVERY DAY  . gabapentin (NEURONTIN) 100 MG capsule Take 100 mg by mouth 3 (three) times daily.  . nitroGLYCERIN (NITROSTAT) 0.4 MG SL tablet Place 1 tablet (0.4 mg total) under the tongue every 5 (five) minutes as needed for chest pain. Maximum of 3 doses.    Allergies: Patient has no known allergies.  Social History   Tobacco Use  . Smoking status: Former Smoker    Packs/day: 2.00    Years: 25.00    Pack years: 50.00    Types: Cigarettes    Quit date: 02/25/1973    Years since quitting: 47.0  . Smokeless tobacco: Never Used  Vaping Use  . Vaping Use: Never used  Substance Use Topics  . Alcohol use: No    Alcohol/week: 0.0 standard drinks  . Drug use: Never    Family History  Problem Relation Age of Onset  . CVA Father   . CAD Father   . Alzheimer's disease Mother   . Heart Problems Sister   . Heart attack Brother   . CAD Brother   . Prostate cancer Neg Hx   . Bladder Cancer Neg Hx   . Kidney cancer Neg Hx     Review of Systems: A 12-system review of systems was performed and was negative except as noted in the HPI.  --------------------------------------------------------------------------------------------------  Physical Exam: BP 120/68 (BP Location: Left Arm, Patient Position: Sitting, Cuff Size: Normal)   Pulse 75   Ht 5\' 11"  (1.803 m)   Wt 223 lb 2 oz (101.2 kg)   SpO2 98%   BMI 31.12 kg/m   General:  NAD. Neck: No JVD or HJR. Lungs: Clear to auscultation bilaterally without wheezes or crackles. Heart: Regular rate and rhythm with occasional extrasystoles.  No murmurs, rubs, or gallops. Abdomen: Soft, nontender, nondistended. Extremities:Trace pretibial edema, L>R  EKG: Normal  sinus rhythm with first-degree AV block, PACs, and PVCs, left axis deviation, and incomplete RBBB.  No significant change from prior tracing on 09/08/2019.  Lab Results  Component Value Date   WBC 7.7 08/09/2018   HGB 15.5 08/09/2018   HCT 45.8 08/09/2018   MCV 88 08/09/2018   PLT 253 08/09/2018    Lab Results  Component Value Date   NA 141 08/09/2018   K 3.9 08/09/2018   CL 102 08/09/2018   CO2 26 08/09/2018   BUN 15 08/09/2018   CREATININE 0.95 08/09/2018   GLUCOSE 146 (H) 08/09/2018   ALT 22 04/10/2015    Lab Results  Component Value Date   CHOL 109 07/28/2018   HDL 38 (L) 07/28/2018   LDLCALC 61 07/28/2018   TRIG 48 07/28/2018   CHOLHDL 2.9 07/28/2018    --------------------------------------------------------------------------------------------------  ASSESSMENT AND PLAN: Coronary artery disease with stable angina: Overall, Mr. Alberico reports feeling well with rare episodes of mild left-sided chest discomfort that is not exertional.  In the past, he noticed some improvement with isosorbide mononitrate.  However, he does not wish to restart this today given infrequent nature of his symptoms.  We will defer adding a beta-blocker given history of first-degree AV block and significant bradycardia  in the past while on metoprolol.  Continue apixaban in lieu of aspirin given history of PE and concern for atrial fibrillation noted in Dr. Tonette Bihari prior notes.  PSVT and PAF: EKG today shows sinus rhythm with PACs and PVCs, similar to prior tracing.  We have not seen any clear evidence of atrial fibrillation in our office, though apixaban has been continued under the direction of Dr. Ouida Sills.  I will defer long-term anticoagulation to him.  However, in the setting of occasional falls, the risk benefit ratio of long-term anticoagulation is becoming less favorable.  CBC and CMP with Dr. Ouida Sills in 01/2020 was unremarkable.  Chronic HFpEF: Mr. Popp has stable longstanding ankle  edema but otherwise no other signs or symptoms of heart failure.  Continue furosemide 40 mg daily.  Follow-up: Return to clinic in 6 months.  Nelva Bush, MD 03/13/2020 8:22 AM

## 2020-03-13 ENCOUNTER — Ambulatory Visit (INDEPENDENT_AMBULATORY_CARE_PROVIDER_SITE_OTHER): Payer: Medicare Other | Admitting: Internal Medicine

## 2020-03-13 ENCOUNTER — Encounter: Payer: Self-pay | Admitting: Internal Medicine

## 2020-03-13 ENCOUNTER — Other Ambulatory Visit: Payer: Self-pay

## 2020-03-13 VITALS — BP 120/68 | HR 75 | Ht 71.0 in | Wt 223.1 lb

## 2020-03-13 DIAGNOSIS — I471 Supraventricular tachycardia, unspecified: Secondary | ICD-10-CM

## 2020-03-13 DIAGNOSIS — I480 Paroxysmal atrial fibrillation: Secondary | ICD-10-CM | POA: Diagnosis not present

## 2020-03-13 DIAGNOSIS — I5032 Chronic diastolic (congestive) heart failure: Secondary | ICD-10-CM

## 2020-03-13 DIAGNOSIS — I2510 Atherosclerotic heart disease of native coronary artery without angina pectoris: Secondary | ICD-10-CM | POA: Diagnosis not present

## 2020-03-13 NOTE — Patient Instructions (Signed)

## 2020-03-19 ENCOUNTER — Ambulatory Visit: Payer: Medicare Other | Admitting: Dermatology

## 2020-05-14 ENCOUNTER — Ambulatory Visit (INDEPENDENT_AMBULATORY_CARE_PROVIDER_SITE_OTHER): Payer: Medicare Other | Admitting: Podiatry

## 2020-05-14 ENCOUNTER — Other Ambulatory Visit: Payer: Self-pay

## 2020-05-14 DIAGNOSIS — L989 Disorder of the skin and subcutaneous tissue, unspecified: Secondary | ICD-10-CM | POA: Diagnosis not present

## 2020-05-14 DIAGNOSIS — M79675 Pain in left toe(s): Secondary | ICD-10-CM | POA: Diagnosis not present

## 2020-05-14 DIAGNOSIS — M79674 Pain in right toe(s): Secondary | ICD-10-CM

## 2020-05-14 DIAGNOSIS — B351 Tinea unguium: Secondary | ICD-10-CM | POA: Diagnosis not present

## 2020-05-14 NOTE — Progress Notes (Signed)
 SUBJECTIVE Patient presents to office today complaining of elongated, thickened nails that cause pain while ambulating in shoes.  He is unable to trim his own nails.  Patient also states that he developed symptomatic calluses to the bilateral forefoot.  He is even had surgery in the past to help resolve this issue which was unsuccessful.  He would like to have the calluses trimmed today.  Patient is here for further evaluation and treatment.  Past Medical History:  Diagnosis Date  . Arthritis    osteo-right hip  . Cancer (HCC)    skin  . Colon polyps   . Dyspnea on exertion    a. 03/2015 Echo: Ef 60-65%, Gr1 DD, mild MR, nl RV fxn, mild PAH; b. 06/2015 RHC: minimal elevated PCWP; c.  04/2017 Echo: EF 60-65%, no rwma, GR1 DD, triv AI, mild MR, mildly dil LA, nl RV fxn, nl PASP.  . Dysrhythmia   . GERD (gastroesophageal reflux disease)   . Hemorrhoids   . Hiatal hernia   . HOH (hard of hearing)    aides  . Hypertension   . IBS (irritable bowel syndrome)   . Melanoma (HCC) 09/26/2012   Crown scalp. MM in situ.   . Non-obstructive CAD (coronary artery disease)    a. 06/2015 Cath: LM 5, LAD 5ost, 30p, 10m, LCX nl, OM1 min irregs, OM2 nl, RCA 20p, 25m, RPDA/RPL1 min irregs, RPAV nl; b. 06/2017 Lexiscan MV: EF 55-65%, low risk.  . PAF (paroxysmal atrial fibrillation) (HCC)    a. Reported - no documentation in our records. Never seen on monitoring.  . Paroxysmal supraventricular tachycardia (HCC) March 2017   First documented during episode of influenza  . Pulmonary embolism (HCC)    a. Chronic eliquis.  . PVC's (premature ventricular contractions)    a. 06/2017 24h Holter: Avg HR 67 (49-110), rare PACs up to 5 beat run, freq PVC's - 8% burden; b. 08/2017 Zio: Avg HR 64 (48-171), occas PACs & PVCs. 4 episodes of NSVT (4 beats longest). 48 episodes of SVT up to 12.4 secs, max rate 171.  . Squamous cell carcinoma of skin 04/20/2014   Right 3rd finger. WD SCC.     OBJECTIVE General Patient is  awake, alert, and oriented x 3 and in no acute distress. Derm Skin is dry and supple bilateral. Negative open lesions or macerations. Remaining integument unremarkable. Nails are tender, long, thickened and dystrophic with subungual debris, consistent with onychomycosis, 1-5 bilateral. No signs of infection noted.  Hyperkeratotic preulcerative callus tissue also noted to the bilateral forefoot x4 Vasc  DP and PT pedal pulses palpable bilaterally. Temperature gradient within normal limits.  Neuro Epicritic and protective threshold sensation grossly intact bilaterally.  Musculoskeletal Exam No symptomatic pedal deformities noted bilateral. Muscular strength within normal limits.  ASSESSMENT 1.  Pain due to onychomycosis of toenail both 2.  Preulcerative symptomatic callus lesions bilateral feet 3. Pain in foot bilateral  PLAN OF CARE 1. Patient evaluated today.  2. Instructed to maintain good pedal hygiene and foot care.  3. Mechanical debridement of nails 1-5 bilaterally performed using a nail nipper. Filed with dremel without incident.  4.  Excisional debridement of the hyperkeratotic preulcerative callus tissue was performed using a chisel blade without incident or bleeding  5.  Return to clinic in 3 mos.    Kolson Chovanec M. Skyla Champagne, DPM [facility]  Dr. Jet Traynham M. Taurus Alamo, DPM    [address]                                      [place], [place] [address]                Office [phone]  Fax [phone]

## 2020-05-29 ENCOUNTER — Other Ambulatory Visit: Payer: Self-pay

## 2020-05-29 ENCOUNTER — Encounter: Payer: Self-pay | Admitting: Dermatology

## 2020-05-29 ENCOUNTER — Ambulatory Visit (INDEPENDENT_AMBULATORY_CARE_PROVIDER_SITE_OTHER): Payer: Medicare Other | Admitting: Dermatology

## 2020-05-29 DIAGNOSIS — L570 Actinic keratosis: Secondary | ICD-10-CM | POA: Diagnosis not present

## 2020-05-29 DIAGNOSIS — C44519 Basal cell carcinoma of skin of other part of trunk: Secondary | ICD-10-CM

## 2020-05-29 DIAGNOSIS — C44319 Basal cell carcinoma of skin of other parts of face: Secondary | ICD-10-CM

## 2020-05-29 DIAGNOSIS — L739 Follicular disorder, unspecified: Secondary | ICD-10-CM | POA: Diagnosis not present

## 2020-05-29 DIAGNOSIS — L821 Other seborrheic keratosis: Secondary | ICD-10-CM

## 2020-05-29 DIAGNOSIS — D492 Neoplasm of unspecified behavior of bone, soft tissue, and skin: Secondary | ICD-10-CM

## 2020-05-29 DIAGNOSIS — C4491 Basal cell carcinoma of skin, unspecified: Secondary | ICD-10-CM

## 2020-05-29 HISTORY — DX: Basal cell carcinoma of skin, unspecified: C44.91

## 2020-05-29 MED ORDER — DOXYCYCLINE HYCLATE 100 MG PO CAPS: 100.0000 mg | ORAL_CAPSULE | Freq: Two times a day (BID) | ORAL | 0 refills | Status: AC

## 2020-05-29 MED ORDER — MUPIROCIN 2 % EX OINT: 1.0000 "application " | TOPICAL_OINTMENT | Freq: Every day | CUTANEOUS | 0 refills | Status: DC

## 2020-05-29 NOTE — Progress Notes (Signed)
Follow-Up Visit   Subjective  Jordan Figueroa is a 87 y.o. male who presents for the following: lesion (Check spot at right forehead. Raised, growing. Irritated at times. Dur: 6 months. ) and Rash (C/O rash on scalp. Itching. Moderate. ).  The following portions of the chart were reviewed this encounter and updated as appropriate:  Tobacco  Allergies  Meds  Problems  Med Hx  Surg Hx  Fam Hx      Review of Systems: No other skin or systemic complaints except as noted in HPI or Assessment and Plan.  Objective  Well appearing patient in no apparent distress; mood and affect are within normal limits.  A focused examination was performed including face, scalp. left axilla. Relevant physical exam findings are noted in the Assessment and Plan.  Objective  Scalp: Scattered follicular based pustules  Objective  Right Forehead: 0.6cm pink and tan papule with prominent telangiectasias     Objective  Left Axilla: 0.9 cm pink plaque     Objective  Left vertex scalp x1: Erythematous  papule with thicker gritty scale.   Assessment & Plan  Folliculitis Scalp  Start Doxycycline 100mg  1 po bid with food for 14 days  Bacterial C&S performed today; results pending.   Doxycycline should be taken with food to prevent nausea. Do not lay down for 30 minutes after taking. Be cautious with sun exposure and use good sun protection while on this medication. Pregnant women should not take this medication.    doxycycline (VIBRAMYCIN) 100 MG capsule - Scalp  Other Related Procedures Anaerobic and Aerobic Culture  Neoplasm of skin (2) Right Forehead  Skin / nail biopsy Type of biopsy: tangential   Informed consent: discussed and consent obtained   Anesthesia: the lesion was anesthetized in a standard fashion   Anesthesia comment:  Area prepped with alcohol Anesthetic:  1% lidocaine w/ epinephrine 1-100,000 buffered w/ 8.4% NaHCO3 Instrument used: flexible razor blade    Hemostasis achieved with: pressure, aluminum chloride and electrodesiccation   Outcome: patient tolerated procedure well   Post-procedure details: wound care instructions given   Post-procedure details comment:  Ointment and small bandage applied  Specimen 1 - Surgical pathology Differential Diagnosis: BCC > MM  Check Margins: No 0.6cm pink and tan papule with prominent telangiectasias  Left Axilla  Skin / nail biopsy Type of biopsy: tangential   Informed consent: discussed and consent obtained   Anesthesia: the lesion was anesthetized in a standard fashion   Anesthesia comment:  Area prepped with alcohol Anesthetic:  1% lidocaine w/ epinephrine 1-100,000 buffered w/ 8.4% NaHCO3 Instrument used: flexible razor blade   Hemostasis achieved with: pressure, aluminum chloride and electrodesiccation   Outcome: patient tolerated procedure well   Post-procedure details: wound care instructions given   Post-procedure details comment:  Ointment and small bandage applied  mupirocin ointment (BACTROBAN) 2 %  Specimen 2 - Surgical pathology Differential Diagnosis: BCC > MM Check Margins: No 0.9 cm pink plaque  AK (actinic keratosis) Left vertex scalp x1  Hypertrophic AK  Prior to procedure, discussed risks of blister formation, small wound, skin dyspigmentation, or rare scar following cryotherapy.    Destruction of lesion - Left vertex scalp x1  Destruction method: cryotherapy   Informed consent: discussed and consent obtained   Lesion destroyed using liquid nitrogen: Yes   Outcome: patient tolerated procedure well with no complications   Post-procedure details: wound care instructions given     Other Procedures Placed This Encounter Specimen status report  Seborrheic Keratoses at scalp - Stuck-on, waxy, tan-brown papules and/or plaques  - Benign-appearing - Discussed benign etiology and prognosis. - Observe - Call for any changes  Return in about 6 weeks (around  07/10/2020) for Recheck AK.   I, Emelia Salisbury, CMA, am acting as scribe for Forest Gleason, MD.  Documentation: I have reviewed the above documentation for accuracy and completeness, and I agree with the above.  Forest Gleason, MD

## 2020-05-29 NOTE — Patient Instructions (Addendum)
Wound Care Instructions  1. Cleanse wound gently with soap and water once a day then pat dry with clean gauze. Apply a thing coat of Petrolatum (petroleum jelly, "Vaseline") over the wound (unless you have an allergy to this). We recommend that you use a new, sterile tube of Vaseline. Do not pick or remove scabs. Do not remove the yellow or white "healing tissue" from the base of the wound.  2. Cover the wound with fresh, clean, nonstick gauze and secure with paper tape. You may use Band-Aids in place of gauze and tape if the would is small enough, but would recommend trimming much of the tape off as there is often too much. Sometimes Band-Aids can irritate the skin.  3. You should call the office for your biopsy report after 1 week if you have not already been contacted.  4. If you experience any problems, such as abnormal amounts of bleeding, swelling, significant bruising, significant pain, or evidence of infection, please call the office immediately.  5. FOR ADULT SURGERY PATIENTS: If you need something for pain relief you may take 1 extra strength Tylenol (acetaminophen) AND 2 Ibuprofen (200mg  each) together every 4 hours as needed for pain. (do not take these if you are allergic to them or if you have a reason you should not take them.) Typically, you may only need pain medication for 1 to 3 days.    Doxycycline should be taken with food to prevent nausea. Do not lay down for 30 minutes after taking. Be cautious with sun exposure and use good sun protection while on this medication. Pregnant women should not take this medication.   Cryotherapy  Cryotherapy is the treatment of lesions with the application of a cold substance.  In most cases, liquid nitrogen is used to destroy the lesion(s).  Liquid nitrogen is so cold, -196 Celsius, it feels like it is burning when it is applied.  After treatment with liquid nitrogen, there may be some burning sensation or pain that can last up to 24 hours.   The area may also be swollen and red.  The discomfort can be relieved with ibuprofen (Advil, Motrin), acetaminophen (Extra Strength Tylenol), or similar pain relief medication.  Within 24 to 48 hours, a blister may form.  Occasionally, these blisters will become filled with blood and become very dark.  This is no cause for concern.  The blister will gradually dry up over a period of several days, eventually separating from the healing skin below in about one to two weeks.  The surrounding skin will become red and the area may become itchy.  This is all part of the normal healing process.  Occasionally, the crusts will last as long as four weeks when certain deeper spots on the skin are treated.  Sometimes a permanent white mark or scar will be left after healing.  You may continue all of your normal activities as long as they do not cause pain in the treated areas.  It is okay to get the area wet.  After therapy or if the blister is still intact - You may treat it like normal skin.  Covering the area with a bandage may offer some comfort and protection from trauma but is not absolutely necessary.  If the blister is uncomfortable - Clean a small needle with rubbing alcohol, then gently make a small hole in the side of the blister to drain the blister fluid.  This often gives immediate relief.  Do not remove the  blister roof, as the blister aids in healing.  In time it will fall off on its own.   Once the blister roof falls off or a sore forms -  . Clean the blister sites with soap and water.  Rinse the area and pat dry.  Do not force off the blister roof or crust. . Apply an antibiotic ointment such as Polysporin or Bacitracin. . A bandage may be applied loosely over the blister until it is healed if desired. . Call our office if you are concerned it may be infected.  Some redness, itching and oozing is part of the normal healing process.  Signs of infection include increasing redness, increasing pain,  swelling, heat, or yellow discharge.  If you have any questions or concerns for your doctor, please call our main line at [phone] and press option 4 to reach your doctor's medical assistant. If no one answers, please leave a voicemail as directed and we will return your call as soon as possible. Messages left after 4 pm will be answered the following business day.   You may also send [place] a message via [place]. We typically respond to MyChart messages within 1-2 business days.  For prescription refills, please ask your pharmacy to contact our office. Our fax number is [phone].  If you have an urgent issue when the clinic is closed that cannot wait until the next business day, you can page your doctor at the number below.    Please note that while we do our best to be available for urgent issues outside of office hours, we are not available 24/7.   If you have an urgent issue and are unable to reach [place], you may choose to seek medical care at your doctor's office, retail clinic, urgent care center, or emergency room.  If you have a medical emergency, please immediately call 911 or go to the emergency department.  Pager Numbers  - Dr. Nehemiah Massed: [phone]  - Dr. Laurence Ferrari: [phone]  - Dr. Nicole Kindred: [phone]  In the event of inclement weather, please call our main line at [phone] for an update on the status of any delays or closures.  Dermatology Medication Tips: Please keep the boxes that topical medications come in in order to help keep track of the instructions about where and how to use these. Pharmacies typically print the medication instructions only on the boxes and not directly on the medication tubes.   If your medication is too expensive, please contact our office at [phone] option 4 or send [place] a message through [place].   We are unable to tell what your co-pay for medications will be in advance as this is different depending on your insurance coverage.  However, we may be able to find a substitute medication at lower cost or fill out paperwork to get insurance to cover a needed medication.   If a prior authorization is required to get your medication covered by your insurance company, please allow [place] 1-2 business days to complete this process.  Drug prices often vary depending on where the prescription is filled and some pharmacies may offer cheaper prices.  The website www.goodrx.com contains coupons for medications through different pharmacies. The prices here do not account for what the cost may be with help from insurance (it may be cheaper with your insurance), but the website can give you the price if you did not use any insurance.  - You can print the associated coupon and take it with your prescription to  the pharmacy.  - You may also stop by our office during regular business hours and pick up a GoodRx coupon card.  - If you need your prescription sent electronically to a different pharmacy, notify our office through [facility] or by phone at [phone] option 4.

## 2020-06-03 ENCOUNTER — Telehealth: Payer: Self-pay

## 2020-06-03 LAB — ANAEROBIC AND AEROBIC CULTURE

## 2020-06-03 LAB — SPECIMEN STATUS REPORT

## 2020-06-03 NOTE — Telephone Encounter (Signed)
Patient advised bx's BCC. Patient scheduled for [facility], [place]

## 2020-06-05 ENCOUNTER — Telehealth: Payer: Self-pay

## 2020-06-05 NOTE — Telephone Encounter (Signed)
Called and informed patient of their culture results. Patient reports he is doing good after prescribed doxycycline. Informed patient of other recommendations suggested by provider. He verbalized understanding and denied further questions.

## 2020-06-05 NOTE — Telephone Encounter (Signed)
-----   Message from Alfonso Patten, MD sent at 06/05/2020  8:43 AM EDT ----- Culture grew heavy growth of Cutibacterium acnes(acne causing) bacteria  If doing well after doxycycline, no treatment needed.  If still getting bumps, start clindamycin solution twice a day as needed. Recommend pairing with Cln shampoo to prevent antibiotic resistance.  MAs please call. Thank you!

## 2020-06-24 ENCOUNTER — Other Ambulatory Visit: Payer: Self-pay | Admitting: Internal Medicine

## 2020-06-25 ENCOUNTER — Encounter: Payer: Self-pay | Admitting: Dermatology

## 2020-06-25 ENCOUNTER — Ambulatory Visit (INDEPENDENT_AMBULATORY_CARE_PROVIDER_SITE_OTHER): Payer: Medicare Other | Admitting: Dermatology

## 2020-06-25 ENCOUNTER — Other Ambulatory Visit: Payer: Self-pay

## 2020-06-25 DIAGNOSIS — C44519 Basal cell carcinoma of skin of other part of trunk: Secondary | ICD-10-CM | POA: Diagnosis not present

## 2020-06-25 DIAGNOSIS — C44319 Basal cell carcinoma of skin of other parts of face: Secondary | ICD-10-CM

## 2020-06-25 DIAGNOSIS — L570 Actinic keratosis: Secondary | ICD-10-CM

## 2020-06-25 DIAGNOSIS — L821 Other seborrheic keratosis: Secondary | ICD-10-CM | POA: Diagnosis not present

## 2020-06-25 DIAGNOSIS — C44619 Basal cell carcinoma of skin of left upper limb, including shoulder: Secondary | ICD-10-CM

## 2020-06-25 DIAGNOSIS — L720 Epidermal cyst: Secondary | ICD-10-CM

## 2020-06-25 NOTE — Patient Instructions (Addendum)
Wound Care Instructions  Cleanse wound gently with soap and water once a day then pat dry with clean gauze. Apply a thing coat of Petrolatum (petroleum jelly, "Vaseline") over the wound (unless you have an allergy to this). We recommend that you use a Mupirocin ointment.  Do not pick or remove scabs. Do not remove the yellow or white "healing tissue" from the base of the wound.  Cover the wound with fresh, clean, nonstick gauze and secure with paper tape. You may use Band-Aids in place of gauze and tape if the would is small enough, but would recommend trimming much of the tape off as there is often too much. Sometimes Band-Aids can irritate the skin.  You should call the office for your biopsy report after 1 week if you have not already been contacted.  If you experience any problems, such as abnormal amounts of bleeding, swelling, significant bruising, significant pain, or evidence of infection, please call the office immediately.  FOR ADULT SURGERY PATIENTS: If you need something for pain relief you may take 1 extra strength Tylenol (acetaminophen) AND 2 Ibuprofen (200mg  each) together every 4 hours as needed for pain. (do not take these if you are allergic to them or if you have a reason you should not take them.) Typically, you may only need pain medication for 1 to 3 days.   If you have any questions or concerns for your doctor, please call our main line at [phone] and press option 4 to reach your doctor's medical assistant. If no one answers, please leave a voicemail as directed and we will return your call as soon as possible. Messages left after 4 pm will be answered the following business day.   You may also send [place] a message via [place]. We typically respond to MyChart messages within 1-2 business days.  For prescription refills, please ask your pharmacy to contact our office. Our fax number is [phone].  If you have an urgent issue when the clinic is closed that cannot wait  until the next business day, you can page your doctor at the number below.    Please note that while we do our best to be available for urgent issues outside of office hours, we are not available 24/7.   If you have an urgent issue and are unable to reach [place], you may choose to seek medical care at your doctor's office, retail clinic, urgent care center, or emergency room.  If you have a medical emergency, please immediately call 911 or go to the emergency department.  Pager Numbers  - Dr. Nehemiah Massed: [phone]  - Dr. Laurence Ferrari: [phone]  - Dr. Nicole Kindred: [phone]  In the event of inclement weather, please call our main line at [phone] for an update on the status of any delays or closures.  Dermatology Medication Tips: Please keep the boxes that topical medications come in in order to help keep track of the instructions about where and how to use these. Pharmacies typically print the medication instructions only on the boxes and not directly on the medication tubes.   If your medication is too expensive, please contact our office at [phone] option 4 or send [place] a message through [place].   We are unable to tell what your co-pay for medications will be in advance as this is different depending on your insurance coverage. However, we may be able to find a substitute medication at lower cost or fill out paperwork to get insurance to cover a needed medication.  If a prior authorization is required to get your medication covered by your insurance company, please allow [place] 1-2 business days to complete this process.  Drug prices often vary depending on where the prescription is filled and some pharmacies may offer cheaper prices.  The website www.goodrx.com contains coupons for medications through different pharmacies. The prices here do not account for what the cost may be with help from insurance (it may be cheaper with your insurance), but the website can give you the price if you  did not use any insurance.  - You can print the associated coupon and take it with your prescription to the pharmacy.  - You may also stop by our office during regular business hours and pick up a GoodRx coupon card.  - If you need your prescription sent electronically to a different pharmacy, notify our office through [facility] or by phone at [phone] option 4.   Recommend taking Heliocare sun protection supplement daily in sunny weather for additional sun protection. For maximum protection on the sunniest days, you can take up to 2 capsules of regular Heliocare OR take 1 capsule of Heliocare Ultra. For prolonged exposure (such as a full day in the sun), you can repeat your dose of the supplement 4 hours after your first dose. Heliocare can be purchased at [facility] or at [email or web address].     Recommend Niacinamide or Nicotinamide 500mg  twice per day to lower risk of non-melanoma skin cancer by approximately 25%. This is usually available at [employer].

## 2020-06-25 NOTE — Progress Notes (Signed)
Follow-Up Visit   Subjective  Jordan Figueroa is a 87 y.o. male who presents for the following: Follow-up (Biopsy proven BASAL CELL CARCINOMA, NODULAR PATTERN at the right forehead and Left axilla, pt here for treatment ).  He notes a spot at the left axilla and some spots on his arms.   The following portions of the chart were reviewed this encounter and updated as appropriate:   Tobacco  Allergies  Meds  Problems  Med Hx  Surg Hx  Fam Hx       Review of Systems:  No other skin or systemic complaints except as noted in HPI or Assessment and Plan.  Objective  Well appearing patient in no apparent distress; mood and affect are within normal limits.  A focused examination was performed including right forehead, left axilla, arms, scalp. Relevant physical exam findings are noted in the Assessment and Plan.  right forehead Pink pearly papule or plaque with arborizing vessels.   Left Axilla Pink pearly papule or plaque with arborizing vessels.   Left Axilla Subcutaneous nodule.   right ventral forearm x 2 (2) Erythematous thin papules/macules with gritty scale.    Assessment & Plan  Basal cell carcinoma (BCC) of skin of other part of face right forehead  Destruction of lesion  Destruction method: electrodesiccation and curettage   Informed consent: discussed and consent obtained   Timeout:  patient name, date of birth, surgical site, and procedure verified Anesthesia: the lesion was anesthetized in a standard fashion   Anesthetic:  1% lidocaine w/ epinephrine 1-100,000 buffered w/ 8.4% NaHCO3 Curettage performed in three different directions: Yes   Electrodesiccation performed over the curetted area: Yes   Curettage cycles:  3 Final wound size (cm):  1.6 Hemostasis achieved with:  electrodesiccation Outcome: patient tolerated procedure well with no complications   Post-procedure details: sterile dressing applied and wound care instructions given   Dressing type:  petrolatum    Discussed treatments options at length today including Mohs surgery vs ED&C. Recommended Mohs surgery for highest cure rate (98-99%) and lower risk of BCC recurring and spreading necessitating a larger surgery later. Patient declines surgery, prefers ED&C  Basal cell carcinoma (BCC) of skin of left upper extremity including shoulder Left Axilla  Destruction of lesion  Destruction method: electrodesiccation and curettage   Informed consent: discussed and consent obtained   Timeout:  patient name, date of birth, surgical site, and procedure verified Anesthesia: the lesion was anesthetized in a standard fashion   Anesthetic:  1% lidocaine w/ epinephrine 1-100,000 buffered w/ 8.4% NaHCO3 Curettage performed in three different directions: Yes   Electrodesiccation performed over the curetted area: Yes   Curettage cycles:  3 Final wound size (cm):  1.1 Hemostasis achieved with:  electrodesiccation Outcome: patient tolerated procedure well with no complications   Post-procedure details: sterile dressing applied and wound care instructions given   Dressing type: petrolatum    Epidermal inclusion cyst Left Axilla  Benign-appearing. Exam most consistent with an epidermal inclusion cyst. Discussed that a cyst is a benign growth that can grow over time and sometimes get irritated or inflamed. Recommend observation if it is not bothersome. Discussed option of surgical excision to remove it if it is growing, symptomatic, or other changes noted. Please call for new or changing lesions so they can be evaluated.    AK (actinic keratosis) (2) right ventral forearm x 2  Prior to procedure, discussed risks of blister formation, small wound, skin dyspigmentation, or rare scar following cryotherapy.  Recommend Vaseline ointment to treated areas while healing.   Destruction of lesion - right ventral forearm x 2  Destruction method: cryotherapy   Informed consent: discussed and consent  obtained   Lesion destroyed using liquid nitrogen: Yes   Cryotherapy cycles:  2 Outcome: patient tolerated procedure well with no complications   Post-procedure details: wound care instructions given    Seborrheic Keratoses Scalp and arms - Stuck-on, waxy, tan-brown papules and/or plaques  - Benign-appearing - Discussed benign etiology and prognosis. - Observe - Call for any changes    Return in about 3 months (around 09/25/2020) for recheck [place] sites, 6 months TBSE .  I, Marye Round, CMA, am acting as scribe for Forest Gleason, MD .   Documentation: I have reviewed the above documentation for accuracy and completeness, and I agree with the above.  Forest Gleason, MD

## 2020-07-10 ENCOUNTER — Other Ambulatory Visit: Payer: Self-pay

## 2020-07-10 ENCOUNTER — Ambulatory Visit (INDEPENDENT_AMBULATORY_CARE_PROVIDER_SITE_OTHER): Payer: Medicare Other | Admitting: Dermatology

## 2020-07-10 ENCOUNTER — Encounter: Payer: Self-pay | Admitting: Dermatology

## 2020-07-10 DIAGNOSIS — L739 Follicular disorder, unspecified: Secondary | ICD-10-CM | POA: Diagnosis not present

## 2020-07-10 DIAGNOSIS — C44319 Basal cell carcinoma of skin of other parts of face: Secondary | ICD-10-CM

## 2020-07-10 DIAGNOSIS — L308 Other specified dermatitis: Secondary | ICD-10-CM

## 2020-07-10 DIAGNOSIS — D485 Neoplasm of uncertain behavior of skin: Secondary | ICD-10-CM

## 2020-07-10 MED ORDER — CLINDAMYCIN PHOS-BENZOYL PEROX 1-5 % EX GEL: Freq: Two times a day (BID) | CUTANEOUS | 2 refills | Status: DC

## 2020-07-10 NOTE — Patient Instructions (Addendum)
Wound Care Instructions  Cleanse wound gently with soap and water once a day then pat dry with clean gauze. Apply a thing coat of Petrolatum (petroleum jelly, "Vaseline") over the wound (unless you have an allergy to this). We recommend that you use a new, sterile tube of Vaseline. Do not pick or remove scabs. Do not remove the yellow or white "healing tissue" from the base of the wound.  Cover the wound with fresh, clean, nonstick gauze and secure with paper tape. You may use Band-Aids in place of gauze and tape if the would is small enough, but would recommend trimming much of the tape off as there is often too much. Sometimes Band-Aids can irritate the skin.  You should call the office for your biopsy report after 1 week if you have not already been contacted.  If you experience any problems, such as abnormal amounts of bleeding, swelling, significant bruising, significant pain, or evidence of infection, please call the office immediately.  FOR ADULT SURGERY PATIENTS: If you need something for pain relief you may take 1 extra strength Tylenol (acetaminophen) AND 2 Ibuprofen (200mg each) together every 4 hours as needed for pain. (do not take these if you are allergic to them or if you have a reason you should not take them.) Typically, you may only need pain medication for 1 to 3 days.   If you have any questions or concerns for your doctor, please call our main line at [phone] and press option 4 to reach your doctor's medical assistant. If no one answers, please leave a voicemail as directed and we will return your call as soon as possible. Messages left after 4 pm will be answered the following business day.   You may also send us a message via MyChart. We typically respond to MyChart messages within 1-2 business days.  For prescription refills, please ask your pharmacy to contact our office. Our fax number is [phone].  If you have an urgent issue when the clinic is closed that  cannot wait until the next business day, you can page your doctor at the number below.    Please note that while we do our best to be available for urgent issues outside of office hours, we are not available 24/7.   If you have an urgent issue and are unable to reach us, you may choose to seek medical care at your doctor's office, retail clinic, urgent care center, or emergency room.  If you have a medical emergency, please immediately call 911 or go to the emergency department.  Pager Numbers  - Dr. Kowalski: [phone]  - Dr. Moye: [phone]  - Dr. Stewart: [phone]  In the event of inclement weather, please call our main line at [phone] for an update on the status of any delays or closures.  Dermatology Medication Tips: Please keep the boxes that topical medications come in in order to help keep track of the instructions about where and how to use these. Pharmacies typically print the medication instructions only on the boxes and not directly on the medication tubes.   If your medication is too expensive, please contact our office at [phone] option 4 or send us a message through MyChart.   We are unable to tell what your co-pay for medications will be in advance as this is different depending on your insurance coverage. However, we may be able to find a substitute medication at lower cost or fill out paperwork to get insurance to cover a needed   medication.   If a prior authorization is required to get your medication covered by your insurance company, please allow [place] 1-2 business days to complete this process.  Drug prices often vary depending on where the prescription is filled and some pharmacies may offer cheaper prices.  The website www.goodrx.com contains coupons for medications through different pharmacies. The prices here do not account for what the cost may be with help from insurance (it may be cheaper with your insurance), but the website can give you the  price if you did not use any insurance.  - You can print the associated coupon and take it with your prescription to the pharmacy.  - You may also stop by our office during regular business hours and pick up a GoodRx coupon card.  - If you need your prescription sent electronically to a different pharmacy, notify our office through [facility] or by phone at [phone] option 4.  Start benzaclin 1-2 times daily to affected area at scalp as needed.  Benzoyl peroxide can cause dryness and irritation of the skin. It can also bleach fabric. When used together with Aczone (dapsone) cream, it can stain the skin orange.

## 2020-07-10 NOTE — Progress Notes (Addendum)
   Follow-Up Visit   Subjective  Jordan Figueroa is a 87 y.o. male who presents for the following: Rash (Patient here today for rash at neck. Patient was treated for folliculitis 4 weeks ago and was given 2 weeks of doxycycline. Patient advises it did improve but has gotten worse. ) and Skin Problem (Patient with a spot at right lower leg that is sometimes sore, present for > 1 month. ).  The following portions of the chart were reviewed this encounter and updated as appropriate:   Tobacco  Allergies  Meds  Problems  Med Hx  Surg Hx  Fam Hx      Review of Systems:  No other skin or systemic complaints except as noted in HPI or Assessment and Plan.  Objective  Well appearing patient in no apparent distress; mood and affect are within normal limits.  A focused examination was performed including scalp, right leg. Relevant physical exam findings are noted in the Assessment and Plan.  Right lateral calf 2.5cm violaceous scaly plaque R/o angiodermatitis vs malignancy       scalp Pustules at scalp   Assessment & Plan  Neoplasm of uncertain behavior of skin Right lateral calf  Skin / nail biopsy Type of biopsy: punch   Informed consent: discussed and consent obtained   Timeout: patient name, date of birth, surgical site, and procedure verified   Patient was prepped and draped in usual sterile fashion: Area prepped with isopropyl alcohol. Anesthesia: the lesion was anesthetized in a standard fashion   Anesthetic:  1% lidocaine w/ epinephrine 1-100,000 buffered w/ 8.4% NaHCO3 Punch size:  4 mm Suture size:  4-0 Suture type: Prolene (polypropylene)   Hemostasis achieved with: suture and aluminum chloride   Outcome: patient tolerated procedure well   Post-procedure details: wound care instructions given   Additional details:  Mupirocin and a dressing applied Skin at plaque fragile, advised stitches may pull through and wound open. Advised to continue mupirocin and cover if  this happens.   Specimen 1 - Surgical pathology Differential Diagnosis: R/o angiodermatitis vs malignancy  Check Margins: No 2.5cm violaceous scaly plaque   Reviewed increased risk of poor healing, dehiscence, infection in this area.   Folliculitis scalp  Symptomatic, recurrent s/p doxycycline therapy  Previous cultured showing cutibacterium acnes.  Start benzaclin 1-2 times daily to affected area at scalp as needed.  Benzoyl peroxide can cause dryness and irritation of the skin. It can also bleach fabric. When used together with Aczone (dapsone) cream, it can stain the skin orange.   clindamycin-benzoyl peroxide (BENZACLIN WITH PUMP) gel - scalp Apply topically 2 (two) times daily. Apply twice daily to affected areas at scalp as needed  Return for 2 week suture removal.  Graciella Belton, RMA, am acting as scribe for Forest Gleason, MD .  Documentation: I have reviewed the above documentation for accuracy and completeness, and I agree with the above.  Forest Gleason, MD

## 2020-07-10 NOTE — Progress Notes (Deleted)
   Follow-Up Visit   Subjective  Jordan Figueroa is a 87 y.o. male who presents for the following: Skin Problem (Patient here today for rash at neck. Patient was treated for folliculitis 4 weeks ago and was given 2 weeks of doxycycline. Patient advises it did improve but has not gotten worse. ).  Patient does have 2 bx proven BCC's that he is scheduled to have treated with [facility] in September.   The following portions of the chart were reviewed this encounter and updated as appropriate:        Review of Systems:  No other skin or systemic complaints except as noted in HPI or Assessment and Plan.  Objective  Well appearing patient in no apparent distress; mood and affect are within normal limits.  A focused examination was performed including scalp, right lower leg. Relevant physical exam findings are noted in the Assessment and Plan.    Assessment & Plan   No follow-ups on file.  Jordan Figueroa, RMA, am acting as scribe for Jordan Gleason, MD .

## 2020-07-23 ENCOUNTER — Telehealth: Payer: Self-pay

## 2020-07-23 NOTE — Telephone Encounter (Signed)
-----   Message from Brendolyn Patty, MD sent at 07/22/2020  8:55 PM EDT ----- Skin , right lateral calf ANGIODERMATITIS CONSISTENT WITH STASIS DERMATITIS  Benign angiodermatitis, sutures need to be removed if still present- please call pt

## 2020-07-23 NOTE — Telephone Encounter (Signed)
Advised patient of results. Patient had no further questions.

## 2020-07-24 ENCOUNTER — Other Ambulatory Visit: Payer: Self-pay

## 2020-07-24 ENCOUNTER — Ambulatory Visit (INDEPENDENT_AMBULATORY_CARE_PROVIDER_SITE_OTHER): Payer: Medicare Other

## 2020-07-24 DIAGNOSIS — L309 Dermatitis, unspecified: Secondary | ICD-10-CM

## 2020-07-24 NOTE — Patient Instructions (Signed)

## 2020-07-24 NOTE — Progress Notes (Addendum)
   Follow-Up Visit   Subjective  Jordan Figueroa is a 87 y.o. male who presents for the following: Suture / Staple Removal (Suture removal for punch biopsy on right lateral calf).   Objective  Well appearing patient in no apparent distress; mood and affect are within normal limits.  Right Thigh - Anterior Incision site is clean, dry and intact  Assessment & Plan  1. Dermatitis Right Thigh - Anterior Incision site is clean, dry and intact   Wound cleansed, sutures removed, wound cleansed and bandage applied.      No follow-ups on file.  I, Harriett Sine, CMA, am acting as scribe for [employer], CMA.

## 2020-07-26 ENCOUNTER — Other Ambulatory Visit: Payer: Self-pay

## 2020-07-26 ENCOUNTER — Ambulatory Visit (INDEPENDENT_AMBULATORY_CARE_PROVIDER_SITE_OTHER): Payer: Medicare Other | Admitting: Podiatry

## 2020-07-26 DIAGNOSIS — B351 Tinea unguium: Secondary | ICD-10-CM | POA: Diagnosis not present

## 2020-07-26 DIAGNOSIS — M79675 Pain in left toe(s): Secondary | ICD-10-CM

## 2020-07-26 DIAGNOSIS — M79674 Pain in right toe(s): Secondary | ICD-10-CM | POA: Diagnosis not present

## 2020-07-26 DIAGNOSIS — L989 Disorder of the skin and subcutaneous tissue, unspecified: Secondary | ICD-10-CM

## 2020-07-26 NOTE — Progress Notes (Signed)
SUBJECTIVE Patient presents to office today complaining of elongated, thickened nails that cause pain while ambulating in shoes.  He is unable to trim his own nails.  Patient also states that he developed symptomatic calluses to the bilateral forefoot.  He is even had surgery in the past to help resolve this issue which was unsuccessful.  He would like to have the calluses trimmed today.  Patient is here for further evaluation and treatment.  Past Medical History:  Diagnosis Date   Arthritis    osteo-right hip   Basal cell carcinoma 05/29/2020   right forehead, left axilla   Cancer ([place])    skin   Colon polyps    Dyspnea on exertion    a. 03/2015 Echo: Ef 60-65%, Gr1 DD, mild MR, nl RV fxn, mild PAH; b. 06/2015 RHC: minimal elevated PCWP; c.  04/2017 Echo: EF 60-65%, no rwma, GR1 DD, triv AI, mild MR, mildly dil LA, nl RV fxn, nl PASP.   Dysrhythmia    GERD (gastroesophageal reflux disease)    Hemorrhoids    Hiatal hernia    HOH (hard of hearing)    aides   Hypertension    IBS (irritable bowel syndrome)    Melanoma ([place]) 09/26/2012   Crown scalp. MM in situ.    Non-obstructive CAD (coronary artery disease)    a. 06/2015 Cath: LM 5, LAD 5ost, 30p, 54m, LCX nl, OM1 min irregs, OM2 nl, RCA 20p, 11m, RPDA/RPL1 min irregs, RPAV nl; b. 06/2017 Lexiscan MV: EF 55-65%, low risk.   PAF (paroxysmal atrial fibrillation) ([place])    a. Reported - no documentation in our records. Never seen on monitoring.   Paroxysmal supraventricular tachycardia [facility] March 2017   First documented during episode of influenza   Pulmonary embolism ([place])    a. Chronic eliquis.   PVC's (premature ventricular contractions)    a. 06/2017 24h Holter: Avg HR 67 (49-110), rare PACs up to 5 beat run, freq PVC's - 8% burden; b. 08/2017 Zio: Avg HR 64 (48-171), occas PACs & PVCs. 4 episodes of NSVT (4 beats longest). 48 episodes of SVT up to 12.4 secs, max rate 171.   Squamous cell carcinoma of skin 04/20/2014   Right 3rd  finger. WD SCC.     OBJECTIVE General Patient is awake, alert, and oriented x 3 and in no acute distress. Derm Skin is dry and supple bilateral. Negative open lesions or macerations. Remaining integument unremarkable. Nails are tender, long, thickened and dystrophic with subungual debris, consistent with onychomycosis, 1-5 bilateral. No signs of infection noted.  Hyperkeratotic preulcerative callus tissue also noted to the bilateral forefoot x4 Vasc  DP and PT pedal pulses palpable bilaterally. Temperature gradient within normal limits.  Neuro Epicritic and protective threshold sensation grossly intact bilaterally.  Musculoskeletal Exam No symptomatic pedal deformities noted bilateral. Muscular strength within normal limits.  ASSESSMENT 1.  Pain due to onychomycosis of toenail both 2.  Preulcerative symptomatic callus lesions bilateral feet 3. Pain in foot bilateral  PLAN OF CARE 1. Patient evaluated today.  2. Instructed to maintain good pedal hygiene and foot care.  3. Mechanical debridement of nails 1-5 bilaterally performed using a nail nipper. Filed with dremel without incident.  4.  Excisional debridement of the hyperkeratotic preulcerative callus tissue was performed using a chisel blade without incident or bleeding  5.  Return to clinic in 3 mos.    Edrick Kins, DPM [facility]  Dr. Edrick Kins, DPM  [address], [place] [address]                Office [phone]  Fax [phone]

## 2020-09-03 ENCOUNTER — Ambulatory Visit: Payer: Medicare Other | Admitting: Dermatology

## 2020-09-06 ENCOUNTER — Other Ambulatory Visit: Payer: Self-pay

## 2020-09-06 ENCOUNTER — Ambulatory Visit: Payer: Medicare Other | Admitting: Podiatry

## 2020-09-10 ENCOUNTER — Other Ambulatory Visit: Payer: Self-pay

## 2020-09-10 ENCOUNTER — Encounter: Payer: Self-pay | Admitting: Podiatry

## 2020-09-10 ENCOUNTER — Ambulatory Visit (INDEPENDENT_AMBULATORY_CARE_PROVIDER_SITE_OTHER): Payer: Medicare Other | Admitting: Podiatry

## 2020-09-10 DIAGNOSIS — M79674 Pain in right toe(s): Secondary | ICD-10-CM | POA: Diagnosis not present

## 2020-09-10 DIAGNOSIS — M79675 Pain in left toe(s): Secondary | ICD-10-CM

## 2020-09-10 DIAGNOSIS — B351 Tinea unguium: Secondary | ICD-10-CM | POA: Diagnosis not present

## 2020-09-10 DIAGNOSIS — L989 Disorder of the skin and subcutaneous tissue, unspecified: Secondary | ICD-10-CM

## 2020-09-10 NOTE — Progress Notes (Signed)
SUBJECTIVE Patient presents to office today complaining of elongated, thickened nails that cause pain while ambulating in shoes.  He is unable to trim his own nails.  Patient also states that he developed symptomatic calluses to the bilateral forefoot.  He is even had surgery in the past to help resolve this issue which was unsuccessful.  He would like to have the calluses trimmed today.  Patient is here for further evaluation and treatment.  Past Medical History:  Diagnosis Date   Arthritis    osteo-right hip   Basal cell carcinoma 05/29/2020   right forehead, left axilla   Cancer ([place])    skin   Colon polyps    Dyspnea on exertion    a. 03/2015 Echo: Ef 60-65%, Gr1 DD, mild MR, nl RV fxn, mild PAH; b. 06/2015 RHC: minimal elevated PCWP; c.  04/2017 Echo: EF 60-65%, no rwma, GR1 DD, triv AI, mild MR, mildly dil LA, nl RV fxn, nl PASP.   Dysrhythmia    GERD (gastroesophageal reflux disease)    Hemorrhoids    Hiatal hernia    HOH (hard of hearing)    aides   Hypertension    IBS (irritable bowel syndrome)    Melanoma ([place]) 09/26/2012   vertex scalp. MM in situ.   Non-obstructive CAD (coronary artery disease)    a. 06/2015 Cath: LM 5, LAD 5ost, 30p, 53m LCX nl, OM1 min irregs, OM2 nl, RCA 20p, 264mRPDA/RPL1 min irregs, RPAV nl; b. 06/2017 Lexiscan MV: EF 55-65%, low risk.   [place]) [place]   a. Reported - no documentation in our records. Never seen on monitoring.   Parox[place]cardia [place]   First documented during episode of influenza   [place]ulmonary embolism ([place]   a. Chronic eliquis.   PVC's (premature ventricular contractions)    a. 06/2017 24h Holter: Avg HR 67 (49-110), rare PACs up to 5 beat run, freq PVC's - 8% burden; b. 08/2017 Zio: Avg HR 64 (48-171), occas PACs & PVCs. 4 episodes of NSVT (4 beats longest). 48 episodes of SVT up to 12.4 secs, max rate 171.   Squamous cell carcinoma of skin 04/20/2014   Right 3rd finger.  WD SCC.     OBJECTIVE General Patient is awake, alert, and oriented x 3 and in no acute distress. Derm Skin is dry and supple bilateral. Negative open lesions or macerations. Remaining integument unremarkable. Nails are tender, long, thickened and dystrophic with subungual debris, consistent with onychomycosis, 1-5 bilateral. No signs of infection noted.  Hyperkeratotic preulcerative callus tissue also noted to the bilateral forefoot x4 Vasc  DP and PT pedal pulses palpable bilaterally. Temperature gradient within normal limits.  Neuro Epicritic and protective threshold sensation grossly intact bilaterally.  Musculoskeletal Exam No symptomatic pedal deformities noted bilateral. Muscular strength within normal limits.  ASSESSMENT 1.  Pain due to onychomycosis of toenail both 2.  Preulcerative symptomatic callus lesions bilateral feet 3. Pain in foot bilateral  PLAN OF CARE 1. Patient evaluated today.  2. Instructed to maintain good pedal hygiene and foot care.  3. Mechanical debridement of nails 1-5 bilaterally performed using a nail nipper. Filed with dremel without incident.  4.  Excisional debridement of the hyperkeratotic preulcerative callus tissue was performed using a chisel blade without incident or bleeding  5.  Return to clinic in 3 mos.    BrEdrick KinsDPM [facility]  Dr. BrEdrick KinsDPM  [address], [place] [address]                Office [phone]  Fax [phone]

## 2020-09-18 ENCOUNTER — Ambulatory Visit (INDEPENDENT_AMBULATORY_CARE_PROVIDER_SITE_OTHER): Payer: Medicare Other | Admitting: Internal Medicine

## 2020-09-18 ENCOUNTER — Other Ambulatory Visit: Payer: Self-pay

## 2020-09-18 ENCOUNTER — Encounter: Payer: Self-pay | Admitting: Internal Medicine

## 2020-09-18 VITALS — BP 130/50 | HR 63 | Ht 71.0 in | Wt 220.0 lb

## 2020-09-18 DIAGNOSIS — I480 Paroxysmal atrial fibrillation: Secondary | ICD-10-CM | POA: Diagnosis not present

## 2020-09-18 DIAGNOSIS — I5032 Chronic diastolic (congestive) heart failure: Secondary | ICD-10-CM | POA: Diagnosis not present

## 2020-09-18 DIAGNOSIS — I471 Supraventricular tachycardia: Secondary | ICD-10-CM | POA: Diagnosis not present

## 2020-09-18 DIAGNOSIS — I2510 Atherosclerotic heart disease of native coronary artery without angina pectoris: Secondary | ICD-10-CM | POA: Diagnosis not present

## 2020-09-18 NOTE — Patient Instructions (Signed)

## 2020-09-18 NOTE — Progress Notes (Signed)
Follow-up Outpatient Visit Date: 09/18/2020  Primary Care Provider: Kirk Ruths, MD [place] [address]  Chief Complaint: Follow-up nonobstructive CAD and questionable atrial fibrillation  HPI:  Jordan Figueroa is a 87 y.o. male with history of non-obstructive coronary artery disease, pulmonary embolism, paroxysmal SVT, questionable paroxysmal atrial fibrillation, hiatal hernia, IBS, and GERD, who presents for follow-up of coronary artery disease and atrial fibrillation.  I last saw him in March at which time he was doing fairly well though he complained of pain and stiffness in his shoulders, particularly when lying in bed.  He also described sporadic aching in the left side of his chest occurring every 6 to 8 weeks.  We discussed restarting isosorbide mononitrate as this had previously offered some improvement, though Jordan Figueroa wished to decline.  We agreed to defer additional testing given infrequent nature of the pain and his age.  Today, Jordan Figueroa reports that he has been feeling quite well.  He has not had any further chest pain.  He reports some exertional dyspnea if he overexerts himself, which has been stable for more than a year.  He has chronic waxing and waning swelling of the left lower extremity for which he wears a compression stocking.  Overall, edema is stable.  He tries to minimize his sodium intake, adding salt only when eating potatoes or tomatoes.  He denies palpitations and lightheadedness.  He bleeds easily if he bumps into something but otherwise has not had significant bleeding, remaining on apixaban.  --------------------------------------------------------------------------------------------------  Cardiovascular History & Procedures: Cardiovascular Problems: Non-obstructive CAD ? Paroxysmal atrial fibrillation Paroxysmal SVT Pulmonary embolism   Risk Factors: Known CAD, age, and male gender    Cath/PCI: LHC/RHC (06/28/15): Mild diffuse disease involving the left coronary artery. 20-25% proximal and mid/distal RCA lesions. Mildly elevated left heart filling pressure. Normal right heart filling and pulmonary artery pressures. Normal LVEF with mildly reduced Fick cardiac output/index.   CV Surgery: None   EP Procedures and Devices: Event monitor (08/24/2017): Predominantly sinus rhythm with occasional PACs and PVCs.  48 episodes of PSVT up to 12.4 seconds. 24-hour Holter monitor (06/22/2017): This rhythm with frequent PVCs (8% burden) and rare PACs.  Single brief atrial run noted.   Non-Invasive Evaluation(s): Transthoracic echocardiogram (07/28/2018): Normal LV size and wall thickness.  LVEF 60-65% with grade 1 diastolic dysfunction.  Normal RV size and function.  No significant valvular abnormality. Pharmacologic MPI (04/12/2018): Low risk study without ischemia or scar.  LVEF calculated at 40%, likely artifactually low due to GI uptake. Transthoracic echocardiogram (04/29/2017): Normal LV size and LVEF (60 to 65%) with grade 1 diastolic dysfunction.  Trivial AI and mild MR.  Mild left atrial enlargement.  Normal RV contraction. Exercise myocardial perfusion stress test (06/07/15): Low risk scan without evidence of ischemia. Apical thinning noted. LVEF reduced at 40%, a likely artifactual. Transthoracic echocardiogram (04/10/15): Normal LV size and function with LVEF is 60-65%. Grade 1 diastolic dysfunction. Mild mitral regurgitation. Normal RV size and function. Mild pulmonary hypertension.  Recent CV Pertinent Labs: Lab Results  Component Value Date   CHOL 109 07/28/2018   HDL 38 (L) 07/28/2018   LDLCALC 61 07/28/2018   TRIG 48 07/28/2018   CHOLHDL 2.9 07/28/2018   INR 1.23 06/26/2015   BNP 87.0 04/10/2015   K 3.9 08/09/2018   K 4.1 07/17/2013   MG 2.3 08/09/2018   BUN 15 08/09/2018   BUN 9 07/17/2013   CREATININE 0.95  08/09/2018   CREATININE 0.92 07/17/2013    Past medical  and surgical history were reviewed and updated in EPIC.  Current Meds  Medication Sig   apixaban (ELIQUIS) 5 MG TABS tablet Take 1 tablet (5 mg total) by mouth 2 (two) times daily.   finasteride (PROSCAR) 5 MG tablet Take 1 tablet (5 mg total) by mouth daily.   furosemide (LASIX) 40 MG tablet TAKE 1 TABLET BY MOUTH EVERY DAY   gabapentin (NEURONTIN) 100 MG capsule Take 100 mg by mouth 3 (three) times daily.   nitroGLYCERIN (NITROSTAT) 0.4 MG SL tablet Place 1 tablet (0.4 mg total) under the tongue every 5 (five) minutes as needed for chest pain. Maximum of 3 doses.    Allergies: Patient has no known allergies.  Social History   Tobacco Use   Smoking status: Former    Packs/day: 2.00    Years: 25.00    Pack years: 50.00    Types: Cigarettes    Quit date: 02/25/1973    Years since quitting: 47.5   Smokeless tobacco: Never  Vaping Use   Vaping Use: Never used  Substance Use Topics   Alcohol use: No    Alcohol/week: 0.0 standard drinks   Drug use: Never    Family History  Problem Relation Age of Onset   CVA Father    CAD Father    Alzheimer's disease Mother    Heart Problems Sister    Heart attack Brother    CAD Brother    Prostate cancer Neg Hx    Bladder Cancer Neg Hx    Kidney cancer Neg Hx     Review of Systems: A 12-system review of systems was performed and was negative except as noted in the HPI.  --------------------------------------------------------------------------------------------------  Physical Exam: BP (!) 130/50 (BP Location: Left Arm, Patient Position: Sitting, Cuff Size: Large)   Pulse 63   Ht '5\' 11"'$  (1.803 m)   Wt 220 lb (99.8 kg)   SpO2 97%   BMI 30.68 kg/m   General:  NAD. Neck: No JVD or HJR. Lungs: Clear to auscultation bilaterally without wheezes or crackles. Heart: Regular rate and rhythm without murmurs, rubs, or gallops. Abdomen: Soft, nontender, nondistended. Extremities: 1+ pretibial edema, left greater than right.  EKG:  Normal sinus rhythm with first-degree AV block, PACs, left axis deviation, and incomplete RBBB.  Compared with prior tracing from 03/13/2020, PVCs are no longer present.  Otherwise, no significant interval change.  Lab Results  Component Value Date   WBC 7.7 08/09/2018   HGB 15.5 08/09/2018   HCT 45.8 08/09/2018   MCV 88 08/09/2018   PLT 253 08/09/2018    Lab Results  Component Value Date   NA 141 08/09/2018   K 3.9 08/09/2018   CL 102 08/09/2018   CO2 26 08/09/2018   BUN 15 08/09/2018   CREATININE 0.95 08/09/2018   GLUCOSE 146 (H) 08/09/2018   ALT 22 04/10/2015    Lab Results  Component Value Date   CHOL 109 07/28/2018   HDL 38 (L) 07/28/2018   LDLCALC 61 07/28/2018   TRIG 48 07/28/2018   CHOLHDL 2.9 07/28/2018    --------------------------------------------------------------------------------------------------  ASSESSMENT AND PLAN: Coronary artery disease: Jordan Figueroa has not had any further chest pain and reports stable exertional dyspnea.  He previously declined additional antianginal therapy, which I think is reasonable.  Continue to work on medical therapy to prevent progression of disease.  Given low LDL at baseline, it is reasonable to defer adding a  statin.  Defer aspirin therapy given long-term anticoagulation with apixaban.  PSVT and PAF: No palpitations reported.  EKG today again shows sinus rhythm with first-degree AV block and incomplete right bundle branch block.  Isolated PAC is present.  I think it is reasonable to continue apixaban at the discretion of Dr. Ouida Sills as long as Jordan Figueroa does not have any significant bleeding.  Chronic HFpEF: Chronic lower extremity edema is stable.  Jordan Figueroa reports NYHA class II symptoms that have been stable for more than a year.  We will continue furosemide 40 mg daily.  Follow-up: Return to clinic in 6 months.  Nelva Bush, MD 09/18/2020 8:29 AM

## 2020-09-27 ENCOUNTER — Other Ambulatory Visit: Payer: Self-pay | Admitting: Internal Medicine

## 2020-10-09 ENCOUNTER — Ambulatory Visit (INDEPENDENT_AMBULATORY_CARE_PROVIDER_SITE_OTHER): Payer: Medicare Other | Admitting: Dermatology

## 2020-10-09 ENCOUNTER — Other Ambulatory Visit: Payer: Self-pay

## 2020-10-09 DIAGNOSIS — Z872 Personal history of diseases of the skin and subcutaneous tissue: Secondary | ICD-10-CM

## 2020-10-09 DIAGNOSIS — Z85828 Personal history of other malignant neoplasm of skin: Secondary | ICD-10-CM | POA: Diagnosis not present

## 2020-10-09 NOTE — Patient Instructions (Addendum)

## 2020-10-09 NOTE — Progress Notes (Signed)
   Follow-Up Visit   Subjective  Jordan Figueroa is a 87 y.o. male who presents for the following: Follow-up (Patient here today for follow up on right forehead and left axilla that were treated with ED&C. Biopsy was proven bcc. He reports no new spots or areas of concerns. ).  The following portions of the chart were reviewed this encounter and updated as appropriate:  Tobacco  Allergies  Meds  Problems  Med Hx  Surg Hx  Fam Hx      Review of Systems: No other skin or systemic complaints except as noted in HPI or Assessment and Plan.   Objective  Well appearing patient in no apparent distress; mood and affect are within normal limits.  A focused examination was performed including right forehead, left axilla, and right ventral forearm. Relevant physical exam findings are noted in the Assessment and Plan.  Left Axilla, Right Forehead Healing scars   Assessment & Plan  History of basal cell carcinoma (BCC) (2) Left Axilla; Right Forehead  Biopsy proven bcc at both locations  Previously treated with ED&C  Clear. Observe for recurrence. Call clinic for new or changing lesions.  Recommend regular skin exams, daily broad-spectrum spf 30+ sunscreen use, and photoprotection.    History of PreCancerous Actinic Keratosis  - site(s) of PreCancerous Actinic Keratosis clear today at right ventral forearm  - these may recur and new lesions may form requiring treatment to prevent transformation into skin cancer - observe for new or changing spots and contact [facility] for appointment if occur - photoprotection with sun protective clothing; sunglasses and broad spectrum sunscreen with SPF of at least 30 + and frequent self skin exams recommended - yearly exams by a dermatologist recommended for persons with history of PreCancerous Actinic Keratoses  Return for 3 month tbse . I, Ruthell Rummage, CMA, am acting as scribe for Forest Gleason, MD.   Documentation: I have reviewed the  above documentation for accuracy and completeness, and I agree with the above.  Forest Gleason, MD

## 2020-10-10 ENCOUNTER — Encounter: Payer: Self-pay | Admitting: Dermatology

## 2020-11-29 ENCOUNTER — Ambulatory Visit (INDEPENDENT_AMBULATORY_CARE_PROVIDER_SITE_OTHER): Payer: Medicare Other | Admitting: Podiatry

## 2020-11-29 ENCOUNTER — Other Ambulatory Visit: Payer: Self-pay

## 2020-11-29 ENCOUNTER — Encounter: Payer: Self-pay | Admitting: Podiatry

## 2020-11-29 DIAGNOSIS — M79675 Pain in left toe(s): Secondary | ICD-10-CM

## 2020-11-29 DIAGNOSIS — M79674 Pain in right toe(s): Secondary | ICD-10-CM | POA: Diagnosis not present

## 2020-11-29 DIAGNOSIS — L989 Disorder of the skin and subcutaneous tissue, unspecified: Secondary | ICD-10-CM

## 2020-11-29 DIAGNOSIS — B351 Tinea unguium: Secondary | ICD-10-CM | POA: Diagnosis not present

## 2020-11-29 NOTE — Progress Notes (Signed)
SUBJECTIVE Patient presents to office today complaining of elongated, thickened nails that cause pain while ambulating in shoes.  He is unable to trim his own nails.  Patient also states that he developed symptomatic calluses to the bilateral forefoot.  He is even had surgery in the past to help resolve this issue which was unsuccessful.  He would like to have the calluses trimmed today.  Patient is here for further evaluation and treatment.  Past Medical History:  Diagnosis Date   Arthritis    osteo-right hip   Basal cell carcinoma 05/29/2020   right forehead, left axilla   Cancer ([place])    skin   Colon polyps    Dyspnea on exertion    a. 03/2015 Echo: Ef 60-65%, Gr1 DD, mild MR, nl RV fxn, mild PAH; b. 06/2015 RHC: minimal elevated PCWP; c.  04/2017 Echo: EF 60-65%, no rwma, GR1 DD, triv AI, mild MR, mildly dil LA, nl RV fxn, nl PASP.   Dysrhythmia    GERD (gastroesophageal reflux disease)    Hemorrhoids    Hiatal hernia    HOH (hard of hearing)    aides   Hypertension    IBS (irritable bowel syndrome)    Melanoma ([place]) 09/26/2012   vertex scalp. MM in situ.   Non-obstructive CAD (coronary artery disease)    a. 06/2015 Cath: LM 5, LAD 5ost, 30p, 29m, LCX nl, OM1 min irregs, OM2 nl, RCA 20p, 13m, RPDA/RPL1 min irregs, RPAV nl; b. 06/2017 Lexiscan MV: EF 55-65%, low risk.   PAF (paroxysmal atrial fibrillation) ([place])    a. Reported - no documentation in our records. Never seen on monitoring.   Paroxysmal supraventricular tachycardia ([place]) 03/2015   First documented during episode of influenza   Pulmonary embolism ([place])    a. Chronic eliquis.   PVC's (premature ventricular contractions)    a. 06/2017 24h Holter: Avg HR 67 (49-110), rare PACs up to 5 beat run, freq PVC's - 8% burden; b. 08/2017 Zio: Avg HR 64 (48-171), occas PACs & PVCs. 4 episodes of NSVT (4 beats longest). 48 episodes of SVT up to 12.4 secs, max rate 171.   Squamous cell carcinoma of skin 04/20/2014   Right 3rd finger.  WD SCC.     OBJECTIVE General Patient is awake, alert, and oriented x 3 and in no acute distress. Derm Skin is dry and supple bilateral. Negative open lesions or macerations. Remaining integument unremarkable. Nails are tender, long, thickened and dystrophic with subungual debris, consistent with onychomycosis, 1-5 bilateral. No signs of infection noted.  Hyperkeratotic preulcerative callus tissue also noted to the bilateral forefoot x4 Vasc  DP and PT pedal pulses palpable bilaterally. Temperature gradient within normal limits.  Neuro Epicritic and protective threshold sensation grossly intact bilaterally.  Musculoskeletal Exam No symptomatic pedal deformities noted bilateral. Muscular strength within normal limits.  ASSESSMENT 1.  Pain due to onychomycosis of toenail both 2.  Preulcerative symptomatic callus lesions bilateral feet 3. Pain in foot bilateral  PLAN OF CARE 1. Patient evaluated today.  2. Instructed to maintain good pedal hygiene and foot care.  3. Mechanical debridement of nails 1-5 bilaterally performed using a nail nipper. Filed with dremel without incident.  4.  Excisional debridement of the hyperkeratotic preulcerative callus tissue was performed using a chisel blade without incident or bleeding  5.  Return to clinic in 3 mos.   *Going to [employer] for Thanksgiving with his kids and grandkids.   Edrick Kins, DPM [facility]  Dr. Edrick Kins, DPM    [address], [place] [address]                Office [phone]  Fax [phone]

## 2021-01-02 ENCOUNTER — Encounter: Payer: Self-pay | Admitting: Urology

## 2021-01-02 ENCOUNTER — Ambulatory Visit (INDEPENDENT_AMBULATORY_CARE_PROVIDER_SITE_OTHER): Payer: Medicare Other | Admitting: Urology

## 2021-01-02 ENCOUNTER — Other Ambulatory Visit: Payer: Self-pay

## 2021-01-02 VITALS — BP 145/79 | HR 68 | Ht 71.0 in | Wt 225.0 lb

## 2021-01-02 DIAGNOSIS — N138 Other obstructive and reflux uropathy: Secondary | ICD-10-CM

## 2021-01-02 DIAGNOSIS — N401 Enlarged prostate with lower urinary tract symptoms: Secondary | ICD-10-CM | POA: Diagnosis not present

## 2021-01-02 LAB — BLADDER SCAN AMB NON-IMAGING: Scan Result: 14

## 2021-01-02 MED ORDER — FINASTERIDE 5 MG PO TABS
5.0000 mg | ORAL_TABLET | Freq: Every day | ORAL | 3 refills | Status: DC
Start: 2021-01-02 — End: 2021-12-26

## 2021-01-02 NOTE — Progress Notes (Signed)
01/02/2021 9:26 AM   [name] 1927/10/12 [id number]  Referring provider: Kirk Ruths, MD [facility] [facility] [place],  [place] [address]  Chief Complaint  Patient presents with   Benign Prostatic Hypertrophy    Urologic history: 1.  BPH with lower urinary tract symptoms -Finasteride 5 mg daily   2.  Elevated PSA -Biopsy 2006 PSA 6.2 with benign pathology -Discontinued PSA testing 2015 (uncorrected PSA 1.5)   HPI: 87 y.o. male presents for annual follow-up.  Doing well since last visit No bothersome LUTS Denies dysuria, gross hematuria Denies flank, abdominal or pelvic pain IPSS 8/35   PMH: Past Medical History:  Diagnosis Date   Arthritis    osteo-right hip   Basal cell carcinoma 05/29/2020   right forehead, left axilla, both [facility]'d with [facility] 06/25/2020   Cancer ([place])    skin   Colon polyps    Dyspnea on exertion    a. 03/2015 Echo: Ef 60-65%, Gr1 DD, mild MR, nl RV fxn, mild PAH; b. 06/2015 RHC: minimal elevated PCWP; c.  04/2017 Echo: EF 60-65%, no rwma, GR1 DD, triv AI, mild MR, mildly dil LA, nl RV fxn, nl PASP.   Dysrhythmia    GERD (gastroesophageal reflux disease)    Hemorrhoids    Hiatal hernia    HOH (hard of hearing)    aides   Hypertension    IBS (irritable bowel syndrome)    Melanoma ([place]) 09/26/2012   vertex scalp. MM in situ.   Non-obstructive CAD (coronary artery disease)    a. 06/2015 Cath: LM 5, LAD 5ost, 30p, 66m, LCX nl, OM1 min irregs, OM2 nl, RCA 20p, 44m, RPDA/RPL1 min irregs, RPAV nl; b. 06/2017 Lexiscan MV: EF 55-65%, low risk.   PAF (paroxysmal atrial fibrillation) ([place])    a. Reported - no documentation in our records. Never seen on monitoring.   Paroxysmal supraventricular tachycardia ([place]) 03/2015   First documented during episode of influenza   Pulmonary embolism ([place])    a. Chronic eliquis.   PVC's (premature ventricular contractions)    a. 06/2017 24h Holter: Avg HR 67 (49-110), rare PACs up to  5 beat run, freq PVC's - 8% burden; b. 08/2017 Zio: Avg HR 64 (48-171), occas PACs & PVCs. 4 episodes of NSVT (4 beats longest). 48 episodes of SVT up to 12.4 secs, max rate 171.   Squamous cell carcinoma of skin 04/20/2014   Right 3rd finger. WD SCC.     Surgical History: Past Surgical History:  Procedure Laterality Date   CARDIAC CATHETERIZATION N/A 06/28/2015   Procedure: Left Heart Cath and Coronary Angiography;  Surgeon: Leonie Man, MD;  Location: [place] [facility];  Service: Cardiovascular;  Laterality: N/A;; proximal and mid RCA focal 20-25% lesions. Mild diffuse calcifications in the left main and proximal LAD ~30%, LVEDP 18 mmHg   CARDIAC CATHETERIZATION  06/28/2015   Procedure: Right Heart Cath;  Surgeon: Leonie Man, MD;  Location: [place] [facility];  Service: Cardiovascular;; essentially normal right heart cath pressures. Cardiac Output 4.74.Normal wedge pressure roughly 12 mmHg.    COLON SURGERY  07/14/2013   small bowel obstruction   ESOPHAGOGASTRODUODENOSCOPY (EGD) WITH PROPOFOL N/A 08/26/2018   Procedure: ESOPHAGOGASTRODUODENOSCOPY (EGD) WITH PROPOFOL;  Surgeon: Jonathon Bellows, MD;  Location: [facility] ENDOSCOPY;  Service: Gastroenterology;  Laterality: N/A;   EYE SURGERY Bilateral    cataracts   HEMORROIDECTOMY     MASS EXCISION Left 03/03/2019   Procedure: EXCISION MASS;  Surgeon: [name], DO;  Location: [facility] ORS;  Service: General;  Laterality: Left;   SKIN SURGERY     TRANSTHORACIC ECHOCARDIOGRAM  04/10/2015   EF 60-65%. No RWMA, GR 1 DD. Mild PA pressure elevation of 38 mmHg.    Home Medications:  Allergies as of 01/02/2021   No Known Allergies      Medication List        Accurate as of January 02, 2021  9:26 AM. If you have any questions, ask your nurse or doctor.          apixaban 5 MG Tabs tablet Commonly known as: Eliquis Take 1 tablet (5 mg total) by mouth 2 (two) times daily.   finasteride 5 MG tablet Commonly known as: PROSCAR Take 1  tablet (5 mg total) by mouth daily.   furosemide 40 MG tablet Commonly known as: LASIX TAKE 1 TABLET BY MOUTH EVERY DAY   gabapentin 100 MG capsule Commonly known as: NEURONTIN Take 100 mg by mouth 3 (three) times daily.   nitroGLYCERIN 0.4 MG SL tablet Commonly known as: Nitrostat Place 1 tablet (0.4 mg total) under the tongue every 5 (five) minutes as needed for chest pain. Maximum of 3 doses.        Allergies: No Known Allergies  Family History: Family History  Problem Relation Age of Onset   CVA Father    CAD Father    Alzheimer's disease Mother    Heart Problems Sister    Heart attack Brother    CAD Brother    Prostate cancer Neg Hx    Bladder Cancer Neg Hx    Kidney cancer Neg Hx     Social History:  reports that he quit smoking about 47 years ago. His smoking use included cigarettes. He has a 50.00 pack-year smoking history. He has never used smokeless tobacco. He reports that he does not drink alcohol and does not use drugs.   Physical Exam: BP (!) 145/79    Pulse 68    Ht 5\' 11"  (1.803 m)    Wt 225 lb (102.1 kg)    BMI 31.38 kg/m   Constitutional:  Alert and oriented, No acute distress. HEENT: [place] AT, moist mucus membranes.  Trachea midline, no masses. Cardiovascular: No clubbing, cyanosis, or edema. Respiratory: Normal respiratory effort, no increased work of breathing. Psychiatric: Normal mood and affect.   Assessment & Plan:    1. BPH with obstruction/lower urinary tract symptoms Doing well Bladder scan PVR 14 mL Finasteride refilled Continue annual follow-up   [name], MD  [facility] [address] [place], [place] [address] [phone]

## 2021-01-22 ENCOUNTER — Ambulatory Visit (INDEPENDENT_AMBULATORY_CARE_PROVIDER_SITE_OTHER): Payer: Medicare Other | Admitting: Dermatology

## 2021-01-22 ENCOUNTER — Other Ambulatory Visit: Payer: Self-pay

## 2021-01-22 ENCOUNTER — Encounter: Payer: Self-pay | Admitting: Dermatology

## 2021-01-22 DIAGNOSIS — Z1283 Encounter for screening for malignant neoplasm of skin: Secondary | ICD-10-CM | POA: Diagnosis not present

## 2021-01-22 DIAGNOSIS — L578 Other skin changes due to chronic exposure to nonionizing radiation: Secondary | ICD-10-CM

## 2021-01-22 DIAGNOSIS — L814 Other melanin hyperpigmentation: Secondary | ICD-10-CM

## 2021-01-22 DIAGNOSIS — L820 Inflamed seborrheic keratosis: Secondary | ICD-10-CM

## 2021-01-22 DIAGNOSIS — L570 Actinic keratosis: Secondary | ICD-10-CM | POA: Diagnosis not present

## 2021-01-22 DIAGNOSIS — D229 Melanocytic nevi, unspecified: Secondary | ICD-10-CM

## 2021-01-22 DIAGNOSIS — L821 Other seborrheic keratosis: Secondary | ICD-10-CM

## 2021-01-22 DIAGNOSIS — L739 Follicular disorder, unspecified: Secondary | ICD-10-CM | POA: Diagnosis not present

## 2021-01-22 DIAGNOSIS — D1800 Hemangioma unspecified site: Secondary | ICD-10-CM

## 2021-01-22 DIAGNOSIS — Z85828 Personal history of other malignant neoplasm of skin: Secondary | ICD-10-CM

## 2021-01-22 MED ORDER — CLINDAMYCIN PHOS-BENZOYL PEROX 1-5 % EX GEL
Freq: Two times a day (BID) | CUTANEOUS | 2 refills | Status: DC
Start: 2021-01-22 — End: 2022-02-23

## 2021-01-22 NOTE — Progress Notes (Signed)
Follow-Up Visit   Subjective  Jordan Figueroa is a 88 y.o. male who presents for the following: Follow-up (Patient here today for 3 month follow up for tbse. Patient reports top of head has some rough spots. ).  The following portions of the chart were reviewed this encounter and updated as appropriate:  Tobacco   Allergies   Meds   Problems   Med Hx   Surg Hx   Fam Hx       Review of Systems: No other skin or systemic complaints except as noted in HPI or Assessment and Plan.   Objective  Well appearing patient in no apparent distress; mood and affect are within normal limits.  All skin waist up examined.  scalp x16 , right chest x 1 (17) Erythematous stuck-on, waxy papule or plaque  Scalp Perifollicular erythematous papules and pustules   Left Dorsal Hand x 1, right forehead x 1 (2) Erythematous thin papules/macules with gritty scale.    Assessment & Plan  Inflamed seborrheic keratosis (17) scalp x16 , right chest x 1  Irritated and itchy to patient   Prior to procedure, discussed risks of blister formation, small wound, skin dyspigmentation, or rare scar following cryotherapy. Recommend Vaseline ointment to treated areas while healing.   Destruction of lesion - scalp x16 , right chest x 1  Destruction method: cryotherapy   Informed consent: discussed and consent obtained   Lesion destroyed using liquid nitrogen: Yes   Cryotherapy cycles:  2 Outcome: patient tolerated procedure well with no complications   Post-procedure details: wound care instructions given   Additional details:  Prior to procedure, discussed risks of blister formation, small wound, skin dyspigmentation, or rare scar following cryotherapy. Recommend Vaseline ointment to treated areas while healing.   Folliculitis Scalp  Prior cultured showing cutibacterium acnes.   Restart  benzaclin 1-2 times daily to affected area at scalp as needed.   Benzoyl peroxide can cause dryness and irritation of the  skin. It can also bleach fabric. When used together with Aczone (dapsone) cream, it can stain the skin orange.  Related Medications clindamycin-benzoyl peroxide (BENZACLIN WITH PUMP) gel Apply topically 2 (two) times daily. Apply twice daily to affected areas at scalp as needed  Actinic keratosis (2) Left Dorsal Hand x 1, right forehead x 1  Actinic keratoses are precancerous spots that appear secondary to cumulative UV radiation exposure/sun exposure over time. They are chronic with expected duration over 1 year. A portion of actinic keratoses will progress to squamous cell carcinoma of the skin. It is not possible to reliably predict which spots will progress to skin cancer and so treatment is recommended to prevent development of skin cancer.  Recommend daily broad spectrum sunscreen SPF 30+ to sun-exposed areas, reapply every 2 hours as needed.  Recommend staying in the shade or wearing long sleeves, sun glasses (UVA+UVB protection) and wide brim hats (4-inch brim around the entire circumference of the hat). Call for new or changing lesions.  Prior to procedure, discussed risks of blister formation, small wound, skin dyspigmentation, or rare scar following cryotherapy. Recommend Vaseline ointment to treated areas while healing.   Destruction of lesion - Left Dorsal Hand x 1, right forehead x 1  Destruction method: cryotherapy   Informed consent: discussed and consent obtained   Lesion destroyed using liquid nitrogen: Yes   Cryotherapy cycles:  2 Outcome: patient tolerated procedure well with no complications   Post-procedure details: wound care instructions given   Additional details:  Prior  to procedure, discussed risks of blister formation, small wound, skin dyspigmentation, or rare scar following cryotherapy. Recommend Vaseline ointment to treated areas while healing.   Lentigines - Scattered tan macules - Due to sun exposure - Benign-appearing, observe - Recommend daily broad  spectrum sunscreen SPF 30+ to sun-exposed areas, reapply every 2 hours as needed. - Call for any changes  Seborrheic Keratoses - Stuck-on, waxy, tan-brown papules and/or plaques  - Benign-appearing - Discussed benign etiology and prognosis. - Observe - Call for any changes  Melanocytic Nevi - Tan-brown and/or pink-flesh-colored symmetric macules and papules - Benign appearing on exam today - Observation - Call clinic for new or changing moles - Recommend daily use of broad spectrum spf 30+ sunscreen to sun-exposed areas.   Hemangiomas - Red papules - Discussed benign nature - Observe - Call for any changes  Actinic Damage - Chronic condition, secondary to cumulative UV/sun exposure - diffuse scaly erythematous macules with underlying dyspigmentation - Recommend daily broad spectrum sunscreen SPF 30+ to sun-exposed areas, reapply every 2 hours as needed.  - Staying in the shade or wearing long sleeves, sun glasses (UVA+UVB protection) and wide brim hats (4-inch brim around the entire circumference of the hat) are also recommended for sun protection.  - Call for new or changing lesions.  History of Basal Cell Carcinoma of the Skin - No evidence of recurrence today at left axilla and right forehead 2022 - Recommend regular full body skin exams - Recommend daily broad spectrum sunscreen SPF 30+ to sun-exposed areas, reapply every 2 hours as needed.  - Call if any new or changing lesions are noted between office visits  History of Squamous Cell Carcinoma of the Skin - No evidence of recurrence today - No lymphadenopathy - Recommend regular full body skin exams - Recommend daily broad spectrum sunscreen SPF 30+ to sun-exposed areas, reapply every 2 hours as needed.  - Call if any new or changing lesions are noted between office visits  Skin cancer screening performed today. Return for 6 - 12 month upper body exam h/o of bcc . I, Ruthell Rummage, CMA, am acting as scribe for  Forest Gleason, MD.  Documentation: I have reviewed the above documentation for accuracy and completeness, and I agree with the above.  Forest Gleason, MD

## 2021-01-22 NOTE — Patient Instructions (Addendum)
Restart  benzaclin 1-2 times daily to affected area at scalp as needed.   Benzoyl peroxide can cause dryness and irritation of the skin. It can also bleach fabric. When used together with Aczone (dapsone) cream, it can stain the skin orange.  Actinic keratoses are precancerous spots that appear secondary to cumulative UV radiation exposure/sun exposure over time. They are chronic with expected duration over 1 year. A portion of actinic keratoses will progress to squamous cell carcinoma of the skin. It is not possible to reliably predict which spots will progress to skin cancer and so treatment is recommended to prevent development of skin cancer.  Recommend daily broad spectrum sunscreen SPF 30+ to sun-exposed areas, reapply every 2 hours as needed.  Recommend staying in the shade or wearing long sleeves, sun glasses (UVA+UVB protection) and wide brim hats (4-inch brim around the entire circumference of the hat). Call for new or changing lesions.   Cryotherapy Aftercare  Wash gently with soap and water everyday.   Apply Vaseline and Band-Aid daily until healed.   Seborrheic Keratosis  What causes seborrheic keratoses? Seborrheic keratoses are harmless, common skin growths that first appear during adult life.  As time goes by, more growths appear.  Some people may develop a large number of them.  Seborrheic keratoses appear on both covered and uncovered body parts.  They are not caused by sunlight.  The tendency to develop seborrheic keratoses can be inherited.  They vary in color from skin-colored to gray, brown, or even black.  They can be either smooth or have a rough, warty surface.   Seborrheic keratoses are superficial and look as if they were stuck on the skin.  Under the microscope this type of keratosis looks like layers upon layers of skin.  That is why at times the top layer may seem to fall off, but the rest of the growth remains and re-grows.    Treatment Seborrheic keratoses do not  need to be treated, but can easily be removed in the office.  Seborrheic keratoses often cause symptoms when they rub on clothing or jewelry.  Lesions can be in the way of shaving.  If they become inflamed, they can cause itching, soreness, or burning.  Removal of a seborrheic keratosis can be accomplished by freezing, burning, or surgery. If any spot bleeds, scabs, or grows rapidly, please return to have it checked, as these can be an indication of a skin cancer.   Melanoma ABCDEs  Melanoma is the most dangerous type of skin cancer, and is the leading cause of death from skin disease.  You are more likely to develop melanoma if you: Have light-colored skin, light-colored eyes, or red or blond hair Spend a lot of time in the sun Tan regularly, either outdoors or in a tanning bed Have had blistering sunburns, especially during childhood Have a close family member who has had a melanoma Have atypical moles or large birthmarks  Early detection of melanoma is key since treatment is typically straightforward and cure rates are extremely high if we catch it early.   The first sign of melanoma is often a change in a mole or a new dark spot.  The ABCDE system is a way of remembering the signs of melanoma.  A for asymmetry:  The two halves do not match. B for border:  The edges of the growth are irregular. C for color:  A mixture of colors are present instead of an even brown color. D for diameter:  Melanomas are usually (but not always) greater than 44mm - the size of a pencil eraser. E for evolution:  The spot keeps changing in size, shape, and color.  Please check your skin once per month between visits. You can use a small mirror in front and a large mirror behind you to keep an eye on the back side or your body.   If you see any new or changing lesions before your next follow-up, please call to schedule a visit.  Please continue daily skin protection including broad spectrum sunscreen SPF 30+  to sun-exposed areas, reapplying every 2 hours as needed when you're outdoors.   Staying in the shade or wearing long sleeves, sun glasses (UVA+UVB protection) and wide brim hats (4-inch brim around the entire circumference of the hat) are also recommended for sun protection.   If You Need Anything After Your Visit  If you have any questions or concerns for your doctor, please call our main line at [phone] and press option 4 to reach your doctor's medical assistant. If no one answers, please leave a voicemail as directed and we will return your call as soon as possible. Messages left after 4 pm will be answered the following business day.   You may also send [place] a message via [place]. We typically respond to MyChart messages within 1-2 business days.  For prescription refills, please ask your pharmacy to contact our office. Our fax number is [phone].  If you have an urgent issue when the clinic is closed that cannot wait until the next business day, you can page your doctor at the number below.    Please note that while we do our best to be available for urgent issues outside of office hours, we are not available 24/7.   If you have an urgent issue and are unable to reach [place], you may choose to seek medical care at your doctor's office, retail clinic, urgent care center, or emergency room.  If you have a medical emergency, please immediately call 911 or go to the emergency department.  Pager Numbers  - Dr. Nehemiah Massed: [phone]  - Dr. Laurence Ferrari: [phone]  - Dr. Nicole Kindred: [phone]  In the event of inclement weather, please call our main line at [phone] for an update on the status of any delays or closures.  Dermatology Medication Tips: Please keep the boxes that topical medications come in in order to help keep track of the instructions about where and how to use these. Pharmacies typically print the medication instructions only on the boxes and not directly on the  medication tubes.   If your medication is too expensive, please contact our office at [phone] option 4 or send [place] a message through [place].   We are unable to tell what your co-pay for medications will be in advance as this is different depending on your insurance coverage. However, we may be able to find a substitute medication at lower cost or fill out paperwork to get insurance to cover a needed medication.   If a prior authorization is required to get your medication covered by your insurance company, please allow [place] 1-2 business days to complete this process.  Drug prices often vary depending on where the prescription is filled and some pharmacies may offer cheaper prices.  The website www.goodrx.com contains coupons for medications through different pharmacies. The prices here do not account for what the cost may be with help from insurance (it may be cheaper with your insurance), but the website  can give you the price if you did not use any insurance.  - You can print the associated coupon and take it with your prescription to the pharmacy.  - You may also stop by our office during regular business hours and pick up a GoodRx coupon card.  - If you need your prescription sent electronically to a different pharmacy, notify our office through [facility] or by phone at [phone] option 4.     Si Usted Necesita Algo Despus de Su Visita  Tambin puede enviarnos un mensaje a travs de Pharmacist, community. Por lo general respondemos a los mensajes de MyChart en el transcurso de 1 a 2 das hbiles.  Para renovar recetas, por favor pida a su farmacia que se ponga en contacto con nuestra oficina. Harland Dingwall de fax es [place] [phone].  Si tiene un asunto urgente cuando la clnica est cerrada y que no puede esperar hasta el siguiente da hbil, puede llamar/localizar a su doctor(a) al nmero que aparece a continuacin.   Por favor, tenga en cuenta que aunque hacemos todo lo posible  para estar disponibles para asuntos urgentes fuera del horario de [place], no estamos disponibles las 24 horas del da, los 7 das de la [place].   Si tiene un problema urgente y no puede comunicarse con nosotros, puede optar por buscar atencin mdica  en el consultorio de su doctor(a), en una clnica privada, en un centro de atencin urgente o en una sala de emergencias.  Si tiene Engineering geologist, por favor llame inmediatamente al 911 o vaya a la sala de emergencias.  Nmeros de bper  - Dr. Nehemiah Massed: [phone]  - Dra. Moye: [phone]  - Dra. Nicole Kindred: [phone]  En caso de inclemencias del [place], por favor llame a Johnsie Kindred principal al [phone] para una actualizacin sobre el [place] de cualquier retraso o cierre.  Consejos para la medicacin en dermatologa: Por favor, guarde las cajas en las que vienen los medicamentos de uso tpico para ayudarle a seguir las instrucciones sobre dnde y cmo usarlos. Las farmacias generalmente imprimen las instrucciones del medicamento slo en las cajas y no directamente en los tubos del [place].   Si su medicamento es muy caro, por favor, pngase en contacto con Zigmund Daniel llamando al [phone] y presione la opcin 4 o envenos un mensaje a travs de Pharmacist, community.   No podemos decirle cul ser su copago por los medicamentos por adelantado ya que esto es diferente dependiendo de la cobertura de su seguro. Sin embargo, es posible que podamos encontrar un medicamento sustituto a Electrical engineer un formulario para que el seguro cubra el medicamento que se considera necesario.   Si se requiere una autorizacin previa para que su compaa de seguros [place] su medicamento, por favor permtanos de 1 a 2 das hbiles para completar este proceso.  Los precios de los medicamentos varan con frecuencia dependiendo del Environmental consultant de dnde se surte la receta y alguna farmacias pueden ofrecer precios ms baratos.  El sitio web  www.goodrx.com tiene cupones para medicamentos de Airline pilot. Los precios aqu no tienen en cuenta lo que podra costar con la ayuda del seguro (puede ser ms barato con su seguro), pero el sitio web puede darle el precio si no utiliz Research scientist (physical sciences).  - Puede imprimir el cupn correspondiente y llevarlo con su receta a la farmacia.  - Tambin puede pasar por nuestra oficina durante el horario de atencin regular y Charity fundraiser una tarjeta de cupones de GoodRx.  -  Si necesita que su receta se enve electrnicamente a Chiropodist, informe a nuestra oficina a travs de MyChart de [place] o por telfono llamando al [phone] y presione la opcin 4.

## 2021-02-28 ENCOUNTER — Ambulatory Visit (INDEPENDENT_AMBULATORY_CARE_PROVIDER_SITE_OTHER): Payer: Medicare Other | Admitting: Podiatry

## 2021-02-28 ENCOUNTER — Encounter: Payer: Self-pay | Admitting: Podiatry

## 2021-02-28 ENCOUNTER — Other Ambulatory Visit: Payer: Self-pay

## 2021-02-28 DIAGNOSIS — M79675 Pain in left toe(s): Secondary | ICD-10-CM | POA: Diagnosis not present

## 2021-02-28 DIAGNOSIS — L989 Disorder of the skin and subcutaneous tissue, unspecified: Secondary | ICD-10-CM

## 2021-02-28 DIAGNOSIS — M79674 Pain in right toe(s): Secondary | ICD-10-CM | POA: Diagnosis not present

## 2021-02-28 DIAGNOSIS — B351 Tinea unguium: Secondary | ICD-10-CM | POA: Diagnosis not present

## 2021-02-28 NOTE — Progress Notes (Signed)
SUBJECTIVE Patient presents to office today complaining of elongated, thickened nails that cause pain while ambulating in shoes.  He is unable to trim his own nails.  Patient also states that he developed symptomatic calluses to the bilateral forefoot.  He is even had surgery in the past to help resolve this issue which was unsuccessful.  He would like to have the calluses trimmed today.  Patient is here for further evaluation and treatment.  Past Medical History:  Diagnosis Date   Arthritis    osteo-right hip   Basal cell carcinoma 05/29/2020   right forehead, left axilla, both tx'd with [facility] 06/25/2020   Cancer ([place])    skin   Colon polyps    Dyspnea on exertion    a. 03/2015 Echo: Ef 60-65%, Gr1 DD, mild MR, nl RV fxn, mild PAH; b. 06/2015 RHC: minimal elevated PCWP; c.  04/2017 Echo: EF 60-65%, no rwma, GR1 DD, triv AI, mild MR, mildly dil LA, nl RV fxn, nl PASP.   Dysrhythmia    GERD (gastroesophageal reflux disease)    Hemorrhoids    Hiatal hernia    HOH (hard of hearing)    aides   Hypertension    IBS (irritable bowel syndrome)    Melanoma ([place]) 09/26/2012   vertex scalp. MM in situ.   Non-obstructive CAD (coronary artery disease)    a. 06/2015 Cath: LM 5, LAD 5ost, 30p, 20m, LCX nl, OM1 min irregs, OM2 nl, RCA 20p, 10m, RPDA/RPL1 min irregs, RPAV nl; b. 06/2017 Lexiscan MV: EF 55-65%, low risk.   PAF (paroxysmal atrial fibrillation) ([place])    a. Reported - no documentation in our records. Never seen on monitoring.   Paroxysmal supraventricular tachycardia ([place]) 03/2015   First documented during episode of influenza   Pulmonary embolism ([place])    a. Chronic eliquis.   PVC's (premature ventricular contractions)    a. 06/2017 24h Holter: Avg HR 67 (49-110), rare PACs up to 5 beat run, freq PVC's - 8% burden; b. 08/2017 Zio: Avg HR 64 (48-171), occas PACs & PVCs. 4 episodes of NSVT (4 beats longest). 48 episodes of SVT up to 12.4 secs, max rate 171.   Squamous cell carcinoma of skin  04/20/2014   Right 3rd finger. WD SCC.     OBJECTIVE General Patient is awake, alert, and oriented x 3 and in no acute distress. Derm Skin is dry and supple bilateral. Negative open lesions or macerations. Remaining integument unremarkable. Nails are tender, long, thickened and dystrophic with subungual debris, consistent with onychomycosis, 1-5 bilateral. No signs of infection noted.  Hyperkeratotic preulcerative callus tissue also noted to the bilateral forefoot x4 Vasc  DP and PT pedal pulses palpable bilaterally. Temperature gradient within normal limits.  Neuro Epicritic and protective threshold sensation grossly intact bilaterally.  Musculoskeletal Exam No symptomatic pedal deformities noted bilateral. Muscular strength within normal limits.  ASSESSMENT 1.  Pain due to onychomycosis of toenail both 2.  Preulcerative symptomatic callus lesions bilateral feet 3. Pain in foot bilateral  PLAN OF CARE 1. Patient evaluated today.  2. Instructed to maintain good pedal hygiene and foot care.  3. Mechanical debridement of nails 1-5 bilaterally performed using a nail nipper. Filed with dremel without incident.  4.  Excisional debridement of the hyperkeratotic preulcerative callus tissue was performed using a chisel blade without incident or bleeding  5.  Return to clinic in 3 mos.   *Wants to go on a trip to [place] with his daughter   Ruby Cola  Carver Fila, DPM [facility]  Dr. Edrick Kins, DPM    [address], [place] [address]                Office [phone]  Fax [phone]

## 2021-03-19 ENCOUNTER — Ambulatory Visit (INDEPENDENT_AMBULATORY_CARE_PROVIDER_SITE_OTHER): Payer: Medicare Other | Admitting: Internal Medicine

## 2021-03-19 ENCOUNTER — Other Ambulatory Visit: Payer: Self-pay

## 2021-03-19 ENCOUNTER — Encounter: Payer: Self-pay | Admitting: Internal Medicine

## 2021-03-19 VITALS — BP 130/58 | HR 72 | Ht 71.0 in | Wt 224.0 lb

## 2021-03-19 DIAGNOSIS — I480 Paroxysmal atrial fibrillation: Secondary | ICD-10-CM | POA: Diagnosis not present

## 2021-03-19 DIAGNOSIS — I2510 Atherosclerotic heart disease of native coronary artery without angina pectoris: Secondary | ICD-10-CM | POA: Diagnosis not present

## 2021-03-19 DIAGNOSIS — I5032 Chronic diastolic (congestive) heart failure: Secondary | ICD-10-CM

## 2021-03-19 DIAGNOSIS — I471 Supraventricular tachycardia: Secondary | ICD-10-CM | POA: Diagnosis not present

## 2021-03-19 NOTE — Patient Instructions (Signed)
Medication Instructions:  ? ?Your physician recommends that you continue on your current medications as directed. Please refer to the Current Medication list given to you today. ? ?*If you need a refill on your cardiac medications before your next appointment, please call your pharmacy* ? ? ?Lab Work: ? ?None ordered ? ?Testing/Procedures: ? ?None ordered ? ? ?Follow-Up: ?At [facility], you and your health needs are our priority.  As part of our continuing mission to provide you with exceptional heart care, we have created designated Provider Care Teams.  These Care Teams include your primary Cardiologist (physician) and Advanced Practice Providers (APPs -  Physician Assistants and Nurse Practitioners) who all work together to provide you with the care you need, when you need it. ? ?We recommend signing up for the patient portal called "MyChart".  Sign up information is provided on this After Visit Summary.  MyChart is used to connect with patients for Virtual Visits (Telemedicine).  Patients are able to view lab/test results, encounter notes, upcoming appointments, etc.  Non-urgent messages can be sent to your provider as well.   ?To learn more about what you can do with MyChart, go to [email or web address].   ? ?Your next appointment:   ?6 month(s) ? ?The format for your next appointment:   ?In Person ? ?Provider:   ?You may see Nelva Bush, MD or one of the following Advanced Practice Providers on your designated Care Team:   ?Murray Hodgkins, NP ?[name], PA-C ?Cadence Kathlen Mody, PA-C ?

## 2021-03-19 NOTE — Progress Notes (Signed)
? ?Follow-up Outpatient Visit ?Date: 03/19/2021 ? ?Primary Care Provider: ?Kirk Ruths, MD ?[address] ?[place] [address] ? ?Chief Complaint: Follow-up CAD and atrial fibrillation ? ?HPI:  Jordan Figueroa is a 88 y.o. male with history of non-obstructive coronary artery disease, pulmonary embolism, paroxysmal SVT, questionable paroxysmal atrial fibrillation, hiatal hernia, IBS, and GERD, who presents for follow-up of CAD and atrial fibrillation.  I last saw him in 09/2020, which time he was feeling well without further chest pain.  Chronic exertional dyspnea was stable.  No medication changes or additional testing was pursued. ? ?Today, Jordan Figueroa reports that he continues to feel very well.  He has stable mild exertional dyspnea that is unchanged.  He also notes stable asymmetric edema of his calves (left greater than right), which has been unchanged for years.  He is tolerating his medications well including apixaban and furosemide.  He has not had any falls.  He notes rare orthostatic lightheadedness.  He denies chest pain, palpitations, and orthopnea. ? ?-------------------------------------------------------------------------------------------------- ? ?Cardiovascular History & Procedures: ?Cardiovascular Problems: ?Non-obstructive CAD ?? Paroxysmal atrial fibrillation ?Paroxysmal SVT ?Pulmonary embolism ?  ?Risk Factors: ?Known CAD, age, and male gender ?  ?Cath/PCI: ?LHC/RHC (06/28/15): Mild diffuse disease involving the left coronary artery. 20-25% proximal and mid/distal RCA lesions. Mildly elevated left heart filling pressure. Normal right heart filling and pulmonary artery pressures. Normal LVEF with mildly reduced Fick cardiac output/index. ?  ?CV Surgery: ?None ?  ?EP Procedures and Devices: ?Event monitor (08/24/2017): Predominantly sinus rhythm with occasional PACs and PVCs.  48 episodes of PSVT up to 12.4 seconds. ?24-hour Holter monitor (06/22/2017): This rhythm  with frequent PVCs (8% burden) and rare PACs.  Single brief atrial run noted. ?  ?Non-Invasive Evaluation(s): ?Transthoracic echocardiogram (07/28/2018): Normal LV size and wall thickness.  LVEF 60-65% with grade 1 diastolic dysfunction.  Normal RV size and function.  No significant valvular abnormality. ?Pharmacologic MPI (04/12/2018): Low risk study without ischemia or scar.  LVEF calculated at 40%, likely artifactually low due to GI uptake. ?Transthoracic echocardiogram (04/29/2017): Normal LV size and LVEF (60 to 65%) with grade 1 diastolic dysfunction.  Trivial AI and mild MR.  Mild left atrial enlargement.  Normal RV contraction. ?Exercise myocardial perfusion stress test (06/07/15): Low risk scan without evidence of ischemia. Apical thinning noted. LVEF reduced at 40%, a likely artifactual. ?Transthoracic echocardiogram (04/10/15): Normal LV size and function with LVEF is 60-65%. Grade 1 diastolic dysfunction. Mild mitral regurgitation. Normal RV size and function. Mild pulmonary hypertension. ? ?Recent CV Pertinent Labs: ?Lab Results  ?Component Value Date  ? CHOL 109 07/28/2018  ? HDL 38 (L) 07/28/2018  ? [place] 61 07/28/2018  ? TRIG 48 07/28/2018  ? CHOLHDL 2.9 07/28/2018  ? INR 1.23 06/26/2015  ? BNP 87.0 04/10/2015  ? K 3.9 08/09/2018  ? K 4.1 07/17/2013  ? MG 2.3 08/09/2018  ? BUN 15 08/09/2018  ? BUN 9 07/17/2013  ? CREATININE 0.95 08/09/2018  ? CREATININE 0.92 07/17/2013  ? ? ?Past medical and surgical history were reviewed and updated in EPIC. ? ?Current Meds  ?Medication Sig  ? apixaban (ELIQUIS) 5 MG TABS tablet Take 1 tablet (5 mg total) by mouth 2 (two) times daily.  ? clindamycin-benzoyl peroxide (BENZACLIN WITH PUMP) gel Apply topically 2 (two) times daily. Apply twice daily to affected areas at scalp as needed  ? finasteride (PROSCAR) 5 MG tablet Take 1 tablet (5 mg total) by mouth daily.  ?  furosemide (LASIX) 40 MG tablet TAKE 1 TABLET BY MOUTH EVERY DAY  ? gabapentin (NEURONTIN) 100 MG capsule  Take 100 mg by mouth 3 (three) times daily.  ? nitroGLYCERIN (NITROSTAT) 0.4 MG SL tablet Place 1 tablet (0.4 mg total) under the tongue every 5 (five) minutes as needed for chest pain. Maximum of 3 doses.  ? ? ?Allergies: Patient has no known allergies. ? ?Social History  ? ?Tobacco Use  ? Smoking status: Former  ?  Packs/day: 2.00  ?  Years: 25.00  ?  Pack years: 50.00  ?  Types: Cigarettes  ?  Quit date: 02/25/1973  ?  Years since quitting: 48.0  ? Smokeless tobacco: Never  ?Vaping Use  ? Vaping Use: Never used  ?Substance Use Topics  ? Alcohol use: No  ?  Alcohol/week: 0.0 standard drinks  ? Drug use: Never  ? ? ?Family History  ?Problem Relation Age of Onset  ? CVA Father   ? CAD Father   ? Alzheimer's disease Mother   ? Heart Problems Sister   ? Heart attack Brother   ? CAD Brother   ? Prostate cancer Neg Hx   ? Bladder Cancer Neg Hx   ? Kidney cancer Neg Hx   ? ? ?Review of Systems: ?A 12-system review of systems was performed and was negative except as noted in the HPI. ? ?-------------------------------------------------------------------------------------------------- ? ?Physical Exam: ?BP (!) 130/58 (BP Location: Left Arm, Patient Position: Sitting, Cuff Size: Large)   Pulse 72   Ht '5\' 11"'$  (1.803 m)   Wt 224 lb (101.6 kg)   SpO2 98%   BMI 31.24 kg/m?  ? ?General:  NAD. ?Neck: No JVD or HJR. ?Lungs: Clear to auscultation bilaterally without wheezes or crackles. ?Heart: Regular rate and rhythm with occasional extrasystoles. ?Abdomen: Soft, nontender, nondistended. ?Extremities: 1+ left and trace right chronic appearing pretibial edema. ? ?EKG: Normal sinus rhythm with first-degree AV block, PACs, PVCs, and LAFB.  Compared with prior tracing from 09/18/2020, PVCs are now present.  Otherwise, no significant interval change. ? ?Lab Results  ?Component Value Date  ? WBC 7.7 08/09/2018  ? HGB 15.5 08/09/2018  ? HCT 45.8 08/09/2018  ? MCV 88 08/09/2018  ? PLT 253 08/09/2018  ? ? ?Lab Results  ?Component Value  Date  ? NA 141 08/09/2018  ? K 3.9 08/09/2018  ? CL 102 08/09/2018  ? CO2 26 08/09/2018  ? BUN 15 08/09/2018  ? CREATININE 0.95 08/09/2018  ? GLUCOSE 146 (H) 08/09/2018  ? ALT 22 04/10/2015  ? ? ?Lab Results  ?Component Value Date  ? CHOL 109 07/28/2018  ? HDL 38 (L) 07/28/2018  ? [place] 61 07/28/2018  ? TRIG 48 07/28/2018  ? CHOLHDL 2.9 07/28/2018  ? ? ?-------------------------------------------------------------------------------------------------- ? ?ASSESSMENT AND PLAN: ?Coronary artery disease: ?No angina reported.  Chronic exertional dyspnea is stable.  Defer adding a statin given mild CAD previously noted and excellent lipids off therapy. ? ?PSVT and PAF: ?No palpitations reported.  EKG today again shows sinus rhythm with first-degree AV block and multiple PACs and PVCs.  Apixaban can be continued at the discretion of Dr. Ouida Sills, given questionable history of atrial fibrillation in the past. ? ?Chronic HFpEF: ?Jordan Figueroa has stable chronic leg edema, left greater than right.  He has stable NYHA class II symptoms.  Continue furosemide 40 mg daily. ? ?Follow-up: Return to clinic in 6 months. ? ?Nelva Bush, MD ?03/19/2021 ?9:01 AM ? ?

## 2021-03-22 ENCOUNTER — Other Ambulatory Visit: Payer: Self-pay | Admitting: Internal Medicine

## 2021-06-03 ENCOUNTER — Ambulatory Visit (INDEPENDENT_AMBULATORY_CARE_PROVIDER_SITE_OTHER): Payer: Medicare Other | Admitting: Podiatry

## 2021-06-03 ENCOUNTER — Encounter: Payer: Self-pay | Admitting: Podiatry

## 2021-06-03 DIAGNOSIS — M79675 Pain in left toe(s): Secondary | ICD-10-CM | POA: Diagnosis not present

## 2021-06-03 DIAGNOSIS — M79674 Pain in right toe(s): Secondary | ICD-10-CM

## 2021-06-03 DIAGNOSIS — L989 Disorder of the skin and subcutaneous tissue, unspecified: Secondary | ICD-10-CM | POA: Diagnosis not present

## 2021-06-03 DIAGNOSIS — B351 Tinea unguium: Secondary | ICD-10-CM

## 2021-06-03 NOTE — Progress Notes (Signed)
 SUBJECTIVE Patient presents to office today complaining of elongated, thickened nails that cause pain while ambulating in shoes.  He is unable to trim his own nails.  Patient also states that he developed symptomatic calluses to the bilateral forefoot.  He is even had surgery in the past to help resolve this issue which was unsuccessful.  He would like to have the calluses trimmed today.  Patient is here for further evaluation and treatment.  Past Medical History:  Diagnosis Date   Arthritis    osteo-right hip   Basal cell carcinoma 05/29/2020   right forehead, left axilla, both tx'd with [facility] 06/25/2020   Cancer (HCC)    skin   Colon polyps    Dyspnea on exertion    a. 03/2015 Echo: Ef 60-65%, Gr1 DD, mild MR, nl RV fxn, mild PAH; b. 06/2015 RHC: minimal elevated PCWP; c.  04/2017 Echo: EF 60-65%, no rwma, GR1 DD, triv AI, mild MR, mildly dil LA, nl RV fxn, nl PASP.   Dysrhythmia    GERD (gastroesophageal reflux disease)    Hemorrhoids    Hiatal hernia    HOH (hard of hearing)    aides   Hypertension    IBS (irritable bowel syndrome)    Melanoma (HCC) 09/26/2012   vertex scalp. MM in situ.   Non-obstructive CAD (coronary artery disease)    a. 06/2015 Cath: LM 5, LAD 5ost, 30p, 10m, LCX nl, OM1 min irregs, OM2 nl, RCA 20p, 25m, RPDA/RPL1 min irregs, RPAV nl; b. 06/2017 Lexiscan MV: EF 55-65%, low risk.   PAF (paroxysmal atrial fibrillation) (HCC)    a. Reported - no documentation in our records. Never seen on monitoring.   Paroxysmal supraventricular tachycardia (HCC) 03/2015   First documented during episode of influenza   Pulmonary embolism (HCC)    a. Chronic eliquis.   PVC's (premature ventricular contractions)    a. 06/2017 24h Holter: Avg HR 67 (49-110), rare PACs up to 5 beat run, freq PVC's - 8% burden; b. 08/2017 Zio: Avg HR 64 (48-171), occas PACs & PVCs. 4 episodes of NSVT (4 beats longest). 48 episodes of SVT up to 12.4 secs, max rate 171.   Squamous cell carcinoma of skin  04/20/2014   Right 3rd finger. WD SCC.     OBJECTIVE General Patient is awake, alert, and oriented x 3 and in no acute distress. Derm Skin is dry and supple bilateral. Negative open lesions or macerations. Remaining integument unremarkable. Nails are tender, long, thickened and dystrophic with subungual debris, consistent with onychomycosis, 1-5 bilateral. No signs of infection noted.  Hyperkeratotic preulcerative callus tissue also noted to the bilateral forefoot x4 Vasc  DP and PT pedal pulses palpable bilaterally. Temperature gradient within normal limits.  Neuro Epicritic and protective threshold sensation grossly intact bilaterally.  Musculoskeletal Exam No symptomatic pedal deformities noted bilateral. Muscular strength within normal limits.  ASSESSMENT 1.  Pain due to onychomycosis of toenail both 2.  Preulcerative symptomatic callus lesions bilateral feet 3. Pain in foot bilateral  PLAN OF CARE 1. Patient evaluated today.  2. Instructed to maintain good pedal hygiene and foot care.  3. Mechanical debridement of nails 1-5 bilaterally performed using a nail nipper. Filed with dremel without incident.  4.  Excisional debridement of the hyperkeratotic preulcerative callus tissue was performed using a chisel blade without incident or bleeding  5.  Return to clinic in 3 mos.    Kodey Xue M. Kyelle Urbas, DPM [facility]  Dr. Florie Carico M.   Adaleah Forget, DPM    [address]                                    [place], [place] [address]                Office [phone]  Fax [phone]    

## 2021-09-09 ENCOUNTER — Ambulatory Visit (INDEPENDENT_AMBULATORY_CARE_PROVIDER_SITE_OTHER): Payer: Medicare Other | Admitting: Podiatry

## 2021-09-09 DIAGNOSIS — B351 Tinea unguium: Secondary | ICD-10-CM

## 2021-09-09 DIAGNOSIS — M79675 Pain in left toe(s): Secondary | ICD-10-CM | POA: Diagnosis not present

## 2021-09-09 DIAGNOSIS — M79674 Pain in right toe(s): Secondary | ICD-10-CM | POA: Diagnosis not present

## 2021-09-09 NOTE — Progress Notes (Signed)
SUBJECTIVE Patient presents to office today complaining of elongated, thickened nails that cause pain while ambulating in shoes.  He is unable to trim his own nails.  Patient also states that he developed symptomatic calluses to the bilateral forefoot.  He is even had surgery in the past to help resolve this issue which was unsuccessful.  He would like to have the calluses trimmed today.  Patient is here for further evaluation and treatment.  Past Medical History:  Diagnosis Date   Arthritis    osteo-right hip   Basal cell carcinoma 05/29/2020   right forehead, left axilla, both tx'd with [facility] 06/25/2020   Cancer ([place])    skin   Colon polyps    Dyspnea on exertion    a. 03/2015 Echo: Ef 60-65%, Gr1 DD, mild MR, nl RV fxn, mild PAH; b. 06/2015 RHC: minimal elevated PCWP; c.  04/2017 Echo: EF 60-65%, no rwma, GR1 DD, triv AI, mild MR, mildly dil LA, nl RV fxn, nl PASP.   Dysrhythmia    GERD (gastroesophageal reflux disease)    Hemorrhoids    Hiatal hernia    HOH (hard of hearing)    aides   Hypertension    IBS (irritable bowel syndrome)    Melanoma ([place]) 09/26/2012   vertex scalp. MM in situ.   Non-obstructive CAD (coronary artery disease)    a. 06/2015 Cath: LM 5, LAD 5ost, 30p, 77m LCX nl, OM1 min irregs, OM2 nl, RCA 20p, 240mRPDA/RPL1 min irregs, RPAV nl; b. 06/2017 Lexiscan MV: EF 55-65%, low risk.   PAF (paroxysmal atrial fibrillation) (HCSanta [place]   a. Reported - no documentation in our records. Never seen on monitoring.   Paroxysmal supraventricular tachycardia (HCNew[place]n03/2017   First documented during episode of influenza   Pulmonary embolism ([place]   a. Chronic eliquis.   PVC's (premature ventricular contractions)    a. 06/2017 24h Holter: Avg HR 67 (49-110), rare PACs up to 5 beat run, freq PVC's - 8% burden; b. 08/2017 Zio: Avg HR 64 (48-171), occas PACs & PVCs. 4 episodes of NSVT (4 beats longest). 48 episodes of SVT up to 12.4 secs, max rate 171.   Squamous cell carcinoma of skin  04/20/2014   Right 3rd finger. WD SCC.     OBJECTIVE General Patient is awake, alert, and oriented x 3 and in no acute distress. Derm Skin is dry and supple bilateral. Negative open lesions or macerations. Remaining integument unremarkable. Nails are tender, long, thickened and dystrophic with subungual debris, consistent with onychomycosis, 1-5 bilateral. No signs of infection noted.  Hyperkeratotic preulcerative callus tissue also noted to the bilateral forefoot x4 Vasc  DP and PT pedal pulses palpable bilaterally. Temperature gradient within normal limits.  Neuro Epicritic and protective threshold sensation grossly intact bilaterally.  Musculoskeletal Exam No symptomatic pedal deformities noted bilateral. Muscular strength within normal limits.  ASSESSMENT 1.  Pain due to onychomycosis of toenail both 2.  Preulcerative symptomatic callus lesions bilateral feet 3. Pain in foot bilateral  PLAN OF CARE 1. Patient evaluated today.  2. Instructed to maintain good pedal hygiene and foot care.  3. Mechanical debridement of nails 1-5 bilaterally performed using a nail nipper. Filed with dremel without incident.  4.  Excisional debridement of the hyperkeratotic preulcerative callus tissue was performed using a chisel blade without incident or bleeding  5.  Return to clinic in 3 mos.    BrEdrick KinsDPM [facility]  Dr. BrDorathy Daft  Amalia Hailey, DPM    [address], [place] [address]                Office [phone]  Fax [phone]

## 2021-09-18 ENCOUNTER — Other Ambulatory Visit: Payer: Self-pay | Admitting: Internal Medicine

## 2021-09-22 NOTE — Progress Notes (Unsigned)
Follow-up Outpatient Visit Date: 09/24/2021  Primary Care Provider: Kirk Ruths, MD [facility] [place] [address]  Chief Complaint: Short of breath  HPI:  Jordan Figueroa is a 88 y.o. male with history of non-obstructive coronary artery disease, pulmonary embolism, paroxysmal SVT, questionable paroxysmal atrial fibrillation, hiatal hernia, IBS, and GERD, who presents for follow-up of coronary artery disease and atrial fibrillation.  I last saw him in March, at which time he was feeling very well, noting stable mild exertional dyspnea.  We did not make any medication changes or pursue additional testing.  Today, Jordan Figueroa reports that he is feeling fairly well.  He is concerned about his balance at times and therefore is not walking very much.  He reports sporadic episodes of brief shortness of breath, usually when he is reading.  He feels like he is breathing rapidly for a second or 2 and then feels back to normal.  He has not had any chest pain, palpitations, or lightheadedness.  He notes some dependent leg edema that is well controlled with compression stockings and furosemide.  --------------------------------------------------------------------------------------------------  Cardiovascular History & Procedures: Cardiovascular Problems: Non-obstructive CAD ? Paroxysmal atrial fibrillation Paroxysmal SVT Pulmonary embolism   Risk Factors: Known CAD, age, and male gender   Cath/PCI: LHC/RHC (06/28/15): Mild diffuse disease involving the left coronary artery. 20-25% proximal and mid/distal RCA lesions. Mildly elevated left heart filling pressure. Normal right heart filling and pulmonary artery pressures. Normal LVEF with mildly reduced Fick cardiac output/index.   CV Surgery: None   EP Procedures and Devices: Event monitor (08/24/2017): Predominantly sinus rhythm with occasional PACs and PVCs.  48 episodes of PSVT up to 12.4 seconds. 24-hour  Holter monitor (06/22/2017): This rhythm with frequent PVCs (8% burden) and rare PACs.  Single brief atrial run noted.   Non-Invasive Evaluation(s): Transthoracic echocardiogram (07/28/2018): Normal LV size and wall thickness.  LVEF 60-65% with grade 1 diastolic dysfunction.  Normal RV size and function.  No significant valvular abnormality. Pharmacologic MPI (04/12/2018): Low risk study without ischemia or scar.  LVEF calculated at 40%, likely artifactually low due to GI uptake. Transthoracic echocardiogram (04/29/2017): Normal LV size and LVEF (60 to 65%) with grade 1 diastolic dysfunction.  Trivial AI and mild MR.  Mild left atrial enlargement.  Normal RV contraction. Exercise myocardial perfusion stress test (06/07/15): Low risk scan without evidence of ischemia. Apical thinning noted. LVEF reduced at 40%, a likely artifactual. Transthoracic echocardiogram (04/10/15): Normal LV size and function with LVEF is 60-65%. Grade 1 diastolic dysfunction. Mild mitral regurgitation. Normal RV size and function. Mild pulmonary hypertension.  Recent CV Pertinent Labs: Lab Results  Component Value Date   CHOL 109 07/28/2018   HDL 38 (L) 07/28/2018   LDLCALC 61 07/28/2018   TRIG 48 07/28/2018   CHOLHDL 2.9 07/28/2018   INR 1.23 06/26/2015   BNP 87.0 04/10/2015   K 3.9 08/09/2018   K 4.1 07/17/2013   MG 2.3 08/09/2018   BUN 15 08/09/2018   BUN 9 07/17/2013   CREATININE 0.95 08/09/2018   CREATININE 0.92 07/17/2013    Past medical and surgical history were reviewed and updated in EPIC.  Current Meds  Medication Sig   apixaban (ELIQUIS) 5 MG TABS tablet Take 1 tablet (5 mg total) by mouth 2 (two) times daily.   clindamycin-benzoyl peroxide (BENZACLIN WITH PUMP) gel Apply topically 2 (two) times daily. Apply twice daily to affected areas at scalp as needed   finasteride (PROSCAR)  5 MG tablet Take 1 tablet (5 mg total) by mouth daily.   furosemide (LASIX) 40 MG tablet TAKE 1 TABLET BY MOUTH EVERY DAY    gabapentin (NEURONTIN) 100 MG capsule Take 100 mg by mouth 3 (three) times daily.   nitroGLYCERIN (NITROSTAT) 0.4 MG SL tablet Place 1 tablet (0.4 mg total) under the tongue every 5 (five) minutes as needed for chest pain. Maximum of 3 doses.    Allergies: Patient has no known allergies.  Social History   Tobacco Use   Smoking status: Former    Packs/day: 2.00    Years: 25.00    Total pack years: 50.00    Types: Cigarettes    Quit date: 02/25/1973    Years since quitting: 48.6   Smokeless tobacco: Never  Vaping Use   Vaping Use: Never used  Substance Use Topics   Alcohol use: No    Alcohol/week: 0.0 standard drinks of alcohol   Drug use: Never    Family History  Problem Relation Age of Onset   CVA Father    CAD Father    Alzheimer's disease Mother    Heart Problems Sister    Heart attack Brother    CAD Brother    Prostate cancer Neg Hx    Bladder Cancer Neg Hx    Kidney cancer Neg Hx     Review of Systems: A 12-system review of systems was performed and was negative except as noted in the HPI.  --------------------------------------------------------------------------------------------------  Physical Exam: BP (!) 140/70 (BP Location: Left Arm, Patient Position: Sitting, Cuff Size: Large)   Pulse 70   Ht '5\' 10"'$  (1.778 m)   Wt 210 lb (95.3 kg)   SpO2 98%   BMI 30.13 kg/m   General:  NAD. Neck: No JVD or HJR. Lungs: Clear to auscultation bilaterally without wheezes or crackles. Heart: Bradycardic but regular without murmurs. Abdomen: Soft, nontender, nondistended. Extremities: 1+ pretibial edema, right greater than left.  EKG: Normal sinus rhythm with first-degree AV block, frequent PVCs and pattern of bigeminy, and bifascicular block (RBBB and LAFB).  Compared with prior tracing from 03/19/2021, PVCs are more frequent.  Otherwise, no significant interval change.  Lab Results  Component Value Date   WBC 7.7 08/09/2018   HGB 15.5 08/09/2018   HCT 45.8  08/09/2018   MCV 88 08/09/2018   PLT 253 08/09/2018    Lab Results  Component Value Date   NA 141 08/09/2018   K 3.9 08/09/2018   CL 102 08/09/2018   CO2 26 08/09/2018   BUN 15 08/09/2018   CREATININE 0.95 08/09/2018   GLUCOSE 146 (H) 08/09/2018   ALT 22 04/10/2015    Lab Results  Component Value Date   CHOL 109 07/28/2018   HDL 38 (L) 07/28/2018   LDLCALC 61 07/28/2018   TRIG 48 07/28/2018   CHOLHDL 2.9 07/28/2018    --------------------------------------------------------------------------------------------------  ASSESSMENT AND PLAN: Coronary artery disease: Jordan Figueroa does not complain of any chest pain but notes sporadic episodes of dyspnea at rest.  I am not convinced that this is due to worsening coronary insufficiency though frequent PVCs in the pattern of bigeminy are noted today.  We will obtain an echocardiogram to ensure that he has not developed a new cardiomyopathy.  If so, repeat ischemia evaluation will need to be considered.  PVCs, PSVT, and PAF: EKG today shows ventricular bigeminy.  Jordan Figueroa denies palpitations and lightheadedness.  We will place a 72-hour event monitor to quantify his PVC burden  as well as evaluate for other arrhythmias given his history of PSVT and PAF.  I am reluctant to add a beta-blocker or nondihydropyridine calcium channel blocker in the setting of his underlying conduction disease (trifascicular block).  I will check a BMP and magnesium level today to ensure that his electrolytes are not depleted and contributing to his ectopy.  Continue apixaban at the discretion of Dr. Ouida Sills given questionable history of atrial fibrillation.  Chronic HFpEF: Chronic lower extremity edema appears stable and is well controlled with furosemide and compression stocking use.  As above, given sporadic dyspnea as well as frequent PVCs on EKG today, we have agreed to obtain an echocardiogram.  Follow-up: Return to clinic in 6 weeks.  Nelva Bush,  MD 09/24/2021 8:57 AM

## 2021-09-24 ENCOUNTER — Ambulatory Visit: Payer: Medicare Other | Attending: Internal Medicine | Admitting: Internal Medicine

## 2021-09-24 ENCOUNTER — Other Ambulatory Visit
Admission: RE | Admit: 2021-09-24 | Discharge: 2021-09-24 | Disposition: A | Discharge status: Home / Self Care | Payer: Medicare Other | Attending: Internal Medicine | Admitting: Internal Medicine

## 2021-09-24 ENCOUNTER — Encounter: Payer: Self-pay | Admitting: Internal Medicine

## 2021-09-24 ENCOUNTER — Ambulatory Visit (INDEPENDENT_AMBULATORY_CARE_PROVIDER_SITE_OTHER): Payer: Medicare Other

## 2021-09-24 VITALS — BP 144/70 | HR 70 | Ht 70.0 in | Wt 210.0 lb

## 2021-09-24 DIAGNOSIS — I493 Ventricular premature depolarization: Secondary | ICD-10-CM

## 2021-09-24 DIAGNOSIS — R0602 Shortness of breath: Secondary | ICD-10-CM

## 2021-09-24 LAB — BASIC METABOLIC PANEL
Anion gap: 7 (ref 5–15)
BUN: 15 mg/dL (ref 8–23)
CO2: 26 mmol/L (ref 22–32)
Calcium: 8.8 mg/dL — ABNORMAL LOW (ref 8.9–10.3)
Chloride: 106 mmol/L (ref 98–111)
Creatinine, Ser: 1.03 mg/dL (ref 0.61–1.24)
GFR, Estimated: >60 mL/min (ref >60)
Glucose, Bld: 113 mg/dL — ABNORMAL HIGH (ref 70–99)
Potassium: 4 mmol/L (ref 3.5–5.1)
Sodium: 139 mmol/L (ref 135–145)

## 2021-09-24 LAB — MAGNESIUM: Magnesium: 2.3 mg/dL (ref 1.7–2.4)

## 2021-09-24 MED ORDER — NITROGLYCERIN 0.4 MG SL SUBL: 0.4000 mg | SUBLINGUAL_TABLET | SUBLINGUAL | 1 refills | Status: AC | PRN

## 2021-09-24 NOTE — Patient Instructions (Addendum)
Medication Instructions:   Your physician recommends that you continue on your current medications as directed. Please refer to the Current Medication list given to you today.  *If you need a refill on your cardiac medications before your next appointment, please call your pharmacy*   Lab Work:  __XX____Please go to the [facility] after your appointment today for a BMP, and Magnesium lab draw.   Testing/Procedures:   Your physician has requested that you have an echocardiogram. Echocardiography is a painless test that uses sound waves to create images of your heart. It provides your doctor with information about the size and shape of your heart and how well your heart's chambers and valves are working. This procedure takes approximately one hour. There are no restrictions for this procedure.  2.   Your physician has recommended that you wear a Zio XT monitor for 3 days. This will be mailed to your home address in 4-5 business days.   Your clinician has requested a Zio heart rhythm monitor by [employer] to be mailed to your home for you to wear for 14 days. You should expect a small box to arrive via USPS (or FedEx in some cases) within this next week. If you do not receive it please call [employer] at [phone].  Closely watching your heart at this time will help your care team understand more and provide information needed to develop your plan of care.  Please apply your Zio patch monitor the day you receive it. Keep this packaging, you will use this to return your Zio monitor.  You will easily be able to apply the monitor with the instructions provided in the Patient Guide.  If you need assistance, [employer] representatives are available 24/7 at [phone].  You can also download the [facility] app on your phone to view detailed application instructions and log symptoms.  After you wear your monitor for 3  days, place it back in the blue box or envelope, along with your Symptom  Log.  To send your monitor back: Simply use the pre-addressed and pre-paid box/envelope.  Send it back through [employer] the same day you remove it via your local post office or by placing it in your mailbox.  As soon as we receive the results, they will be reviewed and your clinician will contact you.  For the first 24 hours- it is essential to not shower or exercise, to allow the patch to adhere to your skin. Avoid excessive sweating to help maximize wear time. Do not submerge the device, no hot tubs, and no swimming pools. Keep any lotions or oils away from the patch. After 24 hours you may shower with the patch on. Take brief showers with your back facing the shower head.  Do not remove patch once it has been placed because that will interrupt data and decrease adhesive wear time. Push the button when you have any symptoms and write down what you were feeling. Once you have completed wearing your monitor, remove and place into box which has postage paid and place in your outgoing mailbox.  If for some reason you have misplaced your box then call our office and we can provide another box and/or mail it off for you.    Follow-Up: At [facility], you and your health needs are our priority.  As part of our continuing mission to provide you with exceptional heart care, we have created designated Provider Care Teams.  These Care Teams include your primary Cardiologist (physician) and  Advanced Practice Providers (APPs -  Physician Assistants and Nurse Practitioners) who all work together to provide you with the care you need, when you need it.  We recommend signing up for the patient portal called "MyChart".  Sign up information is provided on this After Visit Summary.  MyChart is used to connect with patients for Virtual Visits (Telemedicine).  Patients are able to view lab/test results, encounter notes, upcoming appointments, etc.  Non-urgent messages can be sent to your provider as  well.   To learn more about what you can do with MyChart, go to [email or web address].    Your next appointment:   6 week(s)  The format for your next appointment:   In Person  Provider:   You may see Nelva Bush, MD or one of the following Advanced Practice Providers on your designated Care Team:   Murray Hodgkins, NP [name], PA-C Cadence Kathlen Mody, PA-C Gerrie Nordmann, NP    Other Instructions   Important Information About Sugar

## 2021-09-26 DIAGNOSIS — I493 Ventricular premature depolarization: Secondary | ICD-10-CM

## 2021-09-26 NOTE — Addendum Note (Signed)
Addended by: Raelene Bott, Amil Bouwman L on: 09/26/2021 08:13 AM   Modules accepted: Orders

## 2021-10-09 ENCOUNTER — Ambulatory Visit (INDEPENDENT_AMBULATORY_CARE_PROVIDER_SITE_OTHER): Payer: Medicare Other | Admitting: Podiatry

## 2021-10-09 DIAGNOSIS — M7751 Other enthesopathy of right foot: Secondary | ICD-10-CM

## 2021-10-09 NOTE — Progress Notes (Signed)
Subjective:  Patient ID: Jordan Figueroa, male    DOB: March 08, 1927,  MRN: [id number]  Chief Complaint  Patient presents with   Injections    88 y.o. male presents with the above complaint.  Patient presents with ankle pain to the right side.  Patient states pain for touch is progressive gotten worse hurts with ambulation hurts with pressure he would like to do a steroid injection he denies any other acute complaints.  He is known to Dr. Amalia Hailey.  Pain scale 7 out of 10 hurts with ambulation hurts with pressure   Review of Systems: Negative except as noted in the HPI. Denies N/V/F/Ch.  Past Medical History:  Diagnosis Date   Arthritis    osteo-right hip   Basal cell carcinoma 05/29/2020   right forehead, left axilla, both tx'd with [facility] 06/25/2020   Cancer ([place])    skin   Colon polyps    Dyspnea on exertion    a. 03/2015 Echo: Ef 60-65%, Gr1 DD, mild MR, nl RV fxn, mild PAH; b. 06/2015 RHC: minimal elevated PCWP; c.  04/2017 Echo: EF 60-65%, no rwma, GR1 DD, triv AI, mild MR, mildly dil LA, nl RV fxn, nl PASP.   Dysrhythmia    GERD (gastroesophageal reflux disease)    Hemorrhoids    Hiatal hernia    HOH (hard of hearing)    aides   Hypertension    IBS (irritable bowel syndrome)    Melanoma ([place]) 09/26/2012   vertex scalp. MM in situ.   Non-obstructive CAD (coronary artery disease)    a. 06/2015 Cath: LM 5, LAD 5ost, 30p, 75m LCX nl, OM1 min irregs, OM2 nl, RCA 20p, 250mRPDA/RPL1 min irregs, RPAV nl; b. 06/2017 Lexiscan MV: EF 55-65%, low risk.   PAF (paroxysmal atrial fibrillation) ([place]   a. Reported - no documentation in our records. Never seen on monitoring.   Paroxysmal supraventricular tachycardia (HCIola03/2017   First documented during episode of influenza   Pulmonary embolism ([facility]   a. Chronic eliquis.   PVC's (premature ventricular contractions)    a. 06/2017 24h Holter: Avg HR 67 (49-110), rare PACs up to 5 beat run, freq PVC's - 8% burden; b. 08/2017 Zio: Avg HR 64  (48-171), occas PACs & PVCs. 4 episodes of NSVT (4 beats longest). 48 episodes of SVT up to 12.4 secs, max rate 171.   Squamous cell carcinoma of skin 04/20/2014   Right 3rd finger. WD SCC.     Current Outpatient Medications:    apixaban (ELIQUIS) 5 MG TABS tablet, Take 1 tablet (5 mg total) by mouth 2 (two) times daily., Disp: 90 tablet, Rfl: 3   clindamycin-benzoyl peroxide (BENZACLIN WITH PUMP) gel, Apply topically 2 (two) times daily. Apply twice daily to affected areas at scalp as needed, Disp: 50 g, Rfl: 2   finasteride (PROSCAR) 5 MG tablet, Take 1 tablet (5 mg total) by mouth daily., Disp: 90 tablet, Rfl: 3   furosemide (LASIX) 40 MG tablet, TAKE 1 TABLET BY MOUTH EVERY DAY, Disp: 90 tablet, Rfl: 0   gabapentin (NEURONTIN) 100 MG capsule, Take 100 mg by mouth 3 (three) times daily., Disp: , Rfl:    nitroGLYCERIN (NITROSTAT) 0.4 MG SL tablet, Place 1 tablet (0.4 mg total) under the tongue every 5 (five) minutes as needed for chest pain. Maximum of 3 doses., Disp: 25 tablet, Rfl: 1  Social History   Tobacco Use  Smoking Status Former   Packs/day: 2.00   Years: 25.00  Total pack years: 50.00   Types: Cigarettes   Quit date: 02/25/1973   Years since quitting: 48.6  Smokeless Tobacco Never    No Known Allergies Objective:  There were no vitals filed for this visit. There is no height or weight on file to calculate BMI. Constitutional Well developed. Well nourished.  Vascular Dorsalis pedis pulses palpable bilaterally. Posterior tibial pulses palpable bilaterally. Capillary refill normal to all digits.  No cyanosis or clubbing noted. Pedal hair growth normal.  Neurologic Normal speech. Oriented to person, place, and time. Epicritic sensation to light touch grossly present bilaterally.  Dermatologic Nails well groomed and normal in appearance. No open wounds. No skin lesions.  Orthopedic: Pain on palpation of right ankle joint pain with range of motion of the ankle joint  deep intra-articular ankle pain noted.  No pain at the peroneal tendon Achilles tendon ATFL ligament posterior tibial tendon   Radiographs: None Assessment:   1. Capsulitis of right ankle    Plan:  Patient was evaluated and treated and all questions answered.  Right ankle capsulitis -I will Russians and concerns were discussed with the patient in extensive detail -Given the amount of pain that he is having he will benefit from a steroid injection to help decrease the acute inflammatory component associate with pain.  Patient agrees with plan like to proceed with steroid injection -A steroid injection was performed at right ankle joint using 1% plain Lidocaine and 10 mg of Kenalog. This was well tolerated.   No follow-ups on file.

## 2021-10-10 ENCOUNTER — Encounter: Payer: Self-pay | Admitting: Urology

## 2021-10-17 ENCOUNTER — Telehealth: Payer: Self-pay | Admitting: *Deleted

## 2021-10-17 NOTE — Telephone Encounter (Signed)
Left voicemail message to call back for review of results.  

## 2021-10-17 NOTE — Telephone Encounter (Signed)
-----   Message from Nelva Bush, MD sent at 10/13/2021  2:21 PM EDT ----- Please let Jordan Figueroa know that his event monitor showed many extra beats and short runs of elevated heart rates that may explain some of the palpitations and shortness of breath that he described at our last office visit.  I recommend that he continue his current medications and proceed with the echocardiogram scheduled for next week (10/21/2021).

## 2021-10-20 ENCOUNTER — Telehealth: Payer: Self-pay | Admitting: Internal Medicine

## 2021-10-20 NOTE — Telephone Encounter (Signed)
Daughter called with patient to return RN's call.

## 2021-10-20 NOTE — Telephone Encounter (Signed)
Reviewed results and recommendations with patient and daughter. Confirmed upcoming appointments and they had no other questions at this time.     Redmond Pulling, Jasmin B routed conversation to [employer] Triage 33 minutes ago (8:51 AM)   Redmond Pulling, Jasmin B 33 minutes ago (8:51 AM)   JW Daughter called with patient to return RN's call.      Note    [place], [place] [phone]  Heloise Beecham

## 2021-10-20 NOTE — Telephone Encounter (Signed)
Duplicate encounter

## 2021-10-21 ENCOUNTER — Ambulatory Visit: Payer: Medicare Other | Attending: Internal Medicine

## 2021-10-21 DIAGNOSIS — R079 Chest pain, unspecified: Secondary | ICD-10-CM | POA: Diagnosis not present

## 2021-10-21 DIAGNOSIS — I4891 Unspecified atrial fibrillation: Secondary | ICD-10-CM | POA: Diagnosis not present

## 2021-10-21 DIAGNOSIS — I4719 Other supraventricular tachycardia: Secondary | ICD-10-CM | POA: Diagnosis not present

## 2021-10-21 DIAGNOSIS — I10 Essential (primary) hypertension: Secondary | ICD-10-CM | POA: Diagnosis not present

## 2021-10-21 DIAGNOSIS — Z86711 Personal history of pulmonary embolism: Secondary | ICD-10-CM | POA: Insufficient documentation

## 2021-10-21 DIAGNOSIS — Z87891 Personal history of nicotine dependence: Secondary | ICD-10-CM | POA: Diagnosis not present

## 2021-10-21 DIAGNOSIS — I493 Ventricular premature depolarization: Secondary | ICD-10-CM | POA: Insufficient documentation

## 2021-10-21 DIAGNOSIS — R0602 Shortness of breath: Secondary | ICD-10-CM | POA: Diagnosis not present

## 2021-10-21 DIAGNOSIS — I2510 Atherosclerotic heart disease of native coronary artery without angina pectoris: Secondary | ICD-10-CM | POA: Diagnosis not present

## 2021-10-21 LAB — ECHOCARDIOGRAM COMPLETE
AR max vel: 2.72 cm2
AV Area VTI: 2.63 cm2
AV Area mean vel: 2.6 cm2
AV Mean grad: 4 mmHg
AV Peak grad: 6.9 mmHg
AV Vena cont: 0.2 cm
Ao pk vel: 1.32 m/s
Area-P 1/2: 2.87 cm2
Calc EF: 66.6 %
S' Lateral: 3.2 cm
Single Plane A2C EF: 67.3 %
Single Plane A4C EF: 68.9 %

## 2021-10-23 ENCOUNTER — Telehealth: Payer: Self-pay | Admitting: *Deleted

## 2021-10-23 NOTE — Telephone Encounter (Signed)
-----   Message from Nelva Bush, MD sent at 10/22/2021  8:44 AM EDT ----- Please let Mr. Schoneman know that his echocardiogram shows that his heart is contracting well.  No significant valve abnormalities are noted.  Pressure inside his pulmonary arteries was mildly elevated, which can occasionally cause shortness of breath.  I recommend that he continue his current medications and follow-up with [place] as scheduled later this month to reassess his symptoms.  He should let us know if any questions or concerns arise in the meantime.

## 2021-10-23 NOTE — Telephone Encounter (Signed)
Laurine Blazer, LPN  27/12/9288  9:03 AM EDT Back to Top    Notified, copy to pcp.

## 2021-11-04 NOTE — Progress Notes (Unsigned)
Cardiology Clinic Note   Patient Name: Jordan Figueroa Date of Encounter: 11/05/2021  Primary Care Provider:  Kirk Ruths, MD Primary Cardiologist:  Nelva Bush, MD  Patient Profile    [name] is a 88 year-old male with a history of nonobstructive CAD, pulmonary embolism, PSVT, questionable PAF, and frequent PVCs who presents to the clinic today for 6-week follow-up of shortness of breath.  Past Medical History    Past Medical History:  Diagnosis Date   Arthritis    osteo-right hip   Basal cell carcinoma 05/29/2020   right forehead, left axilla, both tx'd with [facility] 06/25/2020   Cancer ([place])    skin   Colon polyps    Diastolic dysfunction    a. 03/2015 Echo: EF 60-65%, Gr1 DD, mild MR, nl RV fxn, mild PAH; b. 06/2015 RHC: minimal elevated PCWP; c.  04/2017 Echo: EF 60-65%, no rwma, GR1 DD, triv AI, mild MR, mildly dil LA, nl RV fxn, nl PASP; d. 10/2021 Echo: EF 60-65%, no rwma, GrI DD, nl RV fxn, RVSP 40.60mHg. Mildly dil LA, triv AI, Ao sclerosis. Ao root 364m   Dysrhythmia    GERD (gastroesophageal reflux disease)    Hemorrhoids    Hiatal hernia    HOH (hard of hearing)    aides   Hypertension    IBS (irritable bowel syndrome)    [facility]   vertex scalp. MM in situ.   Non-obstructive CAD (coronary artery disease)    a. 06/2015 Cath: LM 5, LAD 5ost, 30p, 1041mCX nl, OM1 min irregs, OM2 nl, RCA 20p, 55m11mDA/RPL1 min irregs, RPAV nl; b. 06/2017 Lexiscan MV: EF 55-65%, low risk.   PAF (paroxysmal atrial fibrillation) (HCC)[place] a. Reported - no documentation in our records. Never seen on monitoring.   Paroxysmal supraventricular tachycardia 03/2015   First documented during episode of influenza   Pulmonary embolism (HCC)[place] a. Chronic eliquis.   PVC's (premature ventricular contractions)    a. 06/2017 24h Holter: Avg HR 67 (49-110), rare PACs up to 5 beat run, freq PVC's - 8% burden; b. 08/2017 Zio: Avg HR 64 (48-171), occas PACs & PVCs. 4  episodes of NSVT (4 beats longest). 48 episodes of SVT up to 12.4 secs, max rate 171; c. 09/2021 Zio: predom RSR @ 65 (50-106), occas PACs (3.1%), freq PVCs (16.5%), 8 NSVT (longest 13 beats, max HR 197). 12 SVT runs (longest 5 beats, max HR 193).   Squamous cell carcinoma of skin 04/20/2014   Right 3rd finger. WD SCC.    Past Surgical History:  Procedure Laterality Date   CARDIAC CATHETERIZATION N/A 06/28/2015   Procedure: Left Heart Cath and Coronary Angiography;  Surgeon: DaviLeonie Man;  [facility];  Service: Cardiovascular;  Laterality: N/A;; proximal and mid RCA focal 20-25% lesions. Mild diffuse calcifications in the left main and proximal LAD ~30%, LVEDP 18 mmHg   CARDIAC CATHETERIZATION  06/28/2015   Procedure: Right Heart Cath;  Surgeon: DaviLeonie Man;  [facility];  Service: Cardiovascular;; essentially normal right heart cath pressures. Cardiac Output 4.74.Normal wedge pressure roughly 12 mmHg.    COLON SURGERY  07/14/2013   small bowel obstruction   ESOPHAGOGASTRODUODENOSCOPY (EGD) WITH PROPOFOL N/A 08/26/2018   Procedure: ESOPHAGOGASTRODUODENOSCOPY (EGD) WITH PROPOFOL;  Surgeon: AnnaJonathon Bellows;  [facility];  Service: Gastroenterology;  Laterality: N/A;   EYE SURGERY Bilateral    cataracts   HEMORROIDECTOMY  MASS EXCISION Left 03/03/2019   Procedure: EXCISION MASS;  Surgeon: [name], DO;  Location: [facility] ORS;  Service: General;  Laterality: Left;   SKIN SURGERY     TRANSTHORACIC ECHOCARDIOGRAM  04/10/2015   EF 60-65%. No RWMA, GR 1 DD. Mild PA pressure elevation of 38 mmHg.    Allergies  No Known Allergies  History of Present Illness    [name] has a past medical history of: Nonobstructive CAD. LHC/RHC 06/28/2015: Proximal and mid-distal RCA with focal 20-25% lesions, mild diffuse calcification left main and proximal LAD, proximal LAD 30% stenosis, mildly elevated LVEDP with high normal wedge pressure.   Nuclear stress test 04/12/2018: Normal wall motion, EF 40% (GI uptake artifact noted, possibly contributing to depressed EF), no EKG changes concerning for ischemia at peak stress or in recovery, rare PVC, low risk scan. Echo 10/21/2021: EF 60-65%, Grade I DD, mildly elevated pulmonary artery systolic pressure, left atrial size mildly dilated, trivial aortic valve regurgitation, aortic valve sclerosis/calcification, borderline dilatation of aortic root.  PVCs. EKG 09/24/2021: Ventricular bigeminy. More frequent PVCs when compared to prior EKG 03/19/2021. 72-hour cardiac monitor 10/03/2021: Predominantly sinus rhythm with occasional PAC's and frequent PVC's (16.5% burden). Several episodes of NSVT and PSVT also occurred.  PSVT. Questionable PAF.  Currently taking eliquis 5 mg bid.   He was last seen in the office by Dr. Saunders Revel on 09/24/2021.  At that time he reported brief episodes of shortness of breath while at rest lasting a few seconds before going back to normal.  Echo was ordered for shortness of breath and showed EF 60 to 65% and mildly elevated pulmonary artery systolic pressure. 72-hour cardiac monitor to quantify PVC burden revealed 16.5% burden and several episodes of NSVT and PSVT. BMP and magnesium ordered and were normal.  Today, patient is accompanied by his daughter.  He reports shortness of breath is unchanged since being seen by Dr. Saunders Revel in September.  Episodes are described as occasional with a brief feeling of needing to catch his breath for a few seconds and then going back to normal.  This can occur while he is sitting or walking.  He denies palpitations.  His daughter reports she has noticed over the course of several months that he seems more tired than usual requiring mid morning nap as well as an afternoon nap.  She also feels he cannot walk as much as he used to.  Patient states he does not feel more short of breath when he is up walking around.  He denies chest pain.  His edema is  stable and managed with compression stockings.  He denies blood in stool or urine.  Patient brings in BP log from September as requested by Dr. Saunders Revel:  9/14: 136/72 9/18: 143/78 9/19: 147/69 9/21: 127/65 9/22: 120/78 9/25: 116/73  Discussed results of echo and event monitor with patient and daughter.   Home Medications    Current Meds  Medication Sig   apixaban (ELIQUIS) 5 MG TABS tablet Take 1 tablet (5 mg total) by mouth 2 (two) times daily.   clindamycin-benzoyl peroxide (BENZACLIN WITH PUMP) gel Apply topically 2 (two) times daily. Apply twice daily to affected areas at scalp as needed   finasteride (PROSCAR) 5 MG tablet Take 1 tablet (5 mg total) by mouth daily.   furosemide (LASIX) 40 MG tablet TAKE 1 TABLET BY MOUTH EVERY DAY   gabapentin (NEURONTIN) 100 MG capsule Take 100 mg by mouth 3 (three) times daily.   nitroGLYCERIN (NITROSTAT) 0.4  MG SL tablet Place 1 tablet (0.4 mg total) under the tongue every 5 (five) minutes as needed for chest pain. Maximum of 3 doses.    Family History    Family History  Problem Relation Age of Onset   CVA Father    CAD Father    Alzheimer's disease Mother    Heart Problems Sister    Heart attack Brother    CAD Brother    Prostate cancer Neg Hx    Bladder Cancer Neg Hx    Kidney cancer Neg Hx    He indicated that his mother is deceased. He indicated that his father is deceased. He indicated that his sister is deceased. He indicated that his brother is deceased. He indicated that the status of his neg hx is unknown.   Social History    Social History   Socioeconomic History   Marital status: Widowed    Spouse name: Not on file   Number of children: Not on file   Years of education: Not on file   Highest education level: Not on file  Occupational History   Occupation: Banker  Tobacco Use   Smoking status: Former    Packs/day: 2.00    Years: 25.00    Total pack years: 50.00    Types: Cigarettes    Quit date:  02/25/1973    Years since quitting: 48.7   Smokeless tobacco: Never  Vaping Use   Vaping Use: Never used  Substance and Sexual Activity   Alcohol use: No    Alcohol/week: 0.0 standard drinks of alcohol   Drug use: Never   Sexual activity: Not Currently  Other Topics Concern   Not on file  Social History Narrative   Lives with daughter   Social Determinants of Health   Financial Resource Strain: Not on file  Food Insecurity: Not on file  Transportation Needs: Not on file  Physical Activity: Not on file  Stress: Not on file  Social Connections: Not on file  Intimate Partner Violence: Not on file     Review of Systems    General: No chills, fever, night sweats or weight changes.  Cardiovascular:  No chest pain. Positive for dyspnea on exertion. Trace edema bilateral lower extremities primarily in ankles. No orthopnea, palpitations, paroxysmal nocturnal dyspnea. Dermatological: No rash, lesions/masses Respiratory: No cough, dyspnea Urologic: No hematuria, dysuria Abdominal:   No nausea, vomiting, diarrhea, bright red blood per rectum, melena, or hematemesis Neurologic:  No visual changes, changes in mental status. All other systems reviewed and are otherwise negative except as noted above.  Physical Exam    VS:  BP 134/66 (BP Location: Left Arm, Patient Position: Sitting, Cuff Size: Normal)   Pulse 67   Ht '5\' 11"'$  (1.803 m)   Wt 208 lb 3.2 oz (94.4 kg)   SpO2 98%   BMI 29.04 kg/m  , BMI Body mass index is 29.04 kg/m. GEN:  Well nourished, well developed, in no acute distress. HEENT: Normal. Neck: Supple, no JVD, carotid bruits, or masses. Cardiac: RRR, no murmurs, rubs, or gallops. Occasional extrasystoles. No clubbing, cyanosis.  Radials/DP/PT 2+ and equal bilaterally.  Trace edema bilateral lower extremities with compression stockings in place. Respiratory:  Respirations regular and unlabored, clear to auscultation bilaterally. GI: Soft, nontender, nondistended. MS:  No deformity or atrophy. Skin: Warm and dry, no rash. Neuro:  Strength and sensation are intact. Psych: Normal affect.  Accessory Clinical Findings    The following studies were reviewed for this visit:  [facility]/RHC 06/28/2015: Conclusion: RCA - prox & mid-distal focal 20-25% lesions Mild diffuse calcification in the left main and proximal LAD system. Prox LAD lesion, 30% stenosed. Normal EF with mildly reduced cardiac output of 4.74 Mildly elevated LVEDP, with high normal wedge pressure.   Angiographically minimal coronary disease with preserved ejection fraction, mildly reduced cardiac output/index. Mostly normal right heart Pressures with the exception of mildly elevated PCWP and LVEDP   Very little data from this evaluation that could explain the patient's exertional dyspnea. Perhaps maybe mild diastolic dysfunction, but otherwise normal cardiac function and normal coronaries. Essentially right heart pressures with no evidence pulmonary hypertension.   Plan: Consider other noncardiac etiology Consider low-dose diuretic  Echo 10/21/21:  Impressions  1. Left ventricular ejection fraction, by estimation, is 60 to 65%. The  left ventricle has normal function. The left ventricle has no regional  wall motion abnormalities. Left ventricular diastolic parameters are  consistent with Grade I diastolic dysfunction (impaired relaxation).   2. Right ventricular systolic function is normal. The right ventricular  size is normal. There is mildly elevated pulmonary artery systolic  pressure. The estimated right ventricular systolic pressure is 60.7 mmHg.   3. Left atrial size was mildly dilated.   4. The mitral valve is normal in structure. No evidence of mitral valve  regurgitation. No evidence of mitral stenosis.   5. The aortic valve is normal in structure. Aortic valve regurgitation is  trivial. Aortic valve sclerosis/calcification is present, without any  evidence of aortic stenosis.  Aortic valve area, by VTI measures 2.63 cm.   6. There is borderline dilatation of the aortic root, measuring 38 mm.   7. The inferior vena cava is normal in size with greater than 50%  respiratory variability, suggesting right atrial pressure of 3 mmHg.   Comparison(s): 07/28/18-Normal LV size and wall thickness. LVEF 60-65% with  grade 1 diastolic dysfunction. Normal RV size and function. No significant  valvular abnormality.  72 hour Cardiac Monitor 10/03/2021: Study Highlights   The patient was monitored for 3 days.   The predominant rhythm was sinus with an average rate of 65 bpm (range 50-106 bpm in sinus).   There were occasional PAC's (3.1% burden) and frequent PVC's (16.5%).   Eight episodes of nonsustained ventricular tachycardia occurred, lasting up to 13 beats with a maximum rate of 197 bpm.   Twelve supraventricular runs occurred, lasting up ato 5 beats with a maximum rate of 193 bpm.   No sustained arrhythmia or prolonged pause occurred.   There were no patient triggered events.   Predominantly sinus rhythm with occasional PAC's and frequent PVC's, as detailed above.  Several episodes of NSVT and PSVT also occurred.    Recent Labs: 09/24/2021: BUN 15; Creatinine, Ser 1.03; Magnesium 2.3; Potassium 4.0; Sodium 139   Recent Lipid Panel    Component Value Date/Time   CHOL 109 07/28/2018 1438   TRIG 48 07/28/2018 1438   HDL 38 (L) 07/28/2018 1438   CHOLHDL 2.9 07/28/2018 1438   VLDL 10 07/28/2018 1438   LDLCALC 61 07/28/2018 1438         ECG personally reviewed by me today sinus rhythm with a first-degree AV block with PACs and PVCs. RBBB.  Previous EKG 09/24/2021 showed more frequent PVCs. }      Assessment & Plan   Shortness of breath.  Brief episodes of shortness of breath both at rest and with walking are unchanged since September.  Echo showed EF 60 to 65% and  mildly elevated pulmonary artery pressure.   Patient and daughter are comfortable with continuing  to monitor shortness of breath and fatigue.  Will check a CBC and TSH today to rule out underlying cause. PVCs/PSVT/NSVT and questionable PAF.  No palpitations reported. 72-hour event monitor showed 16.5% PVC burden several short runs of elevated heart rate.  EKG from today showed less PVCs than previous in September.  No reports of bleeding.  Continue Eliquis at current dose. CAD.  Patient denies chest pain or increased shortness of breath with exertion.  Daughter reports patient cannot walk as much as he used to.  Patient denies this is due to dyspnea or chest pain.  If if patient finds he is experiencing DOE or tolerance to activity decreases may consider ischemic work-up in the future. Chronic PE.  Continue Eliquis. Chronic HFpEF.  No complaints of increased lower extremity edema.  Continue Lasix at current dose and use of compression stockings. Hypertension.  BP 134/66 today.  Patient brings in log of home BPs from September.  Blood pressure is well controlled.   Disposition: Continue current medications.  CBC and TSH today.  Return in 3 months to see Dr. Saunders Revel or sooner as needed.   Justice [place]. Rejoice Heatwole, NP-C     11/05/2021, 9:52 AM [place] [address] Office [phone] Fax [phone]   I spent 15 minutes examining this patient, reviewing medications, and using patient centered shared decision making involving her cardiac care.  Prior to her visit I spent greater than 20 minutes reviewing her past medical history,  medications, and prior cardiac tests.

## 2021-11-05 ENCOUNTER — Encounter: Payer: Self-pay | Admitting: Nurse Practitioner

## 2021-11-05 ENCOUNTER — Other Ambulatory Visit
Admission: RE | Admit: 2021-11-05 | Discharge: 2021-11-05 | Disposition: A | Discharge status: Home / Self Care | Payer: Medicare Other | Attending: Nurse Practitioner | Admitting: Nurse Practitioner

## 2021-11-05 ENCOUNTER — Ambulatory Visit: Payer: Medicare Other | Attending: Nurse Practitioner | Admitting: Student

## 2021-11-05 VITALS — BP 134/66 | HR 67 | Ht 71.0 in | Wt 208.2 lb

## 2021-11-05 DIAGNOSIS — I5032 Chronic diastolic (congestive) heart failure: Secondary | ICD-10-CM | POA: Diagnosis present

## 2021-11-05 DIAGNOSIS — I10 Essential (primary) hypertension: Secondary | ICD-10-CM | POA: Insufficient documentation

## 2021-11-05 DIAGNOSIS — I480 Paroxysmal atrial fibrillation: Secondary | ICD-10-CM | POA: Insufficient documentation

## 2021-11-05 DIAGNOSIS — Z79899 Other long term (current) drug therapy: Secondary | ICD-10-CM | POA: Insufficient documentation

## 2021-11-05 DIAGNOSIS — I493 Ventricular premature depolarization: Secondary | ICD-10-CM | POA: Diagnosis not present

## 2021-11-05 DIAGNOSIS — I2510 Atherosclerotic heart disease of native coronary artery without angina pectoris: Secondary | ICD-10-CM | POA: Insufficient documentation

## 2021-11-05 DIAGNOSIS — I2782 Chronic pulmonary embolism: Secondary | ICD-10-CM | POA: Diagnosis present

## 2021-11-05 DIAGNOSIS — R0602 Shortness of breath: Secondary | ICD-10-CM | POA: Insufficient documentation

## 2021-11-05 DIAGNOSIS — I471 Supraventricular tachycardia, unspecified: Secondary | ICD-10-CM | POA: Insufficient documentation

## 2021-11-05 LAB — CBC
HCT: 47.9 % (ref 39.0–52.0)
Hemoglobin: 15.9 g/dL (ref 13.0–17.0)
MCH: 29.3 pg (ref 26.0–34.0)
MCHC: 33.2 g/dL (ref 30.0–36.0)
MCV: 88.4 fL (ref 80.0–100.0)
Platelets: 225 10*3/uL (ref 150–400)
RBC: 5.42 MIL/uL (ref 4.22–5.81)
RDW: 13.8 % (ref 11.5–15.5)
WBC: 7.4 10*3/uL (ref 4.0–10.5)
nRBC: 0 % (ref 0.0–0.2)

## 2021-11-05 LAB — TSH: TSH: 3.532 u[IU]/mL (ref 0.350–4.500)

## 2021-11-05 NOTE — Patient Instructions (Signed)
Medication Instructions:   *If you need a refill on your cardiac medications before your next appointment, please call your pharmacy*   Lab Work: CBC AND TSH TODAY   If you have labs (blood work) drawn today and your tests are completely normal, you will receive your results only by: [place] (if you have MyChart) OR A paper copy in the mail If you have any lab test that is abnormal or we need to change your treatment, we will call you to review the results.   Testing/Procedures:    Follow-Up: At [facility], you and your health needs are our priority.  As part of our continuing mission to provide you with exceptional heart care, we have created designated Provider Care Teams.  These Care Teams include your primary Cardiologist (physician) and Advanced Practice Providers (APPs -  Physician Assistants and Nurse Practitioners) who all work together to provide you with the care you need, when you need it.  We recommend signing up for the patient portal called "MyChart".  Sign up information is provided on this After Visit Summary.  MyChart is used to connect with patients for Virtual Visits (Telemedicine).  Patients are able to view lab/test results, encounter notes, upcoming appointments, etc.  Non-urgent messages can be sent to your provider as well.   To learn more about what you can do with MyChart, go to [email or web address].    Your next appointment:   3 month(s)  The format for your next appointment:   In Person  Provider:   Nelva Bush, MD    Other Instructions   Important Information About [place]

## 2021-12-15 ENCOUNTER — Other Ambulatory Visit: Payer: Self-pay | Admitting: Internal Medicine

## 2021-12-23 ENCOUNTER — Ambulatory Visit (INDEPENDENT_AMBULATORY_CARE_PROVIDER_SITE_OTHER): Payer: Medicare Other | Admitting: Podiatry

## 2021-12-23 DIAGNOSIS — M79675 Pain in left toe(s): Secondary | ICD-10-CM | POA: Diagnosis not present

## 2021-12-23 DIAGNOSIS — L989 Disorder of the skin and subcutaneous tissue, unspecified: Secondary | ICD-10-CM | POA: Diagnosis not present

## 2021-12-23 DIAGNOSIS — M7751 Other enthesopathy of right foot: Secondary | ICD-10-CM | POA: Diagnosis not present

## 2021-12-23 DIAGNOSIS — M79674 Pain in right toe(s): Secondary | ICD-10-CM

## 2021-12-23 DIAGNOSIS — B351 Tinea unguium: Secondary | ICD-10-CM

## 2021-12-23 MED ORDER — BETAMETHASONE SOD PHOS & ACET 6 (3-3) MG/ML IJ SUSP
3.0000 mg | Freq: Once | INTRAMUSCULAR | Status: AC
  Administered 2021-12-23: 3 mg via INTRA_ARTICULAR

## 2021-12-23 NOTE — Progress Notes (Signed)
Chief Complaint  Patient presents with   foot care    Patient is her for routine foot care, the patient  also states that he needs injection in right ankle for pain.    SUBJECTIVE Patient presents to office today complaining of elongated, thickened nails that cause pain while ambulating in shoes.  He is unable to trim his own nails.   He would like to have the calluses trimmed today.  Patient also complains of chronic low-grade arthritic ankle pain to the right ankle.  Denies a history of injury.  He says injections have helped in the past.  Patient is here for further evaluation and treatment.  Past Medical History:  Diagnosis Date   Arthritis    osteo-right hip   Basal cell carcinoma 05/29/2020   right forehead, left axilla, both tx'd with [facility] 06/25/2020   Cancer ([place])    skin   Colon polyps    Diastolic dysfunction    a. 03/2015 Echo: EF 60-65%, Gr1 DD, mild MR, nl RV fxn, mild PAH; b. 06/2015 RHC: minimal elevated PCWP; c.  04/2017 Echo: EF 60-65%, no rwma, GR1 DD, triv AI, mild MR, mildly dil LA, nl RV fxn, nl PASP; d. 10/2021 Echo: EF 60-65%, no rwma, GrI DD, nl RV fxn, RVSP 40.50mHg. Mildly dil LA, triv AI, Ao sclerosis. Ao root 38m   Dysrhythmia    GERD (gastroesophageal reflux disease)    Hemorrhoids    Hiatal hernia    HOH (hard of hearing)    aides   Hypertension    IBS (irritable bowel syndrome)    [facility] [place]   vertex scalp. MM in situ.   Non-obstructive CAD (coronary artery disease)    a. 06/2015 Cath: LM 5, LAD 5ost, 30p, 1064mCX nl, OM1 min irregs, OM2 nl, RCA 20p, 5m69mDA/RPL1 min irregs, RPAV nl; b. 06/2017 Lexiscan MV: EF 55-65%, low risk.   PAF (paroxysmal atrial fibrillation) (HCC)[place] a. Reported - no documentation in our records. Never seen on monitoring.   Paroxysmal supraventricular tachycardia 03/2015   First documented during episode of influenza   Pulmonary embolism (HCC)[place] a. Chronic eliquis.   PVC's (premature ventricular  contractions)    a. 06/2017 24h Holter: Avg HR 67 (49-110), rare PACs up to 5 beat run, freq PVC's - 8% burden; b. 08/2017 Zio: Avg HR 64 (48-171), occas PACs & PVCs. 4 episodes of NSVT (4 beats longest). 48 episodes of SVT up to 12.4 secs, max rate 171; c. 09/2021 Zio: predom RSR @ 65 (50-106), occas PACs (3.1%), freq PVCs (16.5%), 8 NSVT (longest 13 beats, max HR 197). 12 SVT runs (longest 5 beats, max HR 193).   Squamous cell carcinoma of skin 04/20/2014   Right 3rd finger. WD SCC.     OBJECTIVE General Patient is awake, alert, and oriented x 3 and in no acute distress. Derm Skin is dry and supple bilateral. Negative open lesions or macerations. Remaining integument unremarkable. Nails are tender, long, thickened and dystrophic with subungual debris, consistent with onychomycosis, 1-5 bilateral. No signs of infection noted.  Hyperkeratotic preulcerative callus tissue also noted to the bilateral forefoot x4 Vasc  DP and PT pedal pulses palpable bilaterally. Temperature gradient within normal limits.  Neuro Epicritic and protective threshold sensation grossly intact bilaterally.  Musculoskeletal Exam No symptomatic pedal deformities noted bilateral. Muscular strength within normal limits.  There is some tenderness with palpation to the lateral aspect of the right ankle joint  ASSESSMENT 1.  Pain due to onychomycosis of toenail both 2.  Preulcerative symptomatic callus lesions bilateral feet 3.  Capsulitis right ankle  PLAN OF CARE 1. Patient evaluated today.  2. Instructed to maintain good pedal hygiene and foot care.  3. Mechanical debridement of nails 1-5 bilaterally performed using a nail nipper. Filed with dremel without incident.  4.  Excisional debridement of the hyperkeratotic preulcerative callus tissue was performed using a chisel blade without incident or bleeding as a courtesy for the patient 5.  Injection of 0.5 cc Celestone Soluspan injected in the right ankle joint  6.  Return to  clinic in 3 mos.    Edrick Kins, DPM [facility]  Dr. Edrick Kins, DPM    [address], [place] [address]                Office [phone]  Fax [phone]

## 2021-12-26 ENCOUNTER — Other Ambulatory Visit: Payer: Self-pay | Admitting: Urology

## 2021-12-26 DIAGNOSIS — N401 Enlarged prostate with lower urinary tract symptoms: Secondary | ICD-10-CM

## 2022-01-02 ENCOUNTER — Ambulatory Visit: Payer: Medicare Other | Admitting: Urology

## 2022-01-07 ENCOUNTER — Ambulatory Visit: Payer: Medicare Other | Admitting: Urology

## 2022-01-08 ENCOUNTER — Ambulatory Visit: Payer: Medicare Other | Admitting: Urology

## 2022-01-14 ENCOUNTER — Ambulatory Visit (INDEPENDENT_AMBULATORY_CARE_PROVIDER_SITE_OTHER): Payer: Medicare Other | Admitting: Urology

## 2022-01-14 ENCOUNTER — Encounter: Payer: Self-pay | Admitting: Urology

## 2022-01-14 VITALS — BP 132/69 | HR 65 | Ht 71.0 in | Wt 200.0 lb

## 2022-01-14 DIAGNOSIS — N401 Enlarged prostate with lower urinary tract symptoms: Secondary | ICD-10-CM | POA: Diagnosis not present

## 2022-01-14 DIAGNOSIS — N138 Other obstructive and reflux uropathy: Secondary | ICD-10-CM

## 2022-01-14 LAB — BLADDER SCAN AMB NON-IMAGING: Scan Result: 52

## 2022-01-14 NOTE — Progress Notes (Signed)
01/14/2022 8:04 AM   Jordan Figueroa 1927-12-04 [id number]  Referring provider: Kirk Ruths, MD [facility] [place],  [place] [address]  Chief Complaint  Patient presents with   Benign Prostatic Hypertrophy    Urologic history: 1.  BPH with lower urinary tract symptoms -Finasteride 5 mg daily   2.  Elevated PSA -Biopsy 2006 PSA 6.2 with benign pathology -Discontinued PSA testing 2015 (uncorrected PSA 1.5)   HPI: 89 y.o. male presents for annual follow-up.  Doing well since last visit No bothersome LUTS Denies dysuria, gross hematuria Denies flank, abdominal or pelvic pain   PMH: Past Medical History:  Diagnosis Date   Arthritis    osteo-right hip   Basal cell carcinoma 05/29/2020   right forehead, left axilla, both tx'd with [facility] 06/25/2020   Cancer ([place])    skin   Colon polyps    Diastolic dysfunction    a. 03/2015 Echo: EF 60-65%, Gr1 DD, mild MR, nl RV fxn, mild PAH; b. 06/2015 RHC: minimal elevated PCWP; c.  04/2017 Echo: EF 60-65%, no rwma, GR1 DD, triv AI, mild MR, mildly dil LA, nl RV fxn, nl PASP; d. 10/2021 Echo: EF 60-65%, no rwma, GrI DD, nl RV fxn, RVSP 40.13mHg. Mildly dil LA, triv AI, Ao sclerosis. Ao root 341m   Dysrhythmia    GERD (gastroesophageal reflux disease)    Hemorrhoids    Hiatal hernia    HOH (hard of hearing)    aides   Hypertension    IBS (irritable bowel syndrome)    [place]   vertex scalp. MM in situ.   Non-obstructive CAD (coronary artery disease)    a. 06/2015 Cath: LM 5, LAD 5ost, 30p, 1067mCX nl, OM1 min irregs, OM2 nl, RCA 20p, 67m84mDA/RPL1 min irregs, RPAV nl; b. 06/2017 Lexiscan MV: EF 55-65%, low risk.   PAF (paroxysmal atrial fibrillation) (HCC)[place] a. Reported - no documentation in our records. Never seen on monitoring.   Paroxysmal supraventricular tachycardia 03/2015   First documented during episode of influenza   Pulmonary embolism (HCC)[place] a. Chronic eliquis.    PVC's (premature ventricular contractions)    a. 06/2017 24h Holter: Avg HR 67 (49-110), rare PACs up to 5 beat run, freq PVC's - 8% burden; b. 08/2017 Zio: Avg HR 64 (48-171), occas PACs & PVCs. 4 episodes of NSVT (4 beats longest). 48 episodes of SVT up to 12.4 secs, max rate 171; c. 09/2021 Zio: predom RSR @ 65 (50-106), occas PACs (3.1%), freq PVCs (16.5%), 8 NSVT (longest 13 beats, max HR 197). 12 SVT runs (longest 5 beats, max HR 193).   Squamous cell carcinoma of skin 04/20/2014   Right 3rd finger. WD SCC.     Surgical History: Past Surgical History:  Procedure Laterality Date   CARDIAC CATHETERIZATION N/A 06/28/2015   Procedure: Left Heart Cath and Coronary Angiography;  Surgeon: DaviLeonie Man;  [facility];  Service: Cardiovascular;  Laterality: N/A;; proximal and mid RCA focal 20-25% lesions. Mild diffuse calcifications in the left main and proximal LAD ~30%, LVEDP 18 mmHg   CARDIAC CATHETERIZATION  06/28/2015   Procedure: Right Heart Cath;  Surgeon: DaviLeonie Man;  [facility];  Service: Cardiovascular;; essentially normal right heart cath pressures. Cardiac Output 4.74.Normal wedge pressure roughly 12 mmHg.    COLON SURGERY  07/14/2013   small bowel obstruction   ESOPHAGOGASTRODUODENOSCOPY (EGD) WITH  PROPOFOL N/A 08/26/2018   Procedure: ESOPHAGOGASTRODUODENOSCOPY (EGD) WITH PROPOFOL;  Surgeon: Jonathon Bellows, MD;  Location[facility] END[facility]COPY;  Service: Gastroenterology;  Laterality: N/A;   EYE SURGERY Bilateral    cataracts   HEMORROIDECTOMY     MASS EXCISION Left 03/03/2019   Procedure: EXCISION MASS;  Surgeon: [name], DO;  Location: [facility] ORS;  Service: General;  Laterality: Left;   SKIN SURGERY     TRANSTHORACIC ECHOCARDIOGRAM  04/10/2015   EF 60-65%. No RWMA, GR 1 DD. Mild PA pressure elevation of 38 mmHg.    Home Medications:  Allergies as of 01/14/2022   No Known Allergies      Medication List        Accurate as of January 14, 2022  8:04 AM. If you have any questions, ask your nurse or doctor.          apixaban 5 MG Tabs tablet Commonly known as: Eliquis Take 1 tablet (5 mg total) by mouth 2 (two) times daily.   clindamycin-benzoyl peroxide gel Commonly known as: BenzaClin with Pump Apply topically 2 (two) times daily. Apply twice daily to affected areas at scalp as needed   finasteride 5 MG tablet Commonly known as: PROSCAR TAKE 1 TABLET (5 MG TOTAL) BY MOUTH DAILY.   furosemide 40 MG tablet Commonly known as: LASIX TAKE 1 TABLET BY MOUTH EVERY DAY   gabapentin 100 MG capsule Commonly known as: NEURONTIN Take 100 mg by mouth 3 (three) times daily.   nitroGLYCERIN 0.4 MG SL tablet Commonly known as: Nitrostat Place 1 tablet (0.4 mg total) under the tongue every 5 (five) minutes as needed for chest pain. Maximum of 3 doses.        Allergies: No Known Allergies  Family History: Family History  Problem Relation Age of Onset   CVA Father    CAD Father    Alzheimer's disease Mother    Heart Problems Sister    Heart attack Brother    CAD Brother    Prostate cancer Neg Hx    Bladder Cancer Neg Hx    Kidney cancer Neg Hx     Social History:  reports that he quit smoking about 48 years ago. His smoking use included cigarettes. He has a 50.00 pack-year smoking history. He has never used smokeless tobacco. He reports that he does not drink alcohol and does not use drugs.   Physical Exam: BP 132/69   Pulse 65   Ht '5\' 11"'$  (1.803 m)   Wt 200 lb (90.7 kg)   BMI 27.89 kg/m   Constitutional:  Alert and oriented, No acute distress. HEENT: [place] AT, moist mucus membranes.  Trachea midline, no masses. Cardiovascular: No clubbing, cyanosis, or edema. Respiratory: Normal respiratory effort, no increased work of breathing. Psychiatric: Normal mood and affect.   Assessment & Plan:    1. BPH with obstruction/lower urinary tract symptoms Doing well Bladder scan PVR 52 mL Finasteride refilled last  month Continue annual follow-up   [name], MD  [facility] [address] [place], [place] [address] [phone]

## 2022-01-22 ENCOUNTER — Ambulatory Visit (INDEPENDENT_AMBULATORY_CARE_PROVIDER_SITE_OTHER): Payer: Medicare Other | Admitting: Dermatology

## 2022-01-22 DIAGNOSIS — L720 Epidermal cyst: Secondary | ICD-10-CM | POA: Diagnosis not present

## 2022-01-22 DIAGNOSIS — Z85828 Personal history of other malignant neoplasm of skin: Secondary | ICD-10-CM

## 2022-01-22 DIAGNOSIS — Z86006 Personal history of melanoma in-situ: Secondary | ICD-10-CM

## 2022-01-22 DIAGNOSIS — Z1283 Encounter for screening for malignant neoplasm of skin: Secondary | ICD-10-CM | POA: Diagnosis not present

## 2022-01-22 DIAGNOSIS — L570 Actinic keratosis: Secondary | ICD-10-CM

## 2022-01-22 DIAGNOSIS — D229 Melanocytic nevi, unspecified: Secondary | ICD-10-CM

## 2022-01-22 DIAGNOSIS — L821 Other seborrheic keratosis: Secondary | ICD-10-CM

## 2022-01-22 DIAGNOSIS — L814 Other melanin hyperpigmentation: Secondary | ICD-10-CM

## 2022-01-22 DIAGNOSIS — L578 Other skin changes due to chronic exposure to nonionizing radiation: Secondary | ICD-10-CM

## 2022-01-22 NOTE — Progress Notes (Signed)
Follow-Up Visit   Subjective  Jordan Figueroa is a 89 y.o. male who presents for the following: UBSE (Hx Mmis, SCC, BCC).  The patient presents for Upper Body Skin Exam (UBSE) for skin cancer screening and mole check.  The patient has spots, moles and lesions to be evaluated, some may be new or changing and the patient has concerns that these could be cancer.  The following portions of the chart were reviewed this encounter and updated as appropriate:   Tobacco  Allergies  Meds  Problems  Med Hx  Surg Hx  Fam Hx      Review of Systems:  No other skin or systemic complaints except as noted in HPI or Assessment and Plan.  Objective  Well appearing patient in no apparent distress; mood and affect are within normal limits.  All skin waist up examined.  R forehead x 1, L forehead x 1, R dorsal hand x 2, R forearm x 2, L dorsal hand x 1 (7) Erythematous thin papules/macules with gritty scale.   Left Axilla Subcutaneous nodule.     Assessment & Plan  AK (actinic keratosis) (7) R forehead x 1, L forehead x 1, R dorsal hand x 2, R forearm x 2, L dorsal hand x 1  Actinic keratoses are precancerous spots that appear secondary to cumulative UV radiation exposure/sun exposure over time. They are chronic with expected duration over 1 year. A portion of actinic keratoses will progress to squamous cell carcinoma of the skin. It is not possible to reliably predict which spots will progress to skin cancer and so treatment is recommended to prevent development of skin cancer.  Recommend daily broad spectrum sunscreen SPF 30+ to sun-exposed areas, reapply every 2 hours as needed.  Recommend staying in the shade or wearing long sleeves, sun glasses (UVA+UVB protection) and wide brim hats (4-inch brim around the entire circumference of the hat). Call for new or changing lesions.  Prior to procedure, discussed risks of blister formation, small wound, skin dyspigmentation, or rare scar following  cryotherapy. Recommend Vaseline ointment to treated areas while healing.  Hypertrophic at right dorsal hand  Destruction of lesion - R forehead x 1, L forehead x 1, R dorsal hand x 2, R forearm x 2, L dorsal hand x 1  Destruction method: cryotherapy   Informed consent: discussed and consent obtained   Lesion destroyed using liquid nitrogen: Yes   Cryotherapy cycles:  2 Outcome: patient tolerated procedure well with no complications   Post-procedure details: wound care instructions given   Additional details:  Prior to procedure, discussed risks of blister formation, small wound, skin dyspigmentation, or rare scar following cryotherapy. Recommend Vaseline ointment to treated areas while healing.   Epidermal inclusion cyst Left Axilla  Benign-appearing. Exam most consistent with an epidermal inclusion cyst. Discussed that a cyst is a benign growth that can grow over time and sometimes get irritated or inflamed. Recommend observation if it is not bothersome. Discussed option of surgical excision to remove it if it is growing, symptomatic, or other changes noted. Please call for new or changing lesions so they can be evaluated.     History of Melanoma in Situ - No evidence of recurrence today at vertex scalp, 2014 - Recommend regular full body skin exams - Recommend daily broad spectrum sunscreen SPF 30+ to sun-exposed areas, reapply every 2 hours as needed.  - Call if any new or changing lesions are noted between office visits  History of Basal Cell Carcinoma  of the Skin - No evidence of recurrence today - Recommend regular full body skin exams - Recommend daily broad spectrum sunscreen SPF 30+ to sun-exposed areas, reapply every 2 hours as needed.  - Call if any new or changing lesions are noted between office visits  History of Squamous Cell Carcinoma of the Skin - No evidence of recurrence today - No lymphadenopathy - Recommend regular full body skin exams - Recommend daily  broad spectrum sunscreen SPF 30+ to sun-exposed areas, reapply every 2 hours as needed.  - Call if any new or changing lesions are noted between office visits  Lentigines - Scattered tan macules - Due to sun exposure - Benign-appearing, observe - Recommend daily broad spectrum sunscreen SPF 30+ to sun-exposed areas, reapply every 2 hours as needed. - Call for any changes  Seborrheic Keratoses - Stuck-on, waxy, tan-brown papules and/or plaques  - Benign-appearing - Discussed benign etiology and prognosis. - Observe - Call for any changes  Melanocytic Nevi - Tan-brown and/or pink-flesh-colored symmetric macules and papules - Benign appearing on exam today - Observation - Call clinic for new or changing moles - Recommend daily use of broad spectrum spf 30+ sunscreen to sun-exposed areas.   Hemangiomas - Red papules - Discussed benign nature - Observe - Call for any changes  Actinic Damage - Chronic condition, secondary to cumulative UV/sun exposure - diffuse scaly erythematous macules with underlying dyspigmentation - Recommend daily broad spectrum sunscreen SPF 30+ to sun-exposed areas, reapply every 2 hours as needed.  - Staying in the shade or wearing long sleeves, sun glasses (UVA+UVB protection) and wide brim hats (4-inch brim around the entire circumference of the hat) are also recommended for sun protection.  - Call for new or changing lesions.  Skin cancer screening performed today.  Return in about 1 year (around 01/23/2023) for UBSE, Hx SCC, Hx MMis, Hx BCC, Hx AK.  Graciella Belton, RMA, am acting as scribe for Forest Gleason, MD .  Documentation: I have reviewed the above documentation for accuracy and completeness, and I agree with the above.  Forest Gleason, MD

## 2022-01-22 NOTE — Patient Instructions (Signed)
Melanoma ABCDEs  Melanoma is the most dangerous type of skin cancer, and is the leading cause of death from skin disease.  You are more likely to develop melanoma if you: Have light-colored skin, light-colored eyes, or red or blond hair Spend a lot of time in the sun Tan regularly, either outdoors or in a tanning bed Have had blistering sunburns, especially during childhood Have a close family member who has had a melanoma Have atypical moles or large birthmarks  Early detection of melanoma is key since treatment is typically straightforward and cure rates are extremely high if we catch it early.   The first sign of melanoma is often a change in a mole or a new dark spot.  The ABCDE system is a way of remembering the signs of melanoma.  A for asymmetry:  The two halves do not match. B for border:  The edges of the growth are irregular. C for color:  A mixture of colors are present instead of an even brown color. D for diameter:  Melanomas are usually (but not always) greater than 6mm - the size of a pencil eraser. E for evolution:  The spot keeps changing in size, shape, and color.  Please check your skin once per month between visits. You can use a small mirror in front and a large mirror behind you to keep an eye on the back side or your body.   If you see any new or changing lesions before your next follow-up, please call to schedule a visit.  Please continue daily skin protection including broad spectrum sunscreen SPF 30+ to sun-exposed areas, reapplying every 2 hours as needed when you're outdoors.    Due to recent changes in healthcare laws, you may see results of your pathology and/or laboratory studies on MyChart before the doctors have had a chance to review them. We understand that in some cases there may be results that are confusing or concerning to you. Please understand that not all results are received at the same time and often the doctors may need to interpret multiple  results in order to provide you with the best plan of care or course of treatment. Therefore, we ask that you please give us 2 business days to thoroughly review all your results before contacting the office for clarification. Should we see a critical lab result, you will be contacted sooner.   If You Need Anything After Your Visit  If you have any questions or concerns for your doctor, please call our main line at [phone] and press option 4 to reach your doctor's medical assistant. If no one answers, please leave a voicemail as directed and we will return your call as soon as possible. Messages left after 4 pm will be answered the following business day.   You may also send us a message via MyChart. We typically respond to MyChart messages within 1-2 business days.  For prescription refills, please ask your pharmacy to contact our office. Our fax number is [phone].  If you have an urgent issue when the clinic is closed that cannot wait until the next business day, you can page your doctor at the number below.    Please note that while we do our best to be available for urgent issues outside of office hours, we are not available 24/7.   If you have an urgent issue and are unable to reach us, you may choose to seek medical care at your doctor's office, retail clinic, urgent care   center, or emergency room.  If you have a medical emergency, please immediately call 911 or go to the emergency department.  Pager Numbers  - Dr. Kowalski: [phone]  - Dr. Moye: [phone]  - Dr. Stewart: [phone]  In the event of inclement weather, please call our main line at [phone] for an update on the status of any delays or closures.  Dermatology Medication Tips: Please keep the boxes that topical medications come in in order to help keep track of the instructions about where and how to use these. Pharmacies typically print the medication instructions only on the boxes and not  directly on the medication tubes.   If your medication is too expensive, please contact our office at [phone] option 4 or send us a message through MyChart.   We are unable to tell what your co-pay for medications will be in advance as this is different depending on your insurance coverage. However, we may be able to find a substitute medication at lower cost or fill out paperwork to get insurance to cover a needed medication.   If a prior authorization is required to get your medication covered by your insurance company, please allow us 1-2 business days to complete this process.  Drug prices often vary depending on where the prescription is filled and some pharmacies may offer cheaper prices.  The website www.goodrx.com contains coupons for medications through different pharmacies. The prices here do not account for what the cost may be with help from insurance (it may be cheaper with your insurance), but the website can give you the price if you did not use any insurance.  - You can print the associated coupon and take it with your prescription to the pharmacy.  - You may also stop by our office during regular business hours and pick up a GoodRx coupon card.  - If you need your prescription sent electronically to a different pharmacy, notify our office through [facility] MyChart or by phone at [phone] option 4.     Si Usted Necesita Algo Despus de Su Visita  Tambin puede enviarnos un mensaje a travs de MyChart. Por lo general respondemos a los mensajes de MyChart en el transcurso de 1 a 2 das hbiles.  Para renovar recetas, por favor pida a su farmacia que se ponga en contacto con nuestra oficina. Nuestro nmero de fax es el [phone].  Si tiene un asunto urgente cuando la clnica est cerrada y que no puede esperar hasta el siguiente da hbil, puede llamar/localizar a su doctor(a) al nmero que aparece a continuacin.   Por favor, tenga en cuenta que aunque hacemos  todo lo posible para estar disponibles para asuntos urgentes fuera del horario de oficina, no estamos disponibles las 24 horas del da, los 7 das de la semana.   Si tiene un problema urgente y no puede comunicarse con nosotros, puede optar por buscar atencin mdica  en el consultorio de su doctor(a), en una clnica privada, en un centro de atencin urgente o en una sala de emergencias.  Si tiene una emergencia mdica, por favor llame inmediatamente al 911 o vaya a la sala de emergencias.  Nmeros de bper  - Dr. Kowalski: [phone]  - Dra. Moye: [phone]  - Dra. Stewart: [phone]  En caso de inclemencias del tiempo, por favor llame a nuestra lnea principal al [phone] para una actualizacin sobre el estado de cualquier retraso o cierre.  Consejos para la medicacin en dermatologa: Por favor, guarde las cajas en las   que vienen los medicamentos de uso tpico para ayudarle a seguir las instrucciones sobre dnde y cmo usarlos. Las farmacias generalmente imprimen las instrucciones del medicamento slo en las cajas y no directamente en los tubos del medicamento.   Si su medicamento es muy caro, por favor, pngase en contacto con nuestra oficina llamando al [phone] y presione la opcin 4 o envenos un mensaje a travs de MyChart.   No podemos decirle cul ser su copago por los medicamentos por adelantado ya que esto es diferente dependiendo de la cobertura de su seguro. Sin embargo, es posible que podamos encontrar un medicamento sustituto a menor costo o llenar un formulario para que el seguro cubra el medicamento que se considera necesario.   Si se requiere una autorizacin previa para que su compaa de seguros cubra su medicamento, por favor permtanos de 1 a 2 das hbiles para completar este proceso.  Los precios de los medicamentos varan con frecuencia dependiendo del lugar de dnde se surte la receta y alguna farmacias pueden ofrecer precios ms baratos.  El  sitio web www.goodrx.com tiene cupones para medicamentos de diferentes farmacias. Los precios aqu no tienen en cuenta lo que podra costar con la ayuda del seguro (puede ser ms barato con su seguro), pero el sitio web puede darle el precio si no utiliz ningn seguro.  - Puede imprimir el cupn correspondiente y llevarlo con su receta a la farmacia.  - Tambin puede pasar por nuestra oficina durante el horario de atencin regular y recoger una tarjeta de cupones de GoodRx.  - Si necesita que su receta se enve electrnicamente a una farmacia diferente, informe a nuestra oficina a travs de MyChart de [place] o por telfono llamando al [phone] y presione la opcin 4.  

## 2022-02-02 ENCOUNTER — Encounter: Payer: Self-pay | Admitting: Dermatology

## 2022-02-11 ENCOUNTER — Encounter: Payer: Self-pay | Admitting: Internal Medicine

## 2022-02-11 ENCOUNTER — Ambulatory Visit: Payer: Medicare Other | Attending: Internal Medicine | Admitting: Internal Medicine

## 2022-02-11 VITALS — BP 130/72 | HR 64 | Ht 71.0 in | Wt 215.0 lb

## 2022-02-11 DIAGNOSIS — I493 Ventricular premature depolarization: Secondary | ICD-10-CM | POA: Diagnosis present

## 2022-02-11 DIAGNOSIS — I471 Supraventricular tachycardia, unspecified: Secondary | ICD-10-CM | POA: Insufficient documentation

## 2022-02-11 DIAGNOSIS — I25119 Atherosclerotic heart disease of native coronary artery with unspecified angina pectoris: Secondary | ICD-10-CM | POA: Diagnosis not present

## 2022-02-11 DIAGNOSIS — I491 Atrial premature depolarization: Secondary | ICD-10-CM | POA: Diagnosis not present

## 2022-02-11 DIAGNOSIS — I5033 Acute on chronic diastolic (congestive) heart failure: Secondary | ICD-10-CM

## 2022-02-11 DIAGNOSIS — I480 Paroxysmal atrial fibrillation: Secondary | ICD-10-CM | POA: Diagnosis present

## 2022-02-11 NOTE — Patient Instructions (Signed)
Medication Instructions:  Your physician recommends the following medication changes.  INCREASE: Lasix to 40 mg by mouth twice a day for 4 days or until swelling has resolved.  Then decrease back to 40 mg daily (May take an extra 40 mg as needed for weight gain of 3 lbs overnight or 5 lbs in a week)   *If you need a refill on your cardiac medications before your next appointment, please call your pharmacy*   Lab Work: None ordered today   Testing/Procedures: None ordered today   Follow-Up: At [facility], you and your health needs are our priority.  As part of our continuing mission to provide you with exceptional heart care, we have created designated Provider Care Teams.  These Care Teams include your primary Cardiologist (physician) and Advanced Practice Providers (APPs -  Physician Assistants and Nurse Practitioners) who all work together to provide you with the care you need, when you need it.  We recommend signing up for the patient portal called "MyChart".  Sign up information is provided on this After Visit Summary.  MyChart is used to connect with patients for Virtual Visits (Telemedicine).  Patients are able to view lab/test results, encounter notes, upcoming appointments, etc.  Non-urgent messages can be sent to your provider as well.   To learn more about what you can do with MyChart, go to [email or web address].    Your next appointment:   1 month(s)  Provider:   You may see Nelva Bush, MD or one of the following Advanced Practice Providers on your designated Care Team:   Murray Hodgkins, NP [name], PA-C Cadence Kathlen Mody, PA-C Gerrie Nordmann, NP

## 2022-02-11 NOTE — Progress Notes (Signed)
Follow-up Outpatient Visit Date: 02/11/2022  Primary Care Provider: Kirk Ruths, MD [facility] [place] [address]  Chief Complaint: Chest pain and foot pain  HPI:  Jordan Figueroa is a 89 y.o. male with history of non-obstructive coronary artery disease, pulmonary embolism, paroxysmal SVT, questionable paroxysmal atrial fibrillation, hiatal hernia, IBS, and GERD, who presents for follow-up of coronary artery disease and atrial fibrillation.  I last saw him in mid September, at which time he was feeling fairly well though he was concerned about balance problems as well as transient tachypnea.  He was noted to have frequent PVCs.  Subsequent 3-day event monitor revealed a 16.5% PVC burden as well as 8 episodes of NSVT lasting up to 13 beats.  Echo showed normal LVEF (60-65%) with mild pulmonary hypertension.  Today, Jordan Figueroa reports that he has been feeling fairly well though he notes sporadic chest pains occurring about once a week.  They happen randomly and are not associated with exertion or other particular activities.  The pain typically lasts about 5 minutes before resolving spontaneously.  He is also bothered by calluses on both feet for which she sees podiatry.  Despite this, the calluses are painful and make it hard for him to walk.  His functional capacity is also limited by exertional dyspnea when walking extended distances.  He sometimes feels like he holds his breath when he is at rest, though he does not have frank shortness of breath with rest.  He notes some leg swelling, that typically is well-controlled with compression stockings.  He denies palpitations, syncope, or falls but has had occasional orthostatic lightheadedness.  He notes that his weight has been up, which she attributes to some dietary indiscretion.  He bruises easily but does not bleed, remaining compliant with  apixaban.  --------------------------------------------------------------------------------------------------  Cardiovascular History & Procedures: Cardiovascular Problems: Non-obstructive CAD ? Paroxysmal atrial fibrillation Paroxysmal SVT Pulmonary embolism   Risk Factors: Known CAD, age, and male gender   Cath/PCI: LHC/RHC (06/28/15): Mild diffuse disease involving the left coronary artery. 20-25% proximal and mid/distal RCA lesions. Mildly elevated left heart filling pressure. Normal right heart filling and pulmonary artery pressures. Normal LVEF with mildly reduced Fick cardiac output/index.   CV Surgery: None   EP Procedures and Devices: Event monitor (08/24/2017): Predominantly sinus rhythm with occasional PACs and PVCs.  48 episodes of PSVT up to 12.4 seconds. 24-hour Holter monitor (06/22/2017): This rhythm with frequent PVCs (8% burden) and rare PACs.  Single brief atrial run noted.   Non-Invasive Evaluation(s): Transthoracic echocardiogram (07/28/2018): Normal LV size and wall thickness.  LVEF 60-65% with grade 1 diastolic dysfunction.  Normal RV size and function.  No significant valvular abnormality. Pharmacologic MPI (04/12/2018): Low risk study without ischemia or scar.  LVEF calculated at 40%, likely artifactually low due to GI uptake. Transthoracic echocardiogram (04/29/2017): Normal LV size and LVEF (60 to 65%) with grade 1 diastolic dysfunction.  Trivial AI and mild MR.  Mild left atrial enlargement.  Normal RV contraction. Exercise myocardial perfusion stress test (06/07/15): Low risk scan without evidence of ischemia. Apical thinning noted. LVEF reduced at 40%, a likely artifactual. Transthoracic echocardiogram (04/10/15): Normal LV size and function with LVEF is 60-65%. Grade 1 diastolic dysfunction. Mild mitral regurgitation. Normal RV size and function. Mild pulmonary hypertension.  Recent CV Pertinent Labs: Lab Results  Component Value Date   CHOL 109 07/28/2018    HDL 38 (L) 07/28/2018   LDLCALC 61 07/28/2018  TRIG 48 07/28/2018   CHOLHDL 2.9 07/28/2018   INR 1.23 06/26/2015   BNP 87.0 04/10/2015   K 4.0 09/24/2021   K 4.1 07/17/2013   MG 2.3 09/24/2021   BUN 15 09/24/2021   BUN 15 08/09/2018   BUN 9 07/17/2013   CREATININE 1.03 09/24/2021   CREATININE 0.92 07/17/2013    Past medical and surgical history were reviewed and updated in EPIC.  Current Meds  Medication Sig   apixaban (ELIQUIS) 5 MG TABS tablet Take 1 tablet (5 mg total) by mouth 2 (two) times daily.   clindamycin-benzoyl peroxide (BENZACLIN WITH PUMP) gel Apply topically 2 (two) times daily. Apply twice daily to affected areas at scalp as needed   finasteride (PROSCAR) 5 MG tablet TAKE 1 TABLET (5 MG TOTAL) BY MOUTH DAILY.   furosemide (LASIX) 40 MG tablet TAKE 1 TABLET BY MOUTH EVERY DAY   gabapentin (NEURONTIN) 100 MG capsule Take 100 mg by mouth 3 (three) times daily.   nitroGLYCERIN (NITROSTAT) 0.4 MG SL tablet Place 1 tablet (0.4 mg total) under the tongue every 5 (five) minutes as needed for chest pain. Maximum of 3 doses.    Allergies: Patient has no known allergies.  Social History   Tobacco Use   Smoking status: Former    Packs/day: 2.00    Years: 25.00    Total pack years: 50.00    Types: Cigarettes    Quit date: 02/25/1973    Years since quitting: 48.9   Smokeless tobacco: Never  Vaping Use   Vaping Use: Never used  Substance Use Topics   Alcohol use: No    Alcohol/week: 0.0 standard drinks of alcohol   Drug use: Never    Family History  Problem Relation Age of Onset   CVA Father    CAD Father    Alzheimer's disease Mother    Heart Problems Sister    Heart attack Brother    CAD Brother    Prostate cancer Neg Hx    Bladder Cancer Neg Hx    Kidney cancer Neg Hx     Review of Systems: A 12-system review of systems was performed and was negative except as noted in the  HPI.  --------------------------------------------------------------------------------------------------  Physical Exam: BP 130/72 (BP Location: Left Arm, Patient Position: Sitting, Cuff Size: Normal)   Pulse 64   Ht '5\' 11"'$  (1.803 m)   Wt 215 lb (97.5 kg)   SpO2 98%   BMI 29.99 kg/m   General:  NAD. Neck: No JVD or HJR. Lungs: Clear to auscultation bilaterally without wheezes or crackles. Heart: Regular rate and rhythm with occasional extrasystoles.  No murmurs or rubs. Abdomen: Soft, nontender, nondistended. Extremities: 2+ pretibial edema bilaterally.  Pedal pulses are 2+ bilaterally.  Calluses noted on the plantar surface of both feet.  EKG: Normal sinus rhythm with PACs, first-degree AV block (PR interval 232 ms, previously 262 ms), right bundle branch block, and left anterior fascicular block (trifascicular block).  Compared to prior tracing from 11/05/2021, PR interval is slightly shorter.  Otherwise, no significant interval change.  Lab Results  Component Value Date   WBC 7.4 11/05/2021   HGB 15.9 11/05/2021   HCT 47.9 11/05/2021   MCV 88.4 11/05/2021   PLT 225 11/05/2021    Lab Results  Component Value Date   NA 139 09/24/2021   K 4.0 09/24/2021   CL 106 09/24/2021   CO2 26 09/24/2021   BUN 15 09/24/2021   CREATININE 1.03 09/24/2021   GLUCOSE  113 (H) 09/24/2021   ALT 22 04/10/2015    Outside lipid panel (02/07/2021): Total cholesterol 112, triglycerides 50, HDL 49, LDL 53  --------------------------------------------------------------------------------------------------  ASSESSMENT AND PLAN: Coronary artery disease: Jordan Figueroa complains of sporadic atypical chest pain today.  He was previously noted to have frequent PVCs on event monitoring with subsequent echo showing normal LVEF and no significant structural abnormalities.  We discussed repeat ischemia evaluation, though given his underlying conduction disease, frequent ectopy, and advanced age, testing  options are somewhat limited.  We will increase furosemide to see if decongestion helps improve his symptoms.  I will have him follow-up with [place] in a month.  If he continues to have chest pain, I would favor a pharmacologic MPI with close monitoring of his trifascicular block with regadenoson administration.  Continue apixaban and lieu of aspirin given history of questionable atrial fibrillation and VTE.  Defer statin therapy given excellent lipid profile on the last check by Jordan Figueroa a year ago.  Repeat labs are to be drawn through Dr. Tonette Bihari office next week.  Acute on chronic HFpEF: I worry that some exertional dyspnea, leg swelling, and weight gain could be due to diastolic heart failure with some element of deconditioning and venous insufficiency contributing as well.  I have recommended increasing furosemide to 80 mg daily for the next 3-4 days (longer if necessary to return to baseline weight and leg swelling).  Jordan Figueroa is scheduled for routine labs next week, at which time renal function and potassium should be followed up.  Temporary handicap lacquer to application has been completed, as Jordan Figueroa has NYHA class III symptoms and is unable to walk 200 feet without stopping due to his exertional dyspnea and leg pain from his foot calluses.  Paroxysmal atrial fibrillation, PACs, PVCs, and PSVT: Continue apixaban at the discretion of Jordan Figueroa given questionable history of atrial fibrillation.  Prior event monitor confirmed frequent PVCs and occasional PACs.  Low threshold for ischemia evaluation, as outlined above.  I am reluctant to add a beta-blocker or nondihydropyridine calcium channel blocker in the setting of Mr. Molesworth underlying trifascicular block.  May need to consider EP evaluation if he develops progressive symptoms or worsening conduction disease.  Follow-up: Return to clinic in 1 month.  Nelva Bush, MD 02/11/2022 9:35 AM

## 2022-02-20 ENCOUNTER — Other Ambulatory Visit: Payer: Self-pay | Admitting: Dermatology

## 2022-02-20 DIAGNOSIS — L739 Follicular disorder, unspecified: Secondary | ICD-10-CM

## 2022-02-26 ENCOUNTER — Other Ambulatory Visit (INDEPENDENT_AMBULATORY_CARE_PROVIDER_SITE_OTHER): Payer: Self-pay | Admitting: Nurse Practitioner

## 2022-02-26 DIAGNOSIS — I739 Peripheral vascular disease, unspecified: Secondary | ICD-10-CM

## 2022-02-27 ENCOUNTER — Ambulatory Visit (INDEPENDENT_AMBULATORY_CARE_PROVIDER_SITE_OTHER): Payer: Medicare Other

## 2022-02-27 ENCOUNTER — Encounter (INDEPENDENT_AMBULATORY_CARE_PROVIDER_SITE_OTHER): Payer: Self-pay | Admitting: Nurse Practitioner

## 2022-02-27 ENCOUNTER — Ambulatory Visit (INDEPENDENT_AMBULATORY_CARE_PROVIDER_SITE_OTHER): Payer: Medicare Other | Admitting: Nurse Practitioner

## 2022-02-27 ENCOUNTER — Other Ambulatory Visit (INDEPENDENT_AMBULATORY_CARE_PROVIDER_SITE_OTHER): Payer: Self-pay | Admitting: Nurse Practitioner

## 2022-02-27 VITALS — BP 123/71 | HR 76 | Resp 18 | Ht 71.0 in | Wt 212.0 lb

## 2022-02-27 DIAGNOSIS — M79606 Pain in leg, unspecified: Secondary | ICD-10-CM

## 2022-02-27 DIAGNOSIS — I739 Peripheral vascular disease, unspecified: Secondary | ICD-10-CM

## 2022-02-27 DIAGNOSIS — M79604 Pain in right leg: Secondary | ICD-10-CM | POA: Diagnosis not present

## 2022-02-27 DIAGNOSIS — I10 Essential (primary) hypertension: Secondary | ICD-10-CM | POA: Diagnosis not present

## 2022-02-27 DIAGNOSIS — M1611 Unilateral primary osteoarthritis, right hip: Secondary | ICD-10-CM | POA: Diagnosis not present

## 2022-03-01 ENCOUNTER — Encounter (INDEPENDENT_AMBULATORY_CARE_PROVIDER_SITE_OTHER): Payer: Self-pay | Admitting: Nurse Practitioner

## 2022-03-01 NOTE — Progress Notes (Signed)
Subjective:    Patient ID: Jordan Figueroa, male    DOB: [birth year], 89 y.o.   MRN: [id number] Chief Complaint  Patient presents with   Follow-up    Ultra sound Follow up  urgent - abi    Jordan Figueroa is a 89 year old male who presents today as a referral from his primary care provider Dr. Ouida Sills in regards to send claudication-like symptoms.  He notes that on Monday he began to have calf pain in his bilateral lower extremities.  He notes that he is never quite experienced a pain like this before.  He has barely been able to walk without having significant pain.  He notes that since that time the pain has actually slowly gotten better but he still experiences the pain in his calves.  Also concerning to the patient is the fact that his right lower extremity seems to be a little bit more swollen and tight.  Today the patient has a right ABI of 1.21 and a left of 1.18.  The patient has a TBI of 1.03 on the right and 1.03 on the left.  He has strong triphasic tibial artery waveforms bilaterally with normal toe waveforms bilaterally.  Additionally the patient underwent a DVT study which was negative for DVT.    Review of Systems  Cardiovascular:  Positive for leg swelling.  All other systems reviewed and are negative.      Objective:   Physical Exam Vitals reviewed.  HENT:     Head: Normocephalic.  Cardiovascular:     Rate and Rhythm: Normal rate.     Pulses:          Popliteal pulses are 1+ on the right side.       Dorsalis pedis pulses are 1+ on the right side and 1+ on the left side.       Posterior tibial pulses are 1+ on the right side and 1+ on the left side.  Pulmonary:     Effort: Pulmonary effort is normal.  Musculoskeletal:     Right lower leg: Edema present.  Skin:    General: Skin is warm and dry.  Neurological:     Mental Status: He is alert and oriented to person, place, and time.  Psychiatric:        Mood and Affect: Mood normal.        Behavior: Behavior  normal.        Thought Content: Thought content normal.        Judgment: Judgment normal.     BP 123/71 (BP Location: Left Arm)   Pulse 76   Resp 18   Ht 5' 11"$  (1.803 m)   Wt 212 lb (96.2 kg)   BMI 29.57 kg/m   Past Medical History:  Diagnosis Date   Arthritis    osteo-right hip   Basal cell carcinoma 05/29/2020   right forehead, left axilla, both tx'd with [facility] 06/25/2020   Cancer ([place])    skin   Colon polyps    Diastolic dysfunction    a. 03/2015 Echo: EF 60-65%, Gr1 DD, mild MR, nl RV fxn, mild PAH; b. 06/2015 RHC: minimal elevated PCWP; c.  04/2017 Echo: EF 60-65%, no rwma, GR1 DD, triv AI, mild MR, mildly dil LA, nl RV fxn, nl PASP; d. 10/2021 Echo: EF 60-65%, no rwma, GrI DD, nl RV fxn, RVSP 40.1mHg. Mildly dil LA, triv AI, Ao sclerosis. Ao root 390m   Dysrhythmia    GERD (gastroesophageal reflux disease)  Hemorrhoids    Hiatal hernia    HOH (hard of hearing)    aides   Hypertension    IBS (irritable bowel syndrome)    Melanoma ([place]) 09/26/2012   vertex scalp. MM in situ.   Non-obstructive CAD (coronary artery disease)    a. 06/2015 Cath: LM 5, LAD 5ost, 30p, 56m LCX nl, OM1 min irregs, OM2 nl, RCA 20p, 21mRPDA/RPL1 min irregs, RPAV nl; b. 06/2017 Lexiscan MV: EF 55-65%, low risk.   [place]   a. Reported - no documentation in our records. Never seen on monitoring.   Paroxysmal supraventricular tachycardia 03/2015   First documented during episode of influenza   Pulmonary embolism ([facility]   a. Chronic eliquis.   PVC's (premature ventricular contractions)    a. 06/2017 24h Holter: Avg HR 67 (49-110), rare PACs up to 5 beat run, freq PVC's - 8% burden; b. 08/2017 Zio: Avg HR 64 (48-171), occas PACs & PVCs. 4 episodes of NSVT (4 beats longest). 48 episodes of SVT up to 12.4 secs, max rate 171; c. 09/2021 Zio: predom RSR @ 65 (50-106), occas PACs (3.1%), freq PVCs (16.5%), 8 NSVT (longest 13 beats, max HR 197). 12 SVT runs (longest 5  beats, max HR 193).   Squamous cell carcinoma of skin 04/20/2014   Right 3rd finger. WD SCC.     Social History   Socioeconomic History   Marital status: Widowed    Spouse name: Not on file   Number of children: Not on file   Years of education: Not on file   Highest education level: Not on file  Occupational History   Occupation: waBankerTobacco Use   Smoking status: Former    Packs/day: 2.00    Years: 25.00    Total pack years: 50.00    Types: Cigarettes    Quit date: 02/25/1973    Years since quitting: 49.0   Smokeless tobacco: Never  Vaping Use   Vaping Use: Never used  Substance and Sexual Activity   Alcohol use: No    Alcohol/week: 0.0 standard drinks of alcohol   Drug use: Never   Sexual activity: Not Currently  Other Topics Concern   Not on file  Social History Narrative   Lives with daughter   Social Determinants of Health   Financial Resource Strain: Not on file  Food Insecurity: Not on file  Transportation Needs: Not on file  Physical Activity: Not on file  Stress: Not on file  Social Connections: Not on file  Intimate Partner Violence: Not on file    Past Surgical History:  Procedure Laterality Date   CARDIAC CATHETERIZATION N/A 06/28/2015   Procedure: Left Heart Cath and Coronary Angiography;  Surgeon: DaLeonie ManMD;  [facility];  Service: Cardiovascular;  Laterality: N/A;; proximal and mid RCA focal 20-25% lesions. Mild diffuse calcifications in the left main and proximal LAD ~30%, LVEDP 18 mmHg   CARDIAC CATHETERIZATION  06/28/2015   Procedure: Right Heart Cath;  Surgeon: DaLeonie ManMD;  [facility];  Service: Cardiovascular;; essentially normal right heart cath pressures. Cardiac Output 4.74.Normal wedge pressure roughly 12 mmHg.    COLON SURGERY  07/14/2013   small bowel obstruction   ESOPHAGOGASTRODUODENOSCOPY (EGD) WITH PROPOFOL N/A 08/26/2018   Procedure: ESOPHAGOGASTRODUODENOSCOPY (EGD)  WITH PROPOFOL;  Surgeon: AnJonathon BellowsMD;  [facility];  Service: Gastroenterology;  Laterality: N/A;   EYE SURGERY Bilateral  cataracts   HEMORROIDECTOMY     MASS EXCISION Left 03/03/2019   Procedure: EXCISION MASS;  Surgeon: [name], DO;  Location: [facility];  Service: General;  Laterality: Left;   SKIN SURGERY     TRANSTHORACIC ECHOCARDIOGRAM  04/10/2015   EF 60-65%. No RWMA, GR 1 DD. Mild PA pressure elevation of 38 mmHg.    Family History  Problem Relation Age of Onset   CVA Father    CAD Father    Alzheimer's disease Mother    Heart Problems Sister    Heart attack Brother    CAD Brother    Prostate cancer Neg Hx    Bladder Cancer Neg Hx    Kidney cancer Neg Hx     No Known Allergies     Latest Ref Rng & Units 11/05/2021   10:14 AM 08/09/2018   11:25 AM 07/28/2018    8:28 AM  CBC  WBC 4.0 - 10.5 K/uL 7.4  7.7  8.6   Hemoglobin 13.0 - 17.0 g/dL 15.9  15.5  15.4   Hematocrit 39.0 - 52.0 % 47.9  45.8  45.5   Platelets 150 - 400 K/uL 225  253  194       CMP     Component Value Date/Time   NA 139 09/24/2021 0954   NA 141 08/09/2018 1125   NA 137 07/17/2013 0451   K 4.0 09/24/2021 0954   K 4.1 07/17/2013 0451   CL 106 09/24/2021 0954   CL 104 07/17/2013 0451   CO2 26 09/24/2021 0954   CO2 27 07/17/2013 0451   GLUCOSE 113 (H) 09/24/2021 0954   GLUCOSE 111 (H) 07/17/2013 0451   BUN 15 09/24/2021 0954   BUN 15 08/09/2018 1125   BUN 9 07/17/2013 0451   CREATININE 1.03 09/24/2021 0954   CREATININE 0.92 07/17/2013 0451   CALCIUM 8.8 (L) 09/24/2021 0954   CALCIUM 7.5 (L) 07/17/2013 0451   PROT 7.2 04/10/2015 1356   PROT 7.0 07/14/2013 0930   ALBUMIN 4.1 04/10/2015 1356   ALBUMIN 3.5 07/14/2013 0930   AST 24 04/10/2015 1356   AST 16 07/14/2013 0930   ALT 22 04/10/2015 1356   ALT 20 07/14/2013 0930   ALKPHOS 73 04/10/2015 1356   ALKPHOS 105 07/14/2013 0930   BILITOT 0.6 04/10/2015 1356   BILITOT 0.4 07/14/2013 0930   GFRNONAA >60  09/24/2021 0954   GFRNONAA >60 07/17/2013 0451   GFRAA 82 08/09/2018 1125   GFRAA >60 07/17/2013 0451     No results found.     Assessment & Plan:   1. Claudication [facility]) The patient underwent noninvasive studies to evaluate for possible causes of the pain that he is recently been having.  Noninvasive studies show that there are no obvious arterial abnormalities.  We also did a bilateral lower extremity DVT study and that was negative as well.  Given the fact that the pain is slowly subsiding and improving I suspect this is more musculoskeletal in nature.  The patient will follow-up with his primary care physician for further workup and evaluation. - VAS [place] ABI WITH/WO TBI  2. Primary osteoarthritis of right hip Possible cause of the patient's lower extremity pain.  3. Essential hypertension Continue antihypertensive medications as already ordered, these medications have been reviewed and there are no changes at this time.   Current Outpatient Medications on File Prior to Visit  Medication Sig Dispense Refill   apixaban (ELIQUIS) 5 MG TABS tablet Take 1 tablet (5 mg  total) by mouth 2 (two) times daily. 90 tablet 3   clindamycin-benzoyl peroxide (BENZACLIN) gel APPLY TOPICALLY 2 (TWO) TIMES DAILY. APPLY TWICE DAILY TO AFFECTED AREAS AT SCALP AS NEEDED 50 g 2   finasteride (PROSCAR) 5 MG tablet TAKE 1 TABLET (5 MG TOTAL) BY MOUTH DAILY. 90 tablet 3   furosemide (LASIX) 40 MG tablet TAKE 1 TABLET BY MOUTH EVERY DAY 90 tablet 0   gabapentin (NEURONTIN) 100 MG capsule Take 100 mg by mouth 3 (three) times daily.     nitroGLYCERIN (NITROSTAT) 0.4 MG SL tablet Place 1 tablet (0.4 mg total) under the tongue every 5 (five) minutes as needed for chest pain. Maximum of 3 doses. 25 tablet 1   No current facility-administered medications on file prior to visit.    There are no Patient Instructions on file for this visit. No follow-ups on file.   Kris Hartmann, NP

## 2022-03-04 LAB — VAS US ABI WITH/WO TBI
Left ABI: 1.18
Right ABI: 1.21

## 2022-03-06 ENCOUNTER — Ambulatory Visit: Payer: Medicare Other | Attending: Internal Medicine | Admitting: Internal Medicine

## 2022-03-06 ENCOUNTER — Encounter: Payer: Self-pay | Admitting: Internal Medicine

## 2022-03-06 VITALS — BP 120/72 | HR 74 | Ht 69.0 in | Wt 209.0 lb

## 2022-03-06 DIAGNOSIS — I5033 Acute on chronic diastolic (congestive) heart failure: Secondary | ICD-10-CM | POA: Diagnosis present

## 2022-03-06 DIAGNOSIS — M79661 Pain in right lower leg: Secondary | ICD-10-CM | POA: Insufficient documentation

## 2022-03-06 DIAGNOSIS — I480 Paroxysmal atrial fibrillation: Secondary | ICD-10-CM | POA: Diagnosis present

## 2022-03-06 NOTE — Progress Notes (Signed)
Follow-up Outpatient Visit Date: 03/06/2022  Primary Care Provider: Kirk Ruths, MD [place] [address]  Chief Complaint: Follow-up coronary artery disease and HFpEF  HPI:  Jordan Figueroa is a 89 y.o. male with history of non-obstructive coronary artery disease, pulmonary embolism, paroxysmal SVT, questionable paroxysmal atrial fibrillation, hiatal hernia, IBS, and GERD, who presents for follow-up of chest pain.  I last saw him on 02/11/2022, at which time he complained of sporadic nonexertional chest pain happening about once a week.  He also complained of pain in both feet that he attributed to calluses.  There was concern that an element of acute on chronic HFpEF may be contributing to his chest discomfort as well as exertional dyspnea.  We agreed to increase furosemide to 40 mg twice daily for 4 days.  Ischemia evaluation was deferred.  He was evaluated at [facility] clinic about 10 days later complaining of worsening cough.  He was empirically treated for community-acquired pneumonia with levofloxacin and Tessalon Perles.  Today, Jordan Figueroa reports that he is feeling better.  His breathing is back to baseline and his leg swelling had resolved.  Few days after completing his course of levofloxacin, he developed sudden pain in the right calf while walking to his ENT appointment.  The right calf has been a little bit swollen since then.  Dr. Ouida Sills ordered lower extremity arterial and venous vascular studies, which showed normal arterial perfusion and absence of DVT.  Swelling and pain are improving but have not completely resolved.  His family wonders if he may have had a cramp related to dehydration.  He is back to taking furosemide 40 mg once daily.  --------------------------------------------------------------------------------------------------  Cardiovascular History & Procedures: Cardiovascular Problems: Non-obstructive CAD ?  Paroxysmal atrial fibrillation Paroxysmal SVT Pulmonary embolism   Risk Factors: Known CAD, age, and male gender   Cath/PCI: LHC/RHC (06/28/15): Mild diffuse disease involving the left coronary artery. 20-25% proximal and mid/distal RCA lesions. Mildly elevated left heart filling pressure. Normal right heart filling and pulmonary artery pressures. Normal LVEF with mildly reduced Fick cardiac output/index.   CV Surgery: None   EP Procedures and Devices: Event monitor (08/24/2017): Predominantly sinus rhythm with occasional PACs and PVCs.  48 episodes of PSVT up to 12.4 seconds. 24-hour Holter monitor (06/22/2017): This rhythm with frequent PVCs (8% burden) and rare PACs.  Single brief atrial run noted.   Non-Invasive Evaluation(s): Transthoracic echocardiogram (07/28/2018): Normal LV size and wall thickness.  LVEF 60-65% with grade 1 diastolic dysfunction.  Normal RV size and function.  No significant valvular abnormality. Pharmacologic MPI (04/12/2018): Low risk study without ischemia or scar.  LVEF calculated at 40%, likely artifactually low due to GI uptake. Transthoracic echocardiogram (04/29/2017): Normal LV size and LVEF (60 to 65%) with grade 1 diastolic dysfunction.  Trivial AI and mild MR.  Mild left atrial enlargement.  Normal RV contraction. Exercise myocardial perfusion stress test (06/07/15): Low risk scan without evidence of ischemia. Apical thinning noted. LVEF reduced at 40%, a likely artifactual. Transthoracic echocardiogram (04/10/15): Normal LV size and function with LVEF is 60-65%. Grade 1 diastolic dysfunction. Mild mitral regurgitation. Normal RV size and function. Mild pulmonary hypertension.  Current Meds  Medication Sig   apixaban (ELIQUIS) 5 MG TABS tablet Take 1 tablet (5 mg total) by mouth 2 (two) times daily.   clindamycin-benzoyl peroxide (BENZACLIN) gel APPLY TOPICALLY 2 (TWO) TIMES DAILY. APPLY TWICE DAILY TO AFFECTED AREAS AT SCALP AS NEEDED  finasteride (PROSCAR)  5 MG tablet TAKE 1 TABLET (5 MG TOTAL) BY MOUTH DAILY.   furosemide (LASIX) 40 MG tablet TAKE 1 TABLET BY MOUTH EVERY DAY   gabapentin (NEURONTIN) 100 MG capsule Take 100 mg by mouth 3 (three) times daily.   nitroGLYCERIN (NITROSTAT) 0.4 MG SL tablet Place 1 tablet (0.4 mg total) under the tongue every 5 (five) minutes as needed for chest pain. Maximum of 3 doses.    Allergies: Patient has no known allergies.  Social History   Tobacco Use   Smoking status: Former    Packs/day: 2.00    Years: 25.00    Total pack years: 50.00    Types: Cigarettes    Quit date: 02/25/1973    Years since quitting: 49.0   Smokeless tobacco: Never  Vaping Use   Vaping Use: Never used  Substance Use Topics   Alcohol use: No    Alcohol/week: 0.0 standard drinks of alcohol   Drug use: Never    Family History  Problem Relation Age of Onset   CVA Father    CAD Father    Alzheimer's disease Mother    Heart Problems Sister    Heart attack Brother    CAD Brother    Prostate cancer Neg Hx    Bladder Cancer Neg Hx    Kidney cancer Neg Hx     Review of Systems: A 12-system review of systems was performed and was negative except as noted in the HPI.  --------------------------------------------------------------------------------------------------  Physical Exam: BP 120/72 (BP Location: Left Arm, Patient Position: Sitting, Cuff Size: Normal)   Pulse 74   Ht '5\' 9"'$  (1.753 m)   Wt 209 lb (94.8 kg)   SpO2 95%   BMI 30.86 kg/m   General:  NAD. Neck: No JVD or HJR. Lungs: Clear to auscultation bilaterally without wheezes or crackles. Heart: Irregular rhythm without murmurs, rubs, or gallops. Abdomen: Soft, nontender, nondistended. Extremities: 1+ right and trace left calf edema.  EKG: Normal sinus rhythm with frequent PVCs, first-degree AV block, right bundle branch block, and left anterior fascicular block (trifascicular block).  Compared to prior tracing from 02/11/2022, PVCs are more frequent.   Otherwise, no significant interval change.  Lab Results  Component Value Date   WBC 7.4 11/05/2021   HGB 15.9 11/05/2021   HCT 47.9 11/05/2021   MCV 88.4 11/05/2021   PLT 225 11/05/2021    Lab Results  Component Value Date   NA 139 09/24/2021   K 4.0 09/24/2021   CL 106 09/24/2021   CO2 26 09/24/2021   BUN 15 09/24/2021   CREATININE 1.03 09/24/2021   GLUCOSE 113 (H) 09/24/2021   ALT 22 04/10/2015    Lab Results  Component Value Date   CHOL 109 07/28/2018   HDL 38 (L) 07/28/2018   LDLCALC 61 07/28/2018   TRIG 48 07/28/2018   CHOLHDL 2.9 07/28/2018    --------------------------------------------------------------------------------------------------  ASSESSMENT AND PLAN: Acute on chronic HFpEF: Jordan Figueroa feels like he is back to baseline though he still has some pretibial edema (right greater than left).  Recent injury to the right calf may be contributing to this.  We will continue furosemide 40 mg daily.  I have encouraged him to use knee-high compression stockings to help with his edema.  Calf pain: Etiology for sudden pain while walking a little over a week ago is unclear.  He may have had a muscle cramp leading to some residual soreness.  Vascular studies were negative for DVT at  arterial insufficiency.  Given preceding levofloxacin use, tendon rupture is a consideration, though Jordan Figueroa history and exam are not consistent with a complete Achilles tendon rupture.  I have encouraged him to increase his activity gradually, as tolerated, and to follow closely with Dr. Ouida Sills.  Paroxysmal atrial fibrillation: History remains unclear, though Jordan Figueroa has been maintained on long-term anticoagulation managed by Dr. Ouida Sills.  Follow-up: Return to clinic in 6 months.  Nelva Bush, MD 03/06/2022 10:23 AM

## 2022-03-06 NOTE — Patient Instructions (Signed)
Medication Instructions:  Your Physician recommend you continue on your current medication as directed.    *If you need a refill on your cardiac medications before your next appointment, please call your pharmacy*   Lab Work: None ordered today   Testing/Procedures: None ordered today   Follow-Up: At [facility], you and your health needs are our priority.  As part of our continuing mission to provide you with exceptional heart care, we have created designated Provider Care Teams.  These Care Teams include your primary Cardiologist (physician) and Advanced Practice Providers (APPs -  Physician Assistants and Nurse Practitioners) who all work together to provide you with the care you need, when you need it.  We recommend signing up for the patient portal called "MyChart".  Sign up information is provided on this After Visit Summary.  MyChart is used to connect with patients for Virtual Visits (Telemedicine).  Patients are able to view lab/test results, encounter notes, upcoming appointments, etc.  Non-urgent messages can be sent to your provider as well.   To learn more about what you can do with MyChart, go to [email or web address].    Your next appointment:   6 month(s)  Provider:   You may see Nelva Bush, MD or one of the following Advanced Practice Providers on your designated Care Team:   Murray Hodgkins, NP [name], PA-C Cadence Kathlen Mody, [place] Gerrie Nordmann, NP    Dr. Saunders Revel recommends purchasing knee high compression stockings. Please contact Dr. Ouida Sills if leg swelling or pain doesn't resolve.

## 2022-03-19 ENCOUNTER — Other Ambulatory Visit: Payer: Self-pay | Admitting: Internal Medicine

## 2022-04-01 ENCOUNTER — Ambulatory Visit (INDEPENDENT_AMBULATORY_CARE_PROVIDER_SITE_OTHER): Payer: Medicare Other | Admitting: Podiatry

## 2022-04-01 DIAGNOSIS — M7751 Other enthesopathy of right foot: Secondary | ICD-10-CM

## 2022-04-01 MED ORDER — BETAMETHASONE SOD PHOS & ACET 6 (3-3) MG/ML IJ SUSP
3.0000 mg | Freq: Once | INTRAMUSCULAR | Status: AC
  Administered 2022-04-01: 3 mg via INTRA_ARTICULAR

## 2022-04-01 NOTE — Progress Notes (Signed)
Chief Complaint  Patient presents with   Foot Pain    Patient came in today for a right foot ankle and foot pain follow-up, lateral side of the foot and ankle, rate of pain 5 out of 10, sharp and dull, patient would like an injection today     SUBJECTIVE Patient presents to office today for follow-up evaluation of right ankle pain.  Patient states the last injection helped significantly.  He was last seen in the office 12/23/2021.  Requesting another injection today for an arthritic ankle.  No new complaints at this time  Past Medical History:  Diagnosis Date   Arthritis    osteo-right hip   Basal cell carcinoma 05/29/2020   right forehead, left axilla, both tx'd with [facility] 06/25/2020   Cancer ([place])    skin   Colon polyps    Diastolic dysfunction    a. 03/2015 Echo: EF 60-65%, Gr1 DD, mild MR, nl RV fxn, mild PAH; b. 06/2015 RHC: minimal elevated PCWP; c.  04/2017 Echo: EF 60-65%, no rwma, GR1 DD, triv AI, mild MR, mildly dil LA, nl RV fxn, nl PASP; d. 10/2021 Echo: EF 60-65%, no rwma, GrI DD, nl RV fxn, RVSP 40.28mmHg. Mildly dil LA, triv AI, Ao sclerosis. Ao root 67mm.   Dysrhythmia    GERD (gastroesophageal reflux disease)    Hemorrhoids    Hiatal hernia    HOH (hard of hearing)    aides   Hypertension    IBS (irritable bowel syndrome)    Melanoma ([place]) 09/26/2012   vertex scalp. MM in situ.   Non-obstructive CAD (coronary artery disease)    a. 06/2015 Cath: LM 5, LAD 5ost, 30p, 78m, LCX nl, OM1 min irregs, OM2 nl, RCA 20p, 3m, RPDA/RPL1 min irregs, RPAV nl; b. 06/2017 Lexiscan MV: EF 55-65%, low risk.   PAF (paroxysmal atrial fibrillation) ([place])    a. Reported - no documentation in our records. Never seen on monitoring.   Paroxysmal supraventricular tachycardia 03/2015   First documented during episode of influenza   Pulmonary embolism ([place])    a. Chronic eliquis.   PVC's (premature ventricular contractions)    a. 06/2017 24h Holter: Avg HR 67 (49-110), rare PACs up to 5 beat  run, freq PVC's - 8% burden; b. 08/2017 Zio: Avg HR 64 (48-171), occas PACs & PVCs. 4 episodes of NSVT (4 beats longest). 48 episodes of SVT up to 12.4 secs, max rate 171; c. 09/2021 Zio: predom RSR @ 65 (50-106), occas PACs (3.1%), freq PVCs (16.5%), 8 NSVT (longest 13 beats, max HR 197). 12 SVT runs (longest 5 beats, max HR 193).   Squamous cell carcinoma of skin 04/20/2014   Right 3rd finger. WD SCC.     OBJECTIVE General Patient is awake, alert, and oriented x 3 and in no acute distress. Derm Skin is dry and supple bilateral. Negative open lesions or macerations. Remaining integument unremarkable. Nails are tender, long, thickened and dystrophic with subungual debris, consistent with onychomycosis, 1-5 bilateral. No signs of infection noted.  Hyperkeratotic preulcerative callus tissue also noted to the bilateral forefoot x4 Vasc  DP and PT pedal pulses palpable bilaterally. Temperature gradient within normal limits.  Neuro Epicritic and protective threshold sensation grossly intact bilaterally.  Musculoskeletal Exam No symptomatic pedal deformities noted bilateral. Muscular strength within normal limits.  Tenderness with palpation to the lateral aspect of the right ankle joint  ASSESSMENT 1.  Preulcerative symptomatic callus lesions bilateral feet 2.  Capsulitis right ankle  -Patient evaluated. -  Injection of 0.5 cc Celestone Soluspan injected lateral aspect of the right ankle -Continue wearing good supportive shoes and sneakers -Patient has a follow-up appointment in approximately 1 month for routine footcare.  Keep that appointment    Edrick Kins, DPM [facility]  Dr. Edrick Kins, DPM    [address], [place] [address]                Office [phone]  Fax [phone]

## 2022-04-28 ENCOUNTER — Ambulatory Visit (INDEPENDENT_AMBULATORY_CARE_PROVIDER_SITE_OTHER): Payer: Medicare Other | Admitting: Podiatry

## 2022-04-28 ENCOUNTER — Encounter: Payer: Self-pay | Admitting: Podiatry

## 2022-04-28 VITALS — BP 144/73 | HR 69

## 2022-04-28 DIAGNOSIS — B351 Tinea unguium: Secondary | ICD-10-CM

## 2022-04-28 DIAGNOSIS — M79675 Pain in left toe(s): Secondary | ICD-10-CM | POA: Diagnosis not present

## 2022-04-28 DIAGNOSIS — M79674 Pain in right toe(s): Secondary | ICD-10-CM

## 2022-04-28 NOTE — Progress Notes (Signed)
SUBJECTIVE Patient presents to office today complaining of elongated, thickened nails that cause pain while ambulating in shoes.  He is unable to trim his own nails.  Patient also states that he developed symptomatic calluses to the bilateral forefoot.  He is even had surgery in the past to help resolve this issue which was unsuccessful.  He would like to have the calluses trimmed today.  Patient is here for further evaluation and treatment.  Past Medical History:  Diagnosis Date   Arthritis    osteo-right hip   Basal cell carcinoma 05/29/2020   right forehead, left axilla, both tx'd with [facility] 06/25/2020   Cancer    skin   Colon polyps    Diastolic dysfunction    a. 03/2015 Echo: EF 60-65%, Gr1 DD, mild MR, nl RV fxn, mild PAH; b. 06/2015 RHC: minimal elevated PCWP; c.  04/2017 Echo: EF 60-65%, no rwma, GR1 DD, triv AI, mild MR, mildly dil LA, nl RV fxn, nl PASP; d. 10/2021 Echo: EF 60-65%, no rwma, GrI DD, nl RV fxn, RVSP 40.62mmHg. Mildly dil LA, triv AI, Ao sclerosis. Ao root 38mm.   Dysrhythmia    GERD (gastroesophageal reflux disease)    Hemorrhoids    Hiatal hernia    HOH (hard of hearing)    aides   Hypertension    IBS (irritable bowel syndrome)    Melanoma 09/26/2012   vertex scalp. MM in situ.   Non-obstructive CAD (coronary artery disease)    a. 06/2015 Cath: LM 5, LAD 5ost, 30p, 59m, LCX nl, OM1 min irregs, OM2 nl, RCA 20p, 60m, RPDA/RPL1 min irregs, RPAV nl; b. 06/2017 Lexiscan MV: EF 55-65%, low risk.   PAF (paroxysmal atrial fibrillation)    a. Reported - no documentation in our records. Never seen on monitoring.   Paroxysmal supraventricular tachycardia 03/2015   First documented during episode of influenza   Pulmonary embolism    a. Chronic eliquis.   PVC's (premature ventricular contractions)    a. 06/2017 24h Holter: Avg HR 67 (49-110), rare PACs up to 5 beat run, freq PVC's - 8% burden; b. 08/2017 Zio: Avg HR 64 (48-171), occas PACs & PVCs. 4 episodes of NSVT (4 beats  longest). 48 episodes of SVT up to 12.4 secs, max rate 171; c. 09/2021 Zio: predom RSR @ 65 (50-106), occas PACs (3.1%), freq PVCs (16.5%), 8 NSVT (longest 13 beats, max HR 197). 12 SVT runs (longest 5 beats, max HR 193).   Squamous cell carcinoma of skin 04/20/2014   Right 3rd finger. WD SCC.     OBJECTIVE General Patient is awake, alert, and oriented x 3 and in no acute distress. Derm Skin is dry and supple bilateral. Negative open lesions or macerations. Remaining integument unremarkable. Nails are tender, long, thickened and dystrophic with subungual debris, consistent with onychomycosis, 1-5 bilateral. No signs of infection noted.  Hyperkeratotic preulcerative callus tissue also noted to the bilateral forefoot x4 Vasc  DP and PT pedal pulses palpable bilaterally. Temperature gradient within normal limits.  Neuro Epicritic and protective threshold sensation grossly intact bilaterally.  Musculoskeletal Exam No symptomatic pedal deformities noted bilateral. Muscular strength within normal limits.  ASSESSMENT 1.  Pain due to onychomycosis of toenail both 2.  Preulcerative symptomatic callus lesions bilateral feet 3. Pain in foot bilateral  PLAN OF CARE 1. Patient evaluated today.  2. Instructed to maintain good pedal hygiene and foot care.  3. Mechanical debridement of nails 1-5 bilaterally performed using a nail nipper. Filed with  dremel without incident.  4.  Excisional debridement of the hyperkeratotic preulcerative callus tissue was performed using a chisel blade without incident or bleeding  5.  Return to clinic in 3 mos.    Felecia Shelling, DPM [facility]  Dr. Felecia Shelling, DPM    [address] [place], [place] [address]                Office [phone]  Fax [phone]

## 2022-05-13 ENCOUNTER — Ambulatory Visit: Payer: Medicare Other | Admitting: Internal Medicine

## 2022-06-29 ENCOUNTER — Other Ambulatory Visit: Payer: Self-pay | Admitting: Internal Medicine

## 2022-08-07 ENCOUNTER — Ambulatory Visit: Payer: Medicare Other | Admitting: Podiatry

## 2022-08-07 ENCOUNTER — Encounter: Payer: Self-pay | Admitting: Podiatry

## 2022-08-07 VITALS — BP 136/64

## 2022-08-07 DIAGNOSIS — L84 Corns and callosities: Secondary | ICD-10-CM | POA: Diagnosis not present

## 2022-08-07 DIAGNOSIS — M79674 Pain in right toe(s): Secondary | ICD-10-CM | POA: Diagnosis not present

## 2022-08-07 DIAGNOSIS — M79675 Pain in left toe(s): Secondary | ICD-10-CM

## 2022-08-07 DIAGNOSIS — D689 Coagulation defect, unspecified: Secondary | ICD-10-CM

## 2022-08-07 DIAGNOSIS — B351 Tinea unguium: Secondary | ICD-10-CM

## 2022-08-07 DIAGNOSIS — B353 Tinea pedis: Secondary | ICD-10-CM | POA: Diagnosis not present

## 2022-08-07 MED ORDER — KETOCONAZOLE 2 % EX CREA: TOPICAL_CREAM | CUTANEOUS | 1 refills | Status: DC

## 2022-08-07 NOTE — Patient Instructions (Signed)
 To prevent reinfection, spray shoes with lysol every evening.  Clean tub or shower with bleach based cleanser.  Athlete's Foot Athlete's foot (tinea pedis) is a fungal infection of the skin on your feet. It often occurs on the skin that is between or underneath the toes. It can also occur on the soles of your feet. The infection can spread from person to person (is contagious). It can also spread when a person's bare feet come in contact with the fungus on shower floors or on items such as shoes. What are the causes? This condition is caused by a fungus that grows in warm, moist places. You can get athlete's foot by sharing shoes, shower stalls, towels, and wet floors with someone who is infected. Not washing your feet or changing your socks often enough can also lead to athlete's foot. What increases the risk? This condition is more likely to develop in: Men. People who have a weak body defense system (immune system). People who have diabetes. People who use public showers, such as at a gym. People who wear heavy-duty shoes, such as industrial or military shoes. Seasons with warm, humid weather. What are the signs or symptoms? Symptoms of this condition include: Itchy areas between your toes or on the soles of your feet. White, flaky, or scaly areas between your toes or on the soles of your feet. Very itchy small blisters between your toes or on the soles of your feet. Small cuts in your skin. These cuts can become infected. Thick or discolored toenails. How is this diagnosed? This condition may be diagnosed with a physical exam and a review of your medical history. Your health care provider may also take a skin or toenail sample to examine under a microscope. How is this treated? This condition is treated with antifungal medicines. These may be applied as powders, ointments, or creams. In severe cases, an oral antifungal medicine may be given. Follow these instructions at  home: Medicines Apply or take over-the-counter and prescription medicines only as told by your health care provider. Apply your antifungal medicine as told by your health care provider. Do not stop using the antifungal even if your condition improves. Foot care Do not scratch your feet. Keep your feet dry: Wear cotton or wool socks. Change your socks every day or if they become wet. Wear shoes that allow air to flow, such as sandals or canvas tennis shoes. Wash and dry your feet, including the area between your toes. Also, wash and dry your feet: Every day or as told by your health care provider. After exercising. General instructions Do not let others use towels, shoes, nail clippers, or other personal items that touch your feet. Protect your feet by wearing sandals in wet areas, such as locker rooms and shared showers. Keep all follow-up visits. This is important. If you have diabetes, keep your blood sugar under control. Contact a health care provider if: You have a fever. You have swelling, soreness, warmth, or redness in your foot. Your feet are not getting better with treatment. Your symptoms get worse. You have new symptoms. You have severe pain. Summary Athlete's foot (tinea pedis) is a fungal infection of the skin on your feet. It often occurs on skin that is between or underneath the toes. This condition is caused by a fungus that grows in warm, moist places. Symptoms include white, flaky, or scaly areas between your toes or on the soles of your feet. This condition is treated with antifungal medicines.   Keep your feet clean. Always dry them thoroughly. This information is not intended to replace advice given to you by your health care provider. Make sure you discuss any questions you have with your health care provider. Document Revised: 04/21/2020 Document Reviewed: 04/21/2020 Elsevier Patient Education  2024 Elsevier Inc.  

## 2022-08-07 NOTE — Progress Notes (Signed)
  [address] [place], [place] [address]                Office [phone]  Fax [phone]  Subjective:  Patient ID: Jordan Figueroa, male    DOB: 22-Sep-1927,  MRN: [id number]  Faustin Olinski presents to clinic today for: at risk foot care with h/o coagulation defect and callus(es) b/l lower extremities and painful thick toenails that are difficult to trim. Painful toenails interfere with ambulation. Aggravating factors include wearing enclosed shoe gear. Pain is relieved with periodic professional debridement. Painful calluses are aggravated when weightbearing with and without shoegear. Pain is relieved with periodic professional debridement.  Chief Complaint  Patient presents with   Nail Problem    RFC,Referring Provider Lauro Regulus, MD,lov:01/24      Callouses    B/L feet with the feet calluses sore to walk on   PCP is Lauro Regulus, MD.  No Known Allergies  Review of Systems: Negative except as noted in the HPI.  Objective: No changes noted in today's physical examination. Vitals:   08/07/22 0843  BP: 136/64    Gant Gustason is a pleasant 89 y.o. male in NAD. AAO x 3.  Vascular Examination: Capillary refill time <3 seconds b/l LE. Palpable pedal pulses b/l LE. Digital hair absent b/l. No pedal edema b/l. Skin temperature gradient WNL b/l. No varicosities b/l. No cyanosis or clubbing noted b/l LE.Marland Kitchen  Dermatological Examination: Pedal skin with normal turgor, texture and tone b/l. No open wounds. No interdigital macerations b/l. Toenails 1-5 b/l thickened, discolored, dystrophic with subungual debris. There is pain on palpation to dorsal aspect of nailplates. Hyperkeratotic lesion(s) submet head 1 b/l and submet head 5 b/l.  No erythema, no edema, no drainage, no fluctuance. Diffuse scaling noted peripherally and plantarly b/l feet.  No interdigital macerations.  No blisters, no weeping. No signs of secondary bacterial  infection noted..  Neurological Examination: Protective sensation intact with 10 gram monofilament b/l LE. Vibratory sensation intact b/l LE.   Musculoskeletal Examination: Muscle strength 5/5 to all lower extremity muscle groups bilaterally. No pain, crepitus or joint limitation noted with ROM bilateral LE. No gross bony deformities bilaterally.  Assessment/Plan: 1. Pain due to onychomycosis of toenails of both feet   2. Callus   3. Tinea pedis of both feet   4. Coagulation defect [facility])   Patient was evaluated and treated. All patient's and/or POA's questions/concerns addressed on today's visit. Toenails 1-5 debrided in length and girth without incident. Continue soft, supportive shoe gear daily. Report any pedal injuries to medical professional. Call office if there are any questions/concerns. -Callus(es) submet head 1 b/l and submet head 5 b/l pared utilizing sterile scalpel blade without complication or incident. Total number debrided =4. -Discussed tinea pedis infection. To prevent re-infection of tinea pedis, patient/POA/caregiver instructed to spray shoes with Lysol every evening and clean tub/shower with bleach based cleanser. -For tinea pedis, Rx sent to pharmacy for Ketoconazole Cream 2% to be applied once daily for six weeks. -Patient/POA to call should there be question/concern in the interim.   Return in about 9 weeks (around 10/09/2022).  Freddie Breech, DPM

## 2022-10-16 ENCOUNTER — Ambulatory Visit (INDEPENDENT_AMBULATORY_CARE_PROVIDER_SITE_OTHER): Payer: Medicare Other | Admitting: Podiatry

## 2022-10-16 ENCOUNTER — Encounter: Payer: Self-pay | Admitting: Podiatry

## 2022-10-16 DIAGNOSIS — D689 Coagulation defect, unspecified: Secondary | ICD-10-CM | POA: Diagnosis not present

## 2022-10-16 DIAGNOSIS — B351 Tinea unguium: Secondary | ICD-10-CM | POA: Diagnosis not present

## 2022-10-16 DIAGNOSIS — M79675 Pain in left toe(s): Secondary | ICD-10-CM

## 2022-10-16 DIAGNOSIS — L84 Corns and callosities: Secondary | ICD-10-CM

## 2022-10-16 DIAGNOSIS — M79674 Pain in right toe(s): Secondary | ICD-10-CM

## 2022-10-16 NOTE — Progress Notes (Signed)
Subjective:  Patient ID: Jordan Figueroa, male    DOB: 25-Jul-1927,  MRN: [id number]  89 y.o. male presents at risk foot care with h/o coagulation defect and callus(es) of both feet and painful thick toenails that are difficult to trim. Painful toenails interfere with ambulation. Aggravating factors include wearing enclosed shoe gear. Pain is relieved with periodic professional debridement. Painful calluses are aggravated when weightbearing with and without shoegear. Pain is relieved with periodic professional debridement.  Chief Complaint  Patient presents with   Callouses   Nail Problem   New problem(s): None   PCP is Lauro Regulus, MD , and last visit was August 24, 2022.  No Known Allergies  Review of Systems: Negative except as noted in the HPI.   Objective:  Jordan Figueroa is a pleasant 89 y.o. male WD, WN in NAD.Marland Kitchen AAO x 3.  Vascular Examination: Vascular status intact b/l with palpable pedal pulses. CFT immediate b/l. Pedal hair present. No edema. No pain with calf compression b/l. Skin temperature gradient WNL b/l. No varicosities noted. No cyanosis or clubbing noted.  Neurological Examination: Sensation grossly intact b/l with 10 gram monofilament. Vibratory sensation intact b/l.  Dermatological Examination: Pedal skin with normal turgor, texture and tone b/l. No open wounds nor interdigital macerations noted. Toenails 1-5 b/l thick, discolored, elongated with subungual debris and pain on dorsal palpation.   Hyperkeratotic lesion(s) submet head 1 b/l and submet head 5 b/l.  No erythema, no edema, no drainage, no fluctuance. Tinea is nearly resolved.  Musculoskeletal Examination: Muscle strength 5/5 to b/l LE.  No pain, crepitus noted b/l. Hammertoes b/l. Plantarflexed metatarsals 1st/5th bilaterally. Patient ambulates independently without assistive aids. He is wearing diabetic shoes and has insoles that are not offloaded for his calluses.  Radiographs: None  Last  A1c:       No data to display           Assessment:   1. Pain due to onychomycosis of toenails of both feet   2. Callus   3. Coagulation defect (HCC)    Plan:  -Consent given for treatment as described below: -Examined patient. -Tinea b/l is nearly resolved. He may continue current tube and use once daily to both feet and between toes. No refill needed at this time. -Patient saw Pedorthist today and had insoles offloaded for calluses submet heads 1, 5 b/l. -Toenails 1-5 b/l were debrided in length and girth with sterile nail nippers and dremel without iatrogenic bleeding.  -Callus(es) submet head 1 b/l and submet head 5 b/l pared utilizing sterile scalpel blade without complication or incident. Total number debrided =4. -Patient/POA to call should there be question/concern in the interim.  Return in about 10 weeks (around 12/25/2022).  Freddie Breech, DPM

## 2022-10-28 ENCOUNTER — Ambulatory Visit: Payer: Medicare Other | Admitting: Internal Medicine

## 2022-11-08 NOTE — Progress Notes (Unsigned)
Cardiology Clinic Note   Date: 11/09/2022 ID: Jordan Figueroa, DOB 1927/10/26, MRN [id number]  Primary Cardiologist:  Yvonne Kendall, MD  Patient Profile    Jordan Figueroa is a 89 y.o. male who presents to the clinic today for routine follow up.     Past medical history significant for: Nonobstructive CAD. LHC/RHC 06/28/2015: Proximal and mid-distal RCA with focal 20-25% lesions, mild diffuse calcification left main and proximal LAD, proximal LAD 30% stenosis, mildly elevated LVEDP with high normal wedge pressure.  Nuclear stress test 04/12/2018: Normal wall motion, EF 40% (GI uptake artifact noted, possibly contributing to depressed EF), no EKG changes concerning for ischemia at peak stress or in recovery, rare PVC, low risk scan. Chronic HFpEF. Echo 10/21/2021: EF 60-65%, Grade I DD, mildly elevated pulmonary artery systolic pressure, left atrial size mildly dilated, trivial aortic valve regurgitation, aortic valve sclerosis/calcification, borderline dilatation of aortic root.  PVCs/PSVT 72-hour cardiac monitor 10/03/2021: Predominantly sinus rhythm with occasional PAC's and frequent PVC's (16.5% burden). Several episodes of NSVT and PSVT also occurred.  ?PAF. Chronic PE.  In summary, patient was first evaluated by cardiology for new onset SVT on 04/10/2015 during hospital admission for tachycardia and dizziness.  He received adenosine 12 mg x 1 and IV Cardizem 10 mg in the ED with brief conversion to NSR but quick return to SVT with heart rate in the 130s.  He subsequently converted to NSR with PACs with heart rate in the 90s on metoprolol and Cardizem drip.  Found to be flu a positive.  He has a history of PE and was started on Xarelto and subsequently changed to Eliquis.  Upon follow-up he complained of exertional dyspnea and shortness of breath.  Stress testing was negative for ischemia.  Patient continued to have significant exertional dyspnea and eventually underwent R/LHC which showed  nonobstructive CAD and mildly elevated left heart filling pressure/normal right heart filling and pulmonary arterial pressure.  There is a questionable history of A-fib but no documentation.  In 2019 patient wore 48-hour Holter which showed frequent PVCs with 80% burden.  Nuclear stress testing at that time was normal.  Further evaluation with ZIO showed 48 episodes of SVT lasting up to 12.4 seconds.  No PAF.  Asymptomatic with episodes of SVT and triggered events corresponded to sinus rhythm and PVCs.     History of Present Illness    Jordan Figueroa is followed by Dr. Okey Dupre for the above outlined history.  Patient was seen in the office by Dr. Okey Dupre on 02/11/2022 with complaints of intermittent chest pain occurring approximately once a week.  Pain happens randomly and not associated with exertion or other activities lasts about 5 minutes and resolves on its own.  He continues to have chronic exertional dyspnea but noted his weight had been up and some lower extremity edema secondary to dietary indiscretion.  Repeat ischemic evaluation was deferred favoring trying to get patient's volume back to baseline.  Lasix was increased to 80 mg for 3 to 4 days and patient scheduled for close follow-up.  Upon follow-up on 03/06/2022 patient reported he had been seen at [facility] clinic 10 days after his visit in January with complaints of worsening cough and was treated empirically for pneumonia with levofloxacin and Tessalon Perles.  He reported breathing back to baseline and lower extremity edema resolved.  He did see his PCP for sudden onset of right calf pain and swelling.  He underwent lower extremity arterial and venous vascular studies which showed normal arterial perfusion  and absence of DVT.  Discussed the use of AI scribe software for clinical note transcription with the patient, who gave verbal consent to proceed.  The patient is here alone for routine follow-up. He reports continued intermittent chest pain that may  be the same as previous complaints but he is not sure. It occurs a couple of times a week and he feels it is lasting longer than previously.  The pain is described as not severe, more of an awareness, and it typically lasts about 10 minutes before resolving. The patient reports the pain usually occurs in the afternoon while sitting. He also reports chronic shortness of breath, particularly with exertion but very rarely at rest. This is unchanged from previous. He has lower extremity edema that is stable. It is typically at its best in the morning and progresses throughout the day. The patient has been using short compression socks. He tried the longer ones but they caused more discomfort. He also reports occasional nocturnal leg pain that wakes him up and requires him to move or stand. Patient denies palpitations.         ROS: All other systems reviewed and are otherwise negative except as noted in History of Present Illness.  Studies Reviewed    EKG Interpretation Date/Time:  Monday November 09 2022 13:30:07 EDT Ventricular Rate:  80 PR Interval:  272 QRS Duration:  146 QT Interval:  426 QTC Calculation: 491 R Axis:   -47  Text Interpretation: Sinus rhythm with 1st degree A-V block with occasional Premature ventricular complexes Right bundle branch block Left anterior fascicular block Bifascicular block When compared with ECG of 03/06/2022 no significant changes Confirmed by Carlos Levering [phone]) on 11/09/2022 1:39:27 PM   Risk Assessment/Calculations     CHA2DS2-VASc Score = 3   This indicates a 3.2% annual risk of stroke. The patient's score is based upon: CHF History: 1 HTN History: 0 Diabetes History: 0 Stroke History: 0 Vascular Disease History: 0 Age Score: 2 Gender Score: 0             Physical Exam    VS:  BP 125/68 (BP Location: Left Arm, Patient Position: Sitting, Cuff Size: Normal)   Pulse 80   Ht 5\' 11"  (1.803 m)   Wt 210 lb 9.6 oz (95.5 kg)   SpO2 98%    BMI 29.37 kg/m  , BMI Body mass index is 29.37 kg/m.  GEN: Well nourished, well developed, in no acute distress. Neck: No JVD or carotid bruits. Cardiac: RRR. Occasional extrasystole. No murmurs. No rubs or gallops.   Respiratory:  Respirations regular and unlabored. Clear to auscultation without rales, wheezing or rhonchi. GI: Soft, nontender, nondistended. Extremities: Radials/DP/PT 2+ and equal bilaterally. No clubbing or cyanosis. Mild, nonpitting edema bilaterally.   Skin: Warm and dry, no rash. Neuro: Strength intact.  Assessment & Plan   Assessment and Plan    Nonobstructive CAD/Chest Pain LHC June 2017 showed nonobstructive CAD. Chest pain is intermittent, lasting about 10 minutes, occurring a couple of times a week, described as an awareness rather than severe pain. No associated shortness of breath. He is not sure if the discomfort is the same as his previous complaints but he does feel it is lasting longer. -Order nuclear stress test to evaluate for possible cardiac etiology. -Continue as needed SL NTG.  Not on aspirin secondary to Eliquis.  Chronic HFpEF/Chronic Shortness of Breath/Chronic Lower Extremity Edema Echo October 2023 showed normal LV function, Grade I DD, mildly elevated PA pressure.  Patient with a history of chronic exertional dyspnea. Stable, occurs with activity and sometimes when reading. No change since last visit. Chronic lower extremity edema, worsens throughout the day. Knee high compression socks tried but discontinued due to discomfort. Mild, nonpitting edema bilateral lower extremities otherwise euvolemic and well compensated on exam.  -Continue Lasix. -Advise elevating legs throughout the day.  Palpitations/PVCs/PSVT/?PAF 3-day ZIO September 2023 showed frequent PVCs 16.5% burden and several episodes of NSVT and PSVT.  Patient denies palpitations today.  Not on beta-blocker or nondihydropyridine calcium channel blocker secondary to trifascicular block.  No spontaneous bleeding concerns reported.  -Continue to monitor.  Consider EP evaluation if patient develops progressive symptoms.      Disposition: Lexiscan. Return in 3 months or sooner as needed.         Informed Consent   Shared Decision Making/Informed Consent The risks [chest pain, shortness of breath, cardiac arrhythmias, dizziness, blood pressure fluctuations, myocardial infarction, stroke/transient ischemic attack, nausea, vomiting, allergic reaction, radiation exposure, metallic taste sensation and life-threatening complications (estimated to be 1 in 10,000)], benefits (risk stratification, diagnosing coronary artery disease, treatment guidance) and alternatives of a nuclear stress test were discussed in detail with Mr. Lacina and he agrees to proceed.      Signed, Etta Grandchild. Agape Hardiman, DNP, NP-C

## 2022-11-09 ENCOUNTER — Ambulatory Visit: Payer: Medicare Other | Attending: Internal Medicine | Admitting: Student

## 2022-11-09 ENCOUNTER — Encounter: Payer: Self-pay | Admitting: Student

## 2022-11-09 VITALS — BP 125/68 | HR 80 | Ht 71.0 in | Wt 210.6 lb

## 2022-11-09 DIAGNOSIS — I493 Ventricular premature depolarization: Secondary | ICD-10-CM | POA: Diagnosis present

## 2022-11-09 DIAGNOSIS — I2510 Atherosclerotic heart disease of native coronary artery without angina pectoris: Secondary | ICD-10-CM | POA: Insufficient documentation

## 2022-11-09 DIAGNOSIS — I471 Supraventricular tachycardia, unspecified: Secondary | ICD-10-CM | POA: Insufficient documentation

## 2022-11-09 DIAGNOSIS — I480 Paroxysmal atrial fibrillation: Secondary | ICD-10-CM | POA: Diagnosis present

## 2022-11-09 DIAGNOSIS — R0609 Other forms of dyspnea: Secondary | ICD-10-CM | POA: Diagnosis present

## 2022-11-09 DIAGNOSIS — R002 Palpitations: Secondary | ICD-10-CM | POA: Insufficient documentation

## 2022-11-09 DIAGNOSIS — R072 Precordial pain: Secondary | ICD-10-CM | POA: Insufficient documentation

## 2022-11-09 DIAGNOSIS — R600 Localized edema: Secondary | ICD-10-CM | POA: Insufficient documentation

## 2022-11-09 DIAGNOSIS — I5032 Chronic diastolic (congestive) heart failure: Secondary | ICD-10-CM | POA: Insufficient documentation

## 2022-11-09 NOTE — Patient Instructions (Signed)
Medication Instructions:  No changes at this time.   *If you need a refill on your cardiac medications before your next appointment, please call your pharmacy*   Lab Work: None  If you have labs (blood work) drawn today and your tests are completely normal, you will receive your results only by: MyChart Message (if you have MyChart) OR A paper copy in the mail If you have any lab test that is abnormal or we need to change your treatment, we will call you to review the results.   Testing/Procedures: [facility] MYOVIEW  Your Provider has ordered a Stress Test with nuclear imaging. The purpose of this test is to evaluate the blood supply to your heart muscle. This procedure is referred to as a "Non-Invasive Stress Test." This is because other than having an IV started in your vein, nothing is inserted or "invades" your body. Cardiac stress tests are done to find areas of poor blood flow to the heart by determining the extent of coronary artery disease (CAD). Some patients exercise on a treadmill, which naturally increases the blood flow to your heart, while others who are unable to walk on a treadmill due to physical limitations have a pharmacologic/chemical stress agent called Lexiscan. This medicine will mimic walking on a treadmill by temporarily increasing your coronary blood flow.     REPORT TO [facility] MEDICAL MALL ENTRANCE  **Proceed to the 1st desk on the right, REGISTRATION, to check in**  Please note: this test may take anywhere between 2-4 hours to complete    Instructions regarding medication:   _XX__:   You may take all of your regular morning medications the day of your test unless listed below.   _XX___:  Hold other medications as follows: Furosamide    How to prepare for your Myoview test:  Do not eat or drink for 6 hours prior to the test No caffeine for 24 hours prior to the test No smoking 24 hours prior to the test. Ladies, please do not wear dresses.  Skirts or pants  are appropriate. Please wear a short sleeve shirt. No perfume, cologne or lotion. Wear comfortable walking shoes. No heels!   PLEASE NOTIFY THE OFFICE AT LEAST 24 HOURS IN ADVANCE IF YOU ARE UNABLE TO KEEP YOUR APPOINTMENT.  [phone] AND  PLEASE NOTIFY NUCLEAR MEDICINE AT [facility] AT LEAST 24 HOURS IN ADVANCE IF YOU ARE UNABLE TO KEEP YOUR APPOINTMENT. [phone]    Follow-Up: At [facility], you and your health needs are our priority.  As part of our continuing mission to provide you with exceptional heart care, we have created designated Provider Care Teams.  These Care Teams include your primary Cardiologist (physician) and Advanced Practice Providers (APPs -  Physician Assistants and Nurse Practitioners) who all work together to provide you with the care you need, when you need it.   Your next appointment:   3 month(s)  Provider:   Yvonne Kendall, MD

## 2022-11-20 ENCOUNTER — Ambulatory Visit
Admission: RE | Admit: 2022-11-20 | Discharge: 2022-11-20 | Disposition: A | Discharge status: Home / Self Care | Payer: Medicare Other | Source: Ambulatory Visit | Attending: Student | Admitting: Student

## 2022-11-20 DIAGNOSIS — R072 Precordial pain: Secondary | ICD-10-CM | POA: Diagnosis present

## 2022-11-20 LAB — NM MYOCAR MULTI W/SPECT W/WALL MOTION / EF
LV dias vol: 101 mL (ref 62–150)
LV sys vol: 38 mL
MPHR: 128 {beats}/min
Nuc Stress EF: 62 %
Peak HR: 86 {beats}/min
Percent HR: 67 %
Rest HR: 58 {beats}/min
Rest Nuclear Isotope Dose: 10.3 mCi
SDS: 0
SRS: 8
SSS: 1
Stress Nuclear Isotope Dose: 27.9 mCi
TID: 1.03

## 2022-11-20 MED ORDER — REGADENOSON 0.4 MG/5ML IV SOLN
0.4000 mg | Freq: Once | INTRAVENOUS | Status: AC
  Administered 2022-11-20: 0.4 mg via INTRAVENOUS
  Filled 2022-11-20: qty 5

## 2022-11-20 MED ORDER — TECHNETIUM TC 99M TETROFOSMIN IV KIT
30.0000 | PACK | Freq: Once | INTRAVENOUS | Status: AC
  Administered 2022-11-20: 27.85 via INTRAVENOUS

## 2022-11-20 MED ORDER — TECHNETIUM TC 99M TETROFOSMIN IV KIT
10.0000 | PACK | Freq: Once | INTRAVENOUS | Status: AC
  Administered 2022-11-20: 10.31 via INTRAVENOUS

## 2022-12-09 ENCOUNTER — Other Ambulatory Visit: Payer: Self-pay | Admitting: Internal Medicine

## 2022-12-17 ENCOUNTER — Telehealth: Payer: Self-pay | Admitting: Internal Medicine

## 2022-12-17 ENCOUNTER — Telehealth: Payer: Self-pay | Admitting: Urology

## 2022-12-17 ENCOUNTER — Other Ambulatory Visit: Payer: Self-pay | Admitting: *Deleted

## 2022-12-17 DIAGNOSIS — N138 Other obstructive and reflux uropathy: Secondary | ICD-10-CM

## 2022-12-17 MED ORDER — FUROSEMIDE 40 MG PO TABS: 40.0000 mg | ORAL_TABLET | Freq: Every day | ORAL | 3 refills | Status: DC

## 2022-12-17 MED ORDER — FINASTERIDE 5 MG PO TABS: 5.0000 mg | ORAL_TABLET | Freq: Every day | ORAL | 3 refills | Status: DC

## 2022-12-17 NOTE — Telephone Encounter (Signed)
Medication sent.

## 2022-12-17 NOTE — Telephone Encounter (Signed)
*  STAT* If patient is at the pharmacy, call can be transferred to refill team.   1. Which medications need to be refilled? (please list name of each medication and dose if known)   furosemide (LASIX) 40 MG tablet    2. Which pharmacy/location (including street and city if local pharmacy) is medication to be sent to?  [facility] [id number] - [place], [place] - [address]      3. Do they need a 30 day or 90 day supply? 90 day

## 2022-12-17 NOTE — Telephone Encounter (Signed)
[facility] in [place]  called to request refill for Finasteride 5 mg tablet

## 2022-12-25 ENCOUNTER — Encounter: Payer: Self-pay | Admitting: Podiatry

## 2022-12-25 ENCOUNTER — Ambulatory Visit (INDEPENDENT_AMBULATORY_CARE_PROVIDER_SITE_OTHER): Payer: Medicare Other | Admitting: Podiatry

## 2022-12-25 VITALS — Ht 71.0 in | Wt 210.6 lb

## 2022-12-25 DIAGNOSIS — B351 Tinea unguium: Secondary | ICD-10-CM | POA: Diagnosis not present

## 2022-12-25 DIAGNOSIS — M79674 Pain in right toe(s): Secondary | ICD-10-CM | POA: Diagnosis not present

## 2022-12-25 DIAGNOSIS — D689 Coagulation defect, unspecified: Secondary | ICD-10-CM | POA: Diagnosis not present

## 2022-12-25 DIAGNOSIS — M79675 Pain in left toe(s): Secondary | ICD-10-CM | POA: Diagnosis not present

## 2022-12-25 DIAGNOSIS — L84 Corns and callosities: Secondary | ICD-10-CM

## 2022-12-25 NOTE — Progress Notes (Signed)
  Subjective:  Patient ID: Jordan Figueroa, male    DOB: 1927-07-06,  MRN: [id number]  89 y.o. male presents at risk foot care with h/o coagulation defect and callus(es) b/l feet and painful thick toenails that are difficult to trim. Painful toenails interfere with ambulation. Aggravating factors include wearing enclosed shoe gear. Pain is relieved with periodic professional debridement. Painful calluses are aggravated when weightbearing with and without shoegear. Pain is relieved with periodic professional debridement.  Chief Complaint  Patient presents with   Nail Problem    Pt is here for RFC not a diabetis PCP is Dr Dareen Piano and LOV was September.   New problem(s): None   PCP is Lauro Regulus, MD.  No Known Allergies  Review of Systems: Negative except as noted in the HPI.   Objective:  Aristidis Adler is a pleasant 89 y.o. male in NAD. AAO x 3.  Vascular Examination: Palpable pedal pulses. CFT immediate b/l. Pedal hair present. No edema. No pain with calf compression b/l. Skin temperature gradient WNL b/l. No varicosities noted. No cyanosis or clubbing noted.  Neurological Examination: Sensation grossly intact b/l with 10 gram monofilament. Vibratory sensation intact b/l.  Dermatological Examination: Pedal skin with normal turgor, texture and tone b/l. No open wounds nor interdigital macerations noted. Toenails 1-5 b/l thick, discolored, elongated with subungual debris and pain on dorsal palpation.   Hyperkeratotic lesion(s) submet head 1 left foot, submet head 1 right foot, submet head 5 left foot, and submet head 5 right foot.  No erythema, no edema, no drainage, no fluctuance.  Musculoskeletal Examination: Muscle strength 5/5 to b/l LE.  No pain, crepitus noted b/l. Plantarflexed metatarsal(s) 1st metatarsal head of both feet and 5th metatarsal head of both feet. Hammertoe deformity noted 2-5 b/l.Patient ambulates independently without assistive aids.   Radiographs:  None  Last A1c:       No data to display           Assessment:   1. Pain due to onychomycosis of toenails of both feet   2. Callus   3. Coagulation defect [facility])    Plan:  -Patient was evaluated today. All questions/concerns addressed on today's visit. -Continue supportive shoe gear daily. -Mycotic toenails 1-5 bilaterally were debrided in length and girth with sterile nail nippers and dremel without incident. -Callus(es) submet head 1 left foot, submet head 1 right foot, submet head 5 left foot, and submet head 5 right foot pared utilizing sterile scalpel blade without complication or incident. Total number debrided =4. -Patient/POA to call should there be question/concern in the interim.  Return in about 9 weeks (around 02/26/2023).  Freddie Breech, DPM      [facility] LOCATION: [address], [place] [address]                   Office [phone]   [facility] LOCATION: [address] [place], [place] [address] Office [phone]

## 2023-01-15 ENCOUNTER — Ambulatory Visit: Payer: Medicare Other | Admitting: Urology

## 2023-01-28 ENCOUNTER — Ambulatory Visit: Payer: Medicare Other | Admitting: Dermatology

## 2023-01-28 ENCOUNTER — Encounter: Payer: Self-pay | Admitting: Dermatology

## 2023-01-28 DIAGNOSIS — Z1283 Encounter for screening for malignant neoplasm of skin: Secondary | ICD-10-CM | POA: Diagnosis not present

## 2023-01-28 DIAGNOSIS — W908XXA Exposure to other nonionizing radiation, initial encounter: Secondary | ICD-10-CM

## 2023-01-28 DIAGNOSIS — L814 Other melanin hyperpigmentation: Secondary | ICD-10-CM | POA: Diagnosis not present

## 2023-01-28 DIAGNOSIS — L820 Inflamed seborrheic keratosis: Secondary | ICD-10-CM

## 2023-01-28 DIAGNOSIS — L570 Actinic keratosis: Secondary | ICD-10-CM

## 2023-01-28 DIAGNOSIS — D229 Melanocytic nevi, unspecified: Secondary | ICD-10-CM

## 2023-01-28 DIAGNOSIS — L578 Other skin changes due to chronic exposure to nonionizing radiation: Secondary | ICD-10-CM | POA: Diagnosis not present

## 2023-01-28 DIAGNOSIS — L821 Other seborrheic keratosis: Secondary | ICD-10-CM

## 2023-01-28 DIAGNOSIS — Z86006 Personal history of melanoma in-situ: Secondary | ICD-10-CM

## 2023-01-28 DIAGNOSIS — Z85828 Personal history of other malignant neoplasm of skin: Secondary | ICD-10-CM

## 2023-01-28 DIAGNOSIS — D1801 Hemangioma of skin and subcutaneous tissue: Secondary | ICD-10-CM

## 2023-01-28 DIAGNOSIS — Z872 Personal history of diseases of the skin and subcutaneous tissue: Secondary | ICD-10-CM

## 2023-01-28 NOTE — Patient Instructions (Addendum)
Actinic keratoses are precancerous spots that appear secondary to cumulative UV radiation exposure/sun exposure over time. They are chronic with expected duration over 1 year. A portion of actinic keratoses will progress to squamous cell carcinoma of the skin. It is not possible to reliably predict which spots will progress to skin cancer and so treatment is recommended to prevent development of skin cancer.  Recommend daily broad spectrum sunscreen SPF 30+ to sun-exposed areas, reapply every 2 hours as needed.  Recommend staying in the shade or wearing long sleeves, sun glasses (UVA+UVB protection) and wide brim hats (4-inch brim around the entire circumference of the hat). Call for new or changing lesions.      Seborrheic Keratosis  What causes seborrheic keratoses? Seborrheic keratoses are harmless, common skin growths that first appear during adult life.  As time goes by, more growths appear.  Some people may develop a large number of them.  Seborrheic keratoses appear on both covered and uncovered body parts.  They are not caused by sunlight.  The tendency to develop seborrheic keratoses can be inherited.  They vary in color from skin-colored to gray, brown, or even black.  They can be either smooth or have a rough, warty surface.   Seborrheic keratoses are superficial and look as if they were stuck on the skin.  Under the microscope this type of keratosis looks like layers upon layers of skin.  That is why at times the top layer may seem to fall off, but the rest of the growth remains and re-grows.    Treatment Seborrheic keratoses do not need to be treated, but can easily be removed in the office.  Seborrheic keratoses often cause symptoms when they rub on clothing or jewelry.  Lesions can be in the way of shaving.  If they become inflamed, they can cause itching, soreness, or burning.  Removal of a seborrheic keratosis can be accomplished by freezing, burning, or surgery. If any spot bleeds,  scabs, or grows rapidly, please return to have it checked, as these can be an indication of a skin cancer.   Cryotherapy Aftercare  Wash gently with soap and water everyday.   Apply Vaseline and Band-Aid daily until healed.       Melanoma ABCDEs  Melanoma is the most dangerous type of skin cancer, and is the leading cause of death from skin disease.  You are more likely to develop melanoma if you: Have light-colored skin, light-colored eyes, or red or blond hair Spend a lot of time in the sun Tan regularly, either outdoors or in a tanning bed Have had blistering sunburns, especially during childhood Have a close family member who has had a melanoma Have atypical moles or large birthmarks  Early detection of melanoma is key since treatment is typically straightforward and cure rates are extremely high if we catch it early.   The first sign of melanoma is often a change in a mole or a new dark spot.  The ABCDE system is a way of remembering the signs of melanoma.  A for asymmetry:  The two halves do not match. B for border:  The edges of the growth are irregular. C for color:  A mixture of colors are present instead of an even brown color. D for diameter:  Melanomas are usually (but not always) greater than 6mm - the size of a pencil eraser. E for evolution:  The spot keeps changing in size, shape, and color.  Please check your skin once per month  between visits. You can use a small mirror in front and a large mirror behind you to keep an eye on the back side or your body.   If you see any new or changing lesions before your next follow-up, please call to schedule a visit.  Please continue daily skin protection including broad spectrum sunscreen SPF 30+ to sun-exposed areas, reapplying every 2 hours as needed when you're outdoors.   Staying in the shade or wearing long sleeves, sun glasses (UVA+UVB protection) and wide brim hats (4-inch brim around the entire circumference of the  hat) are also recommended for sun protection.    Due to recent changes in healthcare laws, you may see results of your pathology and/or laboratory studies on MyChart before the doctors have had a chance to review them. We understand that in some cases there may be results that are confusing or concerning to you. Please understand that not all results are received at the same time and often the doctors may need to interpret multiple results in order to provide you with the best plan of care or course of treatment. Therefore, we ask that you please give [place] 2 business days to thoroughly review all your results before contacting the office for clarification. Should we see a critical lab result, you will be contacted sooner.   If You Need Anything After Your Visit  If you have any questions or concerns for your doctor, please call our main line at [phone] and press option 4 to reach your doctor's medical assistant. If no one answers, please leave a voicemail as directed and we will return your call as soon as possible. Messages left after 4 pm will be answered the following business day.   You may also send [place] a message via MyChart. We typically respond to MyChart messages within 1-2 business days.  For prescription refills, please ask your pharmacy to contact our office. Our fax number is [phone].  If you have an urgent issue when the clinic is closed that cannot wait until the next business day, you can page your doctor at the number below.    Please note that while we do our best to be available for urgent issues outside of office hours, we are not available 24/7.   If you have an urgent issue and are unable to reach [place], you may choose to seek medical care at your doctor's office, retail clinic, urgent care center, or emergency room.  If you have a medical emergency, please immediately call 911 or go to the emergency department.  Pager Numbers  - Dr. Gwen Pounds: [phone]  - Dr.  Roseanne Reno: [phone]  - Dr. Katrinka Blazing: [phone]   In the event of inclement weather, please call our main line at [phone] for an update on the status of any delays or closures.  Dermatology Medication Tips: Please keep the boxes that topical medications come in in order to help keep track of the instructions about where and how to use these. Pharmacies typically print the medication instructions only on the boxes and not directly on the medication tubes.   If your medication is too expensive, please contact our office at [phone] option 4 or send [place] a message through MyChart.   We are unable to tell what your co-pay for medications will be in advance as this is different depending on your insurance coverage. However, we may be able to find a substitute medication at lower cost or fill out paperwork to get insurance to cover a  needed medication.   If a prior authorization is required to get your medication covered by your insurance company, please allow [place] 1-2 business days to complete this process.  Drug prices often vary depending on where the prescription is filled and some pharmacies may offer cheaper prices.  The website www.goodrx.com contains coupons for medications through different pharmacies. The prices here do not account for what the cost may be with help from insurance (it may be cheaper with your insurance), but the website can give you the price if you did not use any insurance.  - You can print the associated coupon and take it with your prescription to the pharmacy.  - You may also stop by our office during regular business hours and pick up a GoodRx coupon card.  - If you need your prescription sent electronically to a different pharmacy, notify our office through [facility] or by phone at [phone] option 4.     Si Usted Necesita Algo Despus de Su Visita  Tambin puede enviarnos un mensaje a travs de Clinical cytogeneticist. Por lo general respondemos a los  mensajes de MyChart en el transcurso de 1 a 2 das hbiles.  Para renovar recetas, por favor pida a su farmacia que se ponga en contacto con nuestra oficina. Annie Sable de fax es [place] [phone].  Si tiene un asunto urgente cuando la clnica est cerrada y que no puede esperar hasta el siguiente da hbil, puede llamar/localizar a su doctor(a) al nmero que aparece a continuacin.   Por favor, tenga en cuenta que aunque hacemos todo lo posible para estar disponibles para asuntos urgentes fuera del horario de [place], no estamos disponibles las 24 horas del da, los 7 [address]  [address] de la [place].   Si tiene un problema urgente y no puede comunicarse con nosotros, puede optar por buscar atencin mdica  en el consultorio de su doctor(a), en una clnica privada, en un centro de atencin urgente o en una sala de emergencias.  Si tiene Engineer, drilling, por favor llame inmediatamente al 911 o vaya a la sala de emergencias.  Nmeros de bper  - Dr. Gwen Pounds: [phone]  - Dra. Roseanne Reno: [phone]  - Dr. Katrinka Blazing: [phone]   En caso de inclemencias del tiempo, por favor llame a Lacy Duverney principal al [phone] para una actualizacin sobre el [place] de cualquier retraso o cierre.  Consejos para la medicacin en dermatologa: Por favor, guarde las cajas en las que vienen los medicamentos de uso tpico para ayudarle a seguir las instrucciones sobre dnde y cmo usarlos. Las farmacias generalmente imprimen las instrucciones del medicamento slo en las cajas y no directamente en los tubos del [place].   Si su medicamento es muy caro, por favor, pngase en contacto con Rolm Gala llamando al [phone] y presione la opcin 4 o envenos un mensaje a travs de Clinical cytogeneticist.   No podemos decirle cul ser su copago por los medicamentos por adelantado ya que esto es diferente dependiendo de la cobertura de su seguro. Sin embargo, es posible que podamos encontrar un medicamento sustituto a  Audiological scientist un formulario para que el seguro cubra el medicamento que se considera necesario.   Si se requiere una autorizacin previa para que su compaa de seguros [place] su medicamento, por favor permtanos de 1 a 2 das hbiles para completar [address]  Los precios de los medicamentos varan con frecuencia dependiendo del Environmental consultant de dnde se surte la receta y alguna farmacias pueden ofrecer precios ms baratos.  El sitio web www.goodrx.com tiene cupones para medicamentos de Health and safety inspector. Los precios aqu no tienen en cuenta lo que podra costar con la ayuda del seguro (puede ser ms barato con su seguro), pero el sitio web puede darle el precio si no utiliz Tourist information centre manager.  - Puede imprimir el cupn correspondiente y llevarlo con su receta a la farmacia.  - Tambin puede pasar por nuestra oficina durante el horario de atencin regular y Education officer, museum una tarjeta de cupones de GoodRx.  - Si necesita que su receta se enve electrnicamente a una farmacia diferente, informe a nuestra oficina a travs de MyChart de  o por telfono llamando al [phone] y presione la opcin 4.

## 2023-01-28 NOTE — Progress Notes (Signed)
Follow-Up Visit   Subjective  Jordan Figueroa is a 89 y.o. male who presents for the following: Skin Cancer Screening and Upper Body Skin Exam Hx of aks, hx of melanoma, hx of bcc, hx of scc  Bumps at both shoulders that he noticed 2 months ago.   The patient presents for Upper Body Skin Exam (UBSE) and b/l lower legs for skin cancer screening and mole check. The patient has spots, moles and lesions to be evaluated, some may be new or changing and the patient may have concern these could be cancer.    The following portions of the chart were reviewed this encounter and updated as appropriate: medications, allergies, medical history  Review of Systems:  No other skin or systemic complaints except as noted in HPI or Assessment and Plan.  Objective  Well appearing patient in no apparent distress; mood and affect are within normal limits.  All skin waist up examined. Relevant physical exam findings are noted in the Assessment and Plan.  back x 30, (30) Erythematous stuck-on, waxy papule or plaque scalp x 4, right earlobe x 1, left forehead x 1 (5) Erythematous thin papules/macules with gritty scale.   Assessment & Plan   INFLAMED SEBORRHEIC KERATOSIS (30) back x 30, (30) Symptomatic, irritating, patient would like treated. Destruction of lesion - back x 30, (30) Complexity: simple   Destruction method: cryotherapy   Informed consent: discussed and consent obtained   Timeout:  patient name, date of birth, surgical site, and procedure verified Lesion destroyed using liquid nitrogen: Yes   Region frozen until ice ball extended beyond lesion: Yes   Cryo cycles: 1 or 2. Outcome: patient tolerated procedure well with no complications   Post-procedure details: wound care instructions given   ACTINIC KERATOSIS (5) scalp x 4, right earlobe x 1, left forehead x 1 (5) Actinic keratoses are precancerous spots that appear secondary to cumulative UV radiation exposure/sun exposure over  time. They are chronic with expected duration over 1 year. A portion of actinic keratoses will progress to squamous cell carcinoma of the skin. It is not possible to reliably predict which spots will progress to skin cancer and so treatment is recommended to prevent development of skin cancer.  Recommend daily broad spectrum sunscreen SPF 30+ to sun-exposed areas, reapply every 2 hours as needed.  Recommend staying in the shade or wearing long sleeves, sun glasses (UVA+UVB protection) and wide brim hats (4-inch brim around the entire circumference of the hat). Call for new or changing lesions. Destruction of lesion - scalp x 4, right earlobe x 1, left forehead x 1 (5) Complexity: simple   Destruction method: cryotherapy   Informed consent: discussed and consent obtained   Timeout:  patient name, date of birth, surgical site, and procedure verified Lesion destroyed using liquid nitrogen: Yes   Region frozen until ice ball extended beyond lesion: Yes   Cryo cycles: 1 or 2. Outcome: patient tolerated procedure well with no complications   Post-procedure details: wound care instructions given   MULTIPLE BENIGN NEVI   LENTIGINES   ACTINIC ELASTOSIS   SEBORRHEIC KERATOSES   CHERRY ANGIOMA   Skin cancer screening performed today.    Actinic Damage - Chronic condition, secondary to cumulative UV/sun exposure - diffuse scaly erythematous macules with underlying dyspigmentation - Recommend daily broad spectrum sunscreen SPF 30+ to sun-exposed areas, reapply every 2 hours as needed.  - Staying in the shade or wearing long sleeves, sun glasses (UVA+UVB protection) and wide brim hats (  4-inch brim around the entire circumference of the hat) are also recommended for sun protection.  - Call for new or changing lesions.  Lentigines, Seborrheic Keratoses, Hemangiomas - Benign normal skin lesions - Benign-appearing - Call for any changes  Melanocytic Nevi - Tan-brown and/or  pink-flesh-colored symmetric macules and papules - Benign appearing on exam today - Observation - Call clinic for new or changing moles - Recommend daily use of broad spectrum spf 30+ sunscreen to sun-exposed areas.   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  - Call if any new or changing lesions are noted between office visits    Return in about 1 year (around 01/28/2024) for TBSE.  I, Asher Muir, CMA, am acting as scribe for Elie Goody, MD.   Documentation: I have reviewed the above documentation for accuracy and completeness, and I agree with the above.  Elie Goody, MD

## 2023-02-10 ENCOUNTER — Encounter: Payer: Self-pay | Admitting: Internal Medicine

## 2023-02-10 ENCOUNTER — Ambulatory Visit: Payer: Medicare Other | Attending: Internal Medicine | Admitting: Internal Medicine

## 2023-02-10 VITALS — BP 110/62 | HR 65 | Ht 71.0 in | Wt 211.4 lb

## 2023-02-10 DIAGNOSIS — R079 Chest pain, unspecified: Secondary | ICD-10-CM | POA: Insufficient documentation

## 2023-02-10 DIAGNOSIS — I480 Paroxysmal atrial fibrillation: Secondary | ICD-10-CM | POA: Diagnosis present

## 2023-02-10 DIAGNOSIS — I5032 Chronic diastolic (congestive) heart failure: Secondary | ICD-10-CM | POA: Diagnosis present

## 2023-02-10 DIAGNOSIS — I471 Supraventricular tachycardia, unspecified: Secondary | ICD-10-CM | POA: Diagnosis present

## 2023-02-10 DIAGNOSIS — I2510 Atherosclerotic heart disease of native coronary artery without angina pectoris: Secondary | ICD-10-CM | POA: Diagnosis present

## 2023-02-10 DIAGNOSIS — I453 Trifascicular block: Secondary | ICD-10-CM | POA: Insufficient documentation

## 2023-02-10 NOTE — Progress Notes (Signed)
Cardiology Office Note:  .   Date:  02/10/2023  ID:  Jordan Figueroa, DOB 1927-11-14, MRN [id number] PCP: Jordan Regulus, MD  [facility] Providers Cardiologist:  Jordan Kendall, MD     History of Present Illness: .   Discussed the use of AI scribe software for clinical note transcription with the patient, who gave verbal consent to proceed.  Jordan Figueroa is a 89 y.o. male with history of nonobstructive coronary artery disease, pulmonary embolism, paroxysmal supraventricular tachycardia, questionable paroxysmal atrial fibrillation, hiatal hernia, irritable bowel syndrome, and GERD, who presents for follow-up of coronary artery disease and HFpEF.  He was last seen in our office in 10/2022 by Jordan Levering, NP, at which time he continued to note sporadic chest pain similar to prior visits.  It was not exertional and usually resolved spontaneously within 10 minutes.  He was referred for pharmacologic MPI, which was low risk.    Today, Jordan Figueroa reports his heart has been "doing good" and denies any chest discomfort. He experiences occasional shortness of breath, which he describes as "normal" and "no worse than usual." This shortness of breath occurs randomly, sometimes even when he is reading. He also reports occasional episodes of his heart "racing," which last for a couple of minutes and are not associated with any other symptoms. Jordan Figueroa also reports variable urine output with his diuretic medication, furosemide. He wears compression socks to manage leg swelling, which he feels has been well controlled. He expresses overall satisfaction with his current health status. Jordan Figueroa mentions a lack of energy and a preference for sitting rather than walking, even when shopping with his daughter. He attributes this to laziness rather than any physical discomfort or breathlessness. He also mentions having large calluses on his feet that sometimes make it difficult for him to walk, which  he manages with the help of a podiatrist.  He denies lightheadedness/dizziness and falls. He has a cane 'in case of emergencies' but does not use it regularly.     ROS: See HPI  Studies Reviewed: Marland Kitchen   EKG Interpretation Date/Time:  Wednesday February 10 2023 09:13:03 EST Ventricular Rate:  65 PR Interval:  252 QRS Duration:  140 QT Interval:  450 QTC Calculation: 468 R Axis:   -41  Text Interpretation: Sinus rhythm with 1st degree A-V block with Premature ventricular complexes Left axis deviation Right bundle branch block When compared with ECG of 09-Nov-2022 13:30, PR interval has decreased slightly Otherwise no significant change Confirmed by Jordan Figueroa, Jordan Figueroa [phone]) on 02/10/2023 9:24:47 AM    Pharmacologic MPI (11/20/2022): Low risk, probably normal pharmacologic myocardial perfusion stress test.  There is heterogeneous radiotracer uptake on the rest and stress images without obvious ischemia or scar.  LVEF 55-65% with coronary artery calcification and aortic atherosclerosis.  Hiatal hernia similar to prior study.  Overall, no significant change from last MPI on 04/12/2018.  Risk Assessment/Calculations:    CHA2DS2-VASc Score = 3   This indicates a 3.2% annual risk of stroke. The patient's score is based upon: CHF History: 1 HTN History: 0 Diabetes History: 0 Stroke History: 0 Vascular Disease History: 0 Age Score: 2 Gender Score: 0            Physical Exam:   VS:  BP 110/62 (BP Location: Left Arm, Patient Position: Sitting, Cuff Size: Normal)   Pulse 65   Ht 5\' 11"  (1.803 m)   Wt 211 lb 6.4 oz (95.9 kg)   SpO2 98%   BMI  29.48 kg/m    Wt Readings from Last 3 Encounters:  02/10/23 211 lb 6.4 oz (95.9 kg)  12/25/22 210 lb 9.6 oz (95.5 kg)  11/09/22 210 lb 9.6 oz (95.5 kg)    General:  NAD. Neck: No JVD or HJR. Lungs: Clear to auscultation bilaterally without wheezes or crackles. Heart: Regular rate and rhythm without murmurs, rubs, or gallops. Abdomen: Soft, nontender,  nondistended. Extremities: Trace pretibial edema with compression stockings in place.  ASSESSMENT AND PLAN: .    Chronic HFpEF: Jordan Figueroa has chronic pretibial edema that appears stable.  He denies significant dyspnea though he still has some fatigue consistent with NYHA class II heart failure.  We will continue furosemide 40 mg daily.  Chest pain and nonobstructive coronary artery disease: No further chest pain reported.  Myocardial perfusion stress test last fall was reassuring without evidence of significant ischemia or scar.  No further workup recommended at this time.  Paroxysmal atrial fibrillation and paroxysmal supraventricular tachycardia: History of atrial fibrillation remains somewhat; we have not been able to documented in the past.  Event monitor in 10/2021 did not show any evidence of atrial fibrillation; several episodes of brief NSVT and PSVT were observed.  Jordan Figueroa notes some self-limited palpitations lasting a few minutes at a time without associated symptoms.  As he is already on apixaban given that he also has a history of VTE, we will defer medication changes.  Ongoing management of anticoagulation per Jordan Figueroa.  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  [name] does not report any symptoms to suggest high-grade AV block.  AV nodal blocking agents should continue to be avoided.  We will continue with serial EKG monitoring, at least every 6 months, to ensure that he does not have progression of his conduction disease.  I advised him to contact us or seek immediate medical attention where he to develop significant dizziness/lightheadedness or fatigue.    Dispo: Return to clinic in 6 months.  Signed, Jordan Kendall, MD

## 2023-02-10 NOTE — Patient Instructions (Signed)
Medication Instructions:  Your physician recommends that you continue on your current medications as directed. Please refer to the Current Medication list given to you today.   *If you need a refill on your cardiac medications before your next appointment, please call your pharmacy*   Lab Work: No labs ordered today    Testing/Procedures: No test ordered today    Follow-Up: At [facility], you and your health needs are our priority.  As part of our continuing mission to provide you with exceptional heart care, we have created designated Provider Care Teams.  These Care Teams include your primary Cardiologist (physician) and Advanced Practice Providers (APPs -  Physician Assistants and Nurse Practitioners) who all work together to provide you with the care you need, when you need it.  We recommend signing up for the patient portal called "MyChart".  Sign up information is provided on this After Visit Summary.  MyChart is used to connect with patients for Virtual Visits (Telemedicine).  Patients are able to view lab/test results, encounter notes, upcoming appointments, etc.  Non-urgent messages can be sent to your provider as well.   To learn more about what you can do with MyChart, go to [email or web address].    Your next appointment:   6 month(s)  Provider:   You may see Yvonne Kendall, MD or one of the following Advanced Practice Providers on your designated Care Team:   Nicolasa Ducking, NP Eula Listen, PA-C Cadence Fransico Michael, PA-C Charlsie Quest, NP Carlos Levering, NP

## 2023-02-11 ENCOUNTER — Ambulatory Visit: Payer: Medicare Other | Admitting: Urology

## 2023-02-12 ENCOUNTER — Encounter: Payer: Self-pay | Admitting: Urology

## 2023-02-12 ENCOUNTER — Ambulatory Visit (INDEPENDENT_AMBULATORY_CARE_PROVIDER_SITE_OTHER): Payer: Medicare Other | Admitting: Urology

## 2023-02-12 VITALS — BP 146/64 | HR 70 | Ht 71.0 in | Wt 211.0 lb

## 2023-02-12 DIAGNOSIS — N138 Other obstructive and reflux uropathy: Secondary | ICD-10-CM | POA: Diagnosis not present

## 2023-02-12 DIAGNOSIS — N401 Enlarged prostate with lower urinary tract symptoms: Secondary | ICD-10-CM

## 2023-02-12 LAB — BLADDER SCAN AMB NON-IMAGING: Scan Result: 0

## 2023-02-12 NOTE — Progress Notes (Signed)
I, Jordan Figueroa, acting as a scribe for Riki Altes, MD., have documented all relevant documentation on the behalf of Riki Altes, MD, as directed by Riki Altes, MD while in the presence of Riki Altes, MD.  02/12/2023 11:04 AM   Jordan Figueroa October 28, 1927 [id number]  Referring provider: Lauro Regulus, MD [address] [facility] [place],  [place] [address]  Chief Complaint  Patient presents with   Benign Prostatic Hypertrophy   Urologic history: 1.  BPH with lower urinary tract symptoms Finasteride 5 mg daily   2.  Elevated PSA Biopsy 2006 PSA 6.2 with benign pathology Discontinued PSA testing 2015 (uncorrected PSA 1.5)  HPI: Jordan Figueroa is a 89 y.o. male presents for annual follow-up.  Doing well since last visit No bothersome LUTS Denies dysuria, gross hematuria Denies flank, abdominal or pelvic pain  IPSS today 1/35   PMH: Past Medical History:  Diagnosis Date   Arthritis    osteo-right hip   Basal cell carcinoma 05/29/2020   right forehead, left axilla, both tx'd with [facility] 06/25/2020   Cancer (HCC)    skin   Colon polyps    Diastolic dysfunction    a. 03/2015 Echo: EF 60-65%, Gr1 DD, mild MR, nl RV fxn, mild PAH; b. 06/2015 RHC: minimal elevated PCWP; c.  04/2017 Echo: EF 60-65%, no rwma, GR1 DD, triv AI, mild MR, mildly dil LA, nl RV fxn, nl PASP; d. 10/2021 Echo: EF 60-65%, no rwma, GrI DD, nl RV fxn, RVSP 40.65mmHg. Mildly dil LA, triv AI, Ao sclerosis. Ao root 38mm.   Dysrhythmia    GERD (gastroesophageal reflux disease)    Hemorrhoids    Hiatal hernia    HOH (hard of hearing)    aides   Hypertension    IBS (irritable bowel syndrome)    Melanoma (HCC) 09/26/2012   vertex scalp. MM in situ.   Non-obstructive CAD (coronary artery disease)    a. 06/2015 Cath: LM 5, LAD 5ost, 30p, 58m, LCX nl, OM1 min irregs, OM2 nl, RCA 20p, 73m, RPDA/RPL1 min irregs, RPAV nl; b. 06/2017 Lexiscan MV: EF 55-65%, low risk.   PAF  (paroxysmal atrial fibrillation) (HCC)    a. Reported - no documentation in our records. Never seen on monitoring.   Paroxysmal supraventricular tachycardia (HCC) 03/2015   First documented during episode of influenza   Pulmonary embolism (HCC)    a. Chronic eliquis.   PVC's (premature ventricular contractions)    a. 06/2017 24h Holter: Avg HR 67 (49-110), rare PACs up to 5 beat run, freq PVC's - 8% burden; b. 08/2017 Zio: Avg HR 64 (48-171), occas PACs & PVCs. 4 episodes of NSVT (4 beats longest). 48 episodes of SVT up to 12.4 secs, max rate 171; c. 09/2021 Zio: predom RSR @ 65 (50-106), occas PACs (3.1%), freq PVCs (16.5%), 8 NSVT (longest 13 beats, max HR 197). 12 SVT runs (longest 5 beats, max HR 193).   Squamous cell carcinoma of skin 04/20/2014   Right 3rd finger. WD SCC.     Surgical History: Past Surgical History:  Procedure Laterality Date   CARDIAC CATHETERIZATION N/A 06/28/2015   Procedure: Left Heart Cath and Coronary Angiography;  Surgeon: Marykay Lex, MD;  Location: [facility] INVASIVE CV LAB;  Service: Cardiovascular;  Laterality: N/A;; proximal and mid RCA focal 20-25% lesions. Mild diffuse calcifications in the left main and proximal LAD ~30%, LVEDP 18 mmHg   CARDIAC CATHETERIZATION  06/28/2015  Procedure: Right Heart Cath;  Surgeon: Marykay Lex, MD;  Location: [facility] INVASIVE CV LAB;  Service: Cardiovascular;; essentially normal right heart cath pressures. Cardiac Output 4.74.Normal wedge pressure roughly 12 mmHg.    COLON SURGERY  07/14/2013   small bowel obstruction   ESOPHAGOGASTRODUODENOSCOPY (EGD) WITH PROPOFOL N/A 08/26/2018   Procedure: ESOPHAGOGASTRODUODENOSCOPY (EGD) WITH PROPOFOL;  Surgeon: Wyline Mood, MD;  Location: [facility] ENDOSCOPY;  Service: Gastroenterology;  Laterality: N/A;   EYE SURGERY Bilateral    cataracts   HEMORROIDECTOMY     MASS EXCISION Left 03/03/2019   Procedure: EXCISION MASS;  Surgeon: Sung Amabile, DO;  Location: [facility] ORS;  Service: General;  Laterality:  Left;   SKIN SURGERY     TRANSTHORACIC ECHOCARDIOGRAM  04/10/2015   EF 60-65%. No RWMA, GR 1 DD. Mild PA pressure elevation of 38 mmHg.    Home Medications:  Allergies as of 02/12/2023   No Known Allergies      Medication List        Accurate as of February 12, 2023 11:04 AM. If you have any questions, ask your nurse or doctor.          apixaban 5 MG Tabs tablet Commonly known as: Eliquis Take 1 tablet (5 mg total) by mouth 2 (two) times daily.   finasteride 5 MG tablet Commonly known as: PROSCAR Take 1 tablet (5 mg total) by mouth daily.   furosemide 40 MG tablet Commonly known as: LASIX Take 1 tablet (40 mg total) by mouth daily.   gabapentin 100 MG capsule Commonly known as: NEURONTIN Take 100 mg by mouth 3 (three) times daily.   nitroGLYCERIN 0.4 MG SL tablet Commonly known as: Nitrostat Place 1 tablet (0.4 mg total) under the tongue every 5 (five) minutes as needed for chest pain. Maximum of 3 doses.        Allergies: No Known Allergies  Family History: Family History  Problem Relation Age of Onset   CVA Father    CAD Father    Alzheimer's disease Mother    Heart Problems Sister    Heart attack Brother    CAD Brother    Prostate cancer Neg Hx    Bladder Cancer Neg Hx    Kidney cancer Neg Hx     Social History:  reports that he quit smoking about 49 years ago. His smoking use included cigarettes. He started smoking about 75 years ago. He has a 50 pack-year smoking history. He has never used smokeless tobacco. He reports that he does not drink alcohol and does not use drugs.   Physical Exam: BP (!) 146/64   Pulse 70   Ht 5\' 11"  (1.803 m)   Wt 211 lb (95.7 kg)   BMI 29.43 kg/m   Constitutional:  Alert and oriented, No acute distress. HEENT: [place] AT Respiratory: Normal respiratory effort, no increased work of breathing. Psychiatric: Normal mood and affect.  Assessment & Plan:    1. BPH with LUTS Doing well on finasteride PVR today 0 mL   Continue annual follow-up.  I have reviewed the above documentation for accuracy and completeness, and I agree with the above.   Riki Altes, MD  [facility] [address], [address] [place], [place] [address] [phone]

## 2023-02-18 ENCOUNTER — Ambulatory Visit: Payer: Medicare Other | Admitting: Urology

## 2023-03-01 ENCOUNTER — Encounter: Payer: Self-pay | Admitting: Podiatry

## 2023-03-01 ENCOUNTER — Ambulatory Visit (INDEPENDENT_AMBULATORY_CARE_PROVIDER_SITE_OTHER): Payer: Medicare Other | Admitting: Podiatry

## 2023-03-01 VITALS — Ht 71.0 in | Wt 211.0 lb

## 2023-03-01 DIAGNOSIS — B351 Tinea unguium: Secondary | ICD-10-CM

## 2023-03-01 DIAGNOSIS — L84 Corns and callosities: Secondary | ICD-10-CM | POA: Diagnosis not present

## 2023-03-01 DIAGNOSIS — M79674 Pain in right toe(s): Secondary | ICD-10-CM

## 2023-03-01 DIAGNOSIS — M79675 Pain in left toe(s): Secondary | ICD-10-CM | POA: Diagnosis not present

## 2023-03-01 DIAGNOSIS — D689 Coagulation defect, unspecified: Secondary | ICD-10-CM

## 2023-03-02 ENCOUNTER — Ambulatory Visit (INDEPENDENT_AMBULATORY_CARE_PROVIDER_SITE_OTHER): Payer: Medicare Other | Admitting: Podiatry

## 2023-03-02 ENCOUNTER — Encounter: Payer: Self-pay | Admitting: Podiatry

## 2023-03-02 DIAGNOSIS — M19071 Primary osteoarthritis, right ankle and foot: Secondary | ICD-10-CM

## 2023-03-02 MED ORDER — BETAMETHASONE SOD PHOS & ACET 6 (3-3) MG/ML IJ SUSP
3.0000 mg | Freq: Once | INTRAMUSCULAR | Status: AC
  Administered 2023-03-02: 3 mg via INTRA_ARTICULAR

## 2023-03-02 NOTE — Progress Notes (Signed)
Chief Complaint  Patient presents with   Foot Pain    "I want to get a cortisone shot in my right ankle."    SUBJECTIVE Patient presents to office today for follow-up evaluation of right ankle pain.  Patient states the last injection helped significantly.  Slowly over the last few months the arthritic pain to the right ankle has returned.  Requesting injection today  Past Medical History:  Diagnosis Date   Arthritis    osteo-right hip   Basal cell carcinoma 05/29/2020   right forehead, left axilla, both tx'd with [facility] 06/25/2020   Cancer (HCC)    skin   Colon polyps    Diastolic dysfunction    a. 03/2015 Echo: EF 60-65%, Gr1 DD, mild MR, nl RV fxn, mild PAH; b. 06/2015 RHC: minimal elevated PCWP; c.  04/2017 Echo: EF 60-65%, no rwma, GR1 DD, triv AI, mild MR, mildly dil LA, nl RV fxn, nl PASP; d. 10/2021 Echo: EF 60-65%, no rwma, GrI DD, nl RV fxn, RVSP 40.26mmHg. Mildly dil LA, triv AI, Ao sclerosis. Ao root 38mm.   Dysrhythmia    GERD (gastroesophageal reflux disease)    Hemorrhoids    Hiatal hernia    HOH (hard of hearing)    aides   Hypertension    IBS (irritable bowel syndrome)    Melanoma (HCC) 09/26/2012   vertex scalp. MM in situ.   Non-obstructive CAD (coronary artery disease)    a. 06/2015 Cath: LM 5, LAD 5ost, 30p, 34m, LCX nl, OM1 min irregs, OM2 nl, RCA 20p, 78m, RPDA/RPL1 min irregs, RPAV nl; b. 06/2017 Lexiscan MV: EF 55-65%, low risk.   PAF (paroxysmal atrial fibrillation) (HCC)    a. Reported - no documentation in our records. Never seen on monitoring.   Paroxysmal supraventricular tachycardia (HCC) 03/2015   First documented during episode of influenza   Pulmonary embolism (HCC)    a. Chronic eliquis.   PVC's (premature ventricular contractions)    a. 06/2017 24h Holter: Avg HR 67 (49-110), rare PACs up to 5 beat run, freq PVC's - 8% burden; b. 08/2017 Zio: Avg HR 64 (48-171), occas PACs & PVCs. 4 episodes of NSVT (4 beats longest). 48 episodes of SVT up to 12.4  secs, max rate 171; c. 09/2021 Zio: predom RSR @ 65 (50-106), occas PACs (3.1%), freq PVCs (16.5%), 8 NSVT (longest 13 beats, max HR 197). 12 SVT runs (longest 5 beats, max HR 193).   Squamous cell carcinoma of skin 04/20/2014   Right 3rd finger. WD SCC.     OBJECTIVE General Patient is awake, alert, and oriented x 3 and in no acute distress. Derm Skin is dry and supple bilateral. Negative open lesions or macerations. Remaining integument unremarkable. Nails are tender, long, thickened and dystrophic with subungual debris, consistent with onychomycosis, 1-5 bilateral. No signs of infection noted.  Hyperkeratotic preulcerative callus tissue also noted to the bilateral forefoot x4 Vasc  DP and PT pedal pulses palpable bilaterally. Temperature gradient within normal limits.  Neuro grossly intact via light touch.  Musculoskeletal Exam No symptomatic pedal deformities noted bilateral. Muscular strength within normal limits.  Tenderness with palpation to the lateral aspect of the right ankle joint  ASSESSMENT 1.  Preulcerative symptomatic callus lesions bilateral feet 2.  Capsulitis right ankle  -Patient evaluated. -Injection of 0.5 cc Celestone Soluspan injected lateral aspect of the right ankle -Continue wearing good supportive shoes and sneakers -Patient currently has appointment 05/06/2023 for routine footcare with Dr. Donzetta Matters.  Keep  that appointment    Felecia Shelling, DPM [facility]  Dr. Felecia Shelling, DPM    [address] [place], [place] [address]                Office [phone]  Fax [phone]

## 2023-03-07 ENCOUNTER — Encounter: Payer: Self-pay | Admitting: Podiatry

## 2023-03-07 NOTE — Progress Notes (Signed)
  Subjective:  Patient ID: Jordan Figueroa, male    DOB: 12-04-1927,  MRN: [id number]  Aul Mangieri presents to clinic today for at risk foot care with h/o coagulation defect and callus(es) of both feet and painful mycotic toenails that are difficult to trim. Painful toenails interfere with ambulation. Aggravating factors include wearing enclosed shoe gear. Pain is relieved with periodic professional debridement. Painful calluses are aggravated when weightbearing with and without shoegear. Pain is relieved with periodic professional debridement. Patient states his ankle is bothering him again and he needs another injection. Chief Complaint  Patient presents with   Nail Problem    Pt is here for [facility] PCP is Dr Dareen Piano and LOV was in August.   New problem(s): None.   PCP is Lauro Regulus, MD.  No Known Allergies  Review of Systems: Negative except as noted in the HPI.  Objective: No changes noted in today's physical examination. There were no vitals filed for this visit. Lorry Anastasi is a pleasant 89 y.o. male in NAD. AAO x 3.  Vascular Examination: Palpable pedal pulses. CFT immediate b/l. Pedal hair present. No edema. No pain with calf compression b/l. Skin temperature gradient WNL b/l. No varicosities noted. No cyanosis or clubbing noted.  Neurological Examination: Sensation grossly intact b/l with 10 gram monofilament. Vibratory sensation intact b/l.  Dermatological Examination: Pedal skin with normal turgor, texture and tone b/l. No open wounds nor interdigital macerations noted. Toenails 1-5 b/l thick, discolored, elongated with subungual debris and pain on dorsal palpation.   Hyperkeratotic lesion(s) submet head 1 b/l and submet head 5 b/l.  No erythema, no edema, no drainage, no fluctuance.  Musculoskeletal Examination: Muscle strength 5/5 to b/l LE.  No pain, crepitus noted b/l. Plantarflexed metatarsal(s) 1st metatarsal head of both feet and 5th metatarsal head of both  feet. Hammertoe deformity noted 2-5 b/l.Patient ambulates independently without assistive aids.   Radiographs: None  Assessment/Plan: 1. Pain due to onychomycosis of toenails of both feet   2. Callus   3. Coagulation defect [facility]    Patient was evaluated and treated. All patient's and/or POA's questions/concerns addressed on today's visit. Mycotic toenails 1-5 debrided in length and girth without incident. Callus(es) submet head 1 b/l and submet head 5 b/l pared with sharp debridement without incident. Continue soft, supportive shoe gear daily. Report any pedal injuries to medical professional. Call office if there are any questions/concerns. He will schedule appointment to see Dr. Logan Bores for injection of ankle. -Patient/POA to call should there be question/concern in the interim.   Return in about 9 weeks (around 05/03/2023).  Freddie Breech, DPM      [place] LOCATION: [address] [place] [address]                   Office [phone]   [facility] LOCATION: [address] [place], [place] [address] Office [phone]

## 2023-04-29 ENCOUNTER — Other Ambulatory Visit: Payer: Self-pay | Admitting: Podiatry

## 2023-04-29 ENCOUNTER — Other Ambulatory Visit: Payer: Self-pay | Admitting: Dermatology

## 2023-04-29 DIAGNOSIS — L739 Follicular disorder, unspecified: Secondary | ICD-10-CM

## 2023-04-29 DIAGNOSIS — B353 Tinea pedis: Secondary | ICD-10-CM

## 2023-05-06 ENCOUNTER — Encounter: Payer: Self-pay | Admitting: Podiatry

## 2023-05-06 ENCOUNTER — Ambulatory Visit (INDEPENDENT_AMBULATORY_CARE_PROVIDER_SITE_OTHER): Payer: Medicare Other | Admitting: Podiatry

## 2023-05-06 DIAGNOSIS — M79675 Pain in left toe(s): Secondary | ICD-10-CM

## 2023-05-06 DIAGNOSIS — M79674 Pain in right toe(s): Secondary | ICD-10-CM | POA: Diagnosis not present

## 2023-05-06 DIAGNOSIS — B351 Tinea unguium: Secondary | ICD-10-CM | POA: Diagnosis not present

## 2023-05-06 DIAGNOSIS — B353 Tinea pedis: Secondary | ICD-10-CM | POA: Diagnosis not present

## 2023-05-06 DIAGNOSIS — D689 Coagulation defect, unspecified: Secondary | ICD-10-CM

## 2023-05-06 DIAGNOSIS — L84 Corns and callosities: Secondary | ICD-10-CM | POA: Diagnosis not present

## 2023-05-06 MED ORDER — KETOCONAZOLE 2 % EX CREA: TOPICAL_CREAM | CUTANEOUS | 1 refills | Status: AC

## 2023-05-06 NOTE — Progress Notes (Signed)
 Subjective:  Patient ID: Jordan Figueroa, male    DOB: 1927-02-10,  MRN: [id number]  89 y.o. male presents to clinic with  at risk foot care with h/o coagulation defect and callus(es) left foot and right foot and painful mycotic toenails that are difficult to trim. Painful toenails interfere with ambulation. Aggravating factors include wearing enclosed shoe gear. Pain is relieved with periodic professional debridement. Painful calluses are aggravated when weightbearing with and without shoegear. Pain is relieved with periodic professional debridement.  Chief Complaint  Patient presents with   Callouses    "Get the calluses trimmed.  I don't know if the toenails need it or not."   New problem(s): None   PCP is Jimmy Moulding, MD.  No Known Allergies  Review of Systems: Negative except as noted in the HPI.   Objective:  Jordan Figueroa is a pleasant 89 y.o. male in NAD. AAO x 3.  Vascular Examination: Vascular status intact b/l with palpable pedal pulses. CFT immediate b/l. No edema. No pain with calf compression b/l. Skin temperature gradient WNL b/l. Pedal hair sparse. No cyanosis or clubbing noted b/l LE.  Neurological Examination: Sensation grossly intact b/l with 10 gram monofilament. Vibratory sensation intact b/l.  Dermatological Examination: Pedal skin with normal turgor, texture and tone b/l. No open wounds nor interdigital macerations noted. Toenails 1-5 b/l thick, discolored, elongated with subungual debris and pain on dorsal palpation.   Hyperkeratotic lesion(s) submet head 1 b/l and submet head 5 b/l.  No erythema, no edema, no drainage, no fluctuance.  Diffuse scaling noted peripherally and plantarly b/l feet.  No interdigital macerations.  No blisters, no weeping. No signs of secondary bacterial infection noted.  Musculoskeletal Examination: Muscle strength 5/5 to b/l LE.  No pain, crepitus noted b/l. Plantarflexed metatarsal(s) 1st metatarsal head of both feet and  5th metatarsal head of both feet. Hammertoe deformity noted 2-5 b/l.Patient ambulates independently without assistive aids.   Radiographs: None  Last A1c:       No data to display           Assessment:   1. Pain due to onychomycosis of toenails of both feet   2. Callus   3. Tinea pedis of right foot   4. Coagulation defect (HCC)    Plan:   Meds ordered this encounter  Medications   ketoconazole  (NIZORAL ) 2 % cream    Sig: Apply to both feet and between toes once daily for 6 weeks.    Dispense:  60 g    Refill:  1  Patient examined. All patient's and/or POA's questions/concerns addressed on today's visit.Toenails 1-5 debrided in length and girth without incident. Callus(es) submet head 1 left foot, submet head 1 right foot, submet head 5 left foot, and submet head 5 right foot pared with sharp debridement without incident. -Discussed tinea pedis infection. To prevent re-infection of tinea pedis, patient/POA/caregiver instructed to spray shoes with Lysol every evening and clean tub/shower with bleach based cleanser. Rx sent to pharmacy for Ketoconazole  Cream 2% to be applied to both feet and between toes once daily for six week.  -Patient/POA to call should there be question/concern in the interim.here are any questions/concerns  Return in about 9 weeks (around 07/08/2023).  Luella Sager, DPM       LOCATION: [address]  [place], [place] [address]                   Office [phone]   [facility] LOCATION: [address] [place], [place] [address] Office [phone]

## 2023-05-06 NOTE — Patient Instructions (Signed)
 To prevent reinfection, spray shoes with lysol every evening.  Clean tub or shower with bleach based cleanser.  Athlete's Foot Athlete's foot (tinea pedis) is a fungal infection of the skin on your feet. It often occurs on the skin that is between or underneath the toes. It can also occur on the soles of your feet. The infection can spread from person to person (is contagious). It can also spread when a person's bare feet come in contact with the fungus on shower floors or on items such as shoes. What are the causes? This condition is caused by a fungus that grows in warm, moist places. You can get athlete's foot by sharing shoes, shower stalls, towels, and wet floors with someone who is infected. Not washing your feet or changing your socks often enough can also lead to athlete's foot. What increases the risk? This condition is more likely to develop in: Men. People who have a weak body defense system (immune system). People who have diabetes. People who use public showers, such as at a gym. People who wear heavy-duty shoes, such as industrial or military shoes. Seasons with warm, humid weather. What are the signs or symptoms? Symptoms of this condition include: Itchy areas between your toes or on the soles of your feet. White, flaky, or scaly areas between your toes or on the soles of your feet. Very itchy small blisters between your toes or on the soles of your feet. Small cuts in your skin. These cuts can become infected. Thick or discolored toenails. How is this diagnosed? This condition may be diagnosed with a physical exam and a review of your medical history. Your health care provider may also take a skin or toenail sample to examine under a microscope. How is this treated? This condition is treated with antifungal medicines. These may be applied as powders, ointments, or creams. In severe cases, an oral antifungal medicine may be given. Follow these instructions at  home: Medicines Apply or take over-the-counter and prescription medicines only as told by your health care provider. Apply your antifungal medicine as told by your health care provider. Do not stop using the antifungal even if your condition improves. Foot care Do not scratch your feet. Keep your feet dry: Wear cotton or wool socks. Change your socks every day or if they become wet. Wear shoes that allow air to flow, such as sandals or canvas tennis shoes. Wash and dry your feet, including the area between your toes. Also, wash and dry your feet: Every day or as told by your health care provider. After exercising. General instructions Do not let others use towels, shoes, nail clippers, or other personal items that touch your feet. Protect your feet by wearing sandals in wet areas, such as locker rooms and shared showers. Keep all follow-up visits. This is important. If you have diabetes, keep your blood sugar under control. Contact a health care provider if: You have a fever. You have swelling, soreness, warmth, or redness in your foot. Your feet are not getting better with treatment. Your symptoms get worse. You have new symptoms. You have severe pain. Summary Athlete's foot (tinea pedis) is a fungal infection of the skin on your feet. It often occurs on skin that is between or underneath the toes. This condition is caused by a fungus that grows in warm, moist places. Symptoms include white, flaky, or scaly areas between your toes or on the soles of your feet. This condition is treated with antifungal medicines.   Keep your feet clean. Always dry them thoroughly. This information is not intended to replace advice given to you by your health care provider. Make sure you discuss any questions you have with your health care provider. Document Revised: 04/21/2020 Document Reviewed: 04/21/2020 Elsevier Patient Education  2024 Elsevier Inc.  

## 2023-05-14 ENCOUNTER — Ambulatory Visit

## 2023-05-14 NOTE — Progress Notes (Signed)
 Patient is not diabetic Making 1pr of custom DM inserts with offloads to 1&5 sub met BIL  MT pads as well to greater reduce pressure off MT heads in hopes to reduce callus build up  Patient has signed ppw and ABN understands is not a covered benefit as he is not diabetic

## 2023-07-08 ENCOUNTER — Ambulatory Visit (INDEPENDENT_AMBULATORY_CARE_PROVIDER_SITE_OTHER): Admitting: Podiatry

## 2023-07-08 DIAGNOSIS — D689 Coagulation defect, unspecified: Secondary | ICD-10-CM | POA: Diagnosis not present

## 2023-07-08 DIAGNOSIS — M79675 Pain in left toe(s): Secondary | ICD-10-CM

## 2023-07-08 DIAGNOSIS — B351 Tinea unguium: Secondary | ICD-10-CM | POA: Diagnosis not present

## 2023-07-08 DIAGNOSIS — L84 Corns and callosities: Secondary | ICD-10-CM | POA: Diagnosis not present

## 2023-07-08 DIAGNOSIS — M79674 Pain in right toe(s): Secondary | ICD-10-CM | POA: Diagnosis not present

## 2023-07-08 NOTE — Progress Notes (Signed)
  Subjective:  Patient ID: Jordan Figueroa, male    DOB: 10/30/1927,  MRN: [id number]  Jordan Figueroa presents to clinic today for at risk foot care with h/o coagulation defect and callus(es) of both feet and painful mycotic toenails that are difficult to trim. Painful toenails interfere with ambulation. Aggravating factors include wearing enclosed shoe gear. Pain is relieved with periodic professional debridement. Painful calluses are aggravated when weightbearing with and without shoegear. Pain is relieved with periodic professional debridement.  Chief Complaint  Patient presents with   Nail Problem    Thick painful toenails, 9 week follow up   Callouses    Bilateral 1st and 5th submet painful callus lesions   New problem(s): None.   PCP is Lenon Layman ORN, MD.LOV 03/05/2023.  No Known Allergies  Review of Systems: Negative except as noted in the HPI.  Objective: No changes noted in today's physical examination. There were no vitals filed for this visit. Jordan Figueroa is a pleasant 89 y.o. male in NAD. AAO x 3.  Vascular Examination: Capillary refill time immediate b/l. Palpable pedal pulses. Pedal hair present b/l. No pain with calf compression b/l. Skin temperature gradient WNL b/l. No cyanosis or clubbing b/l. No ischemia or gangrene noted b/l. No edema noted b/l LE.  Neurological Examination: Sensation grossly intact b/l with 10 gram monofilament. Vibratory sensation intact b/l.   Dermatological Examination: Pedal skin with normal turgor, texture and tone b/l.  No open wounds. No interdigital macerations.   Toenails 1-5 b/l thick, discolored, elongated with subungual debris and pain on dorsal palpation.   Hyperkeratotic lesion(s) submet head 1 left foot, submet head 1 right foot, submet head 5 left foot, and submet head 5 right foot.  No erythema, no edema, no drainage, no fluctuance.  Musculoskeletal Examination: Normal muscle strength 5/5 to all lower extremity muscle  groups bilaterally. Plantarflexed metatarsal(s) 1st metatarsal head b/l lower extremities and 5th metatarsal head b/l lower extremities. Hammertoe deformity noted 2-5 b/l.SABRA No pain, crepitus or joint limitation noted with ROM b/l LE.  Patient ambulates independently without assistive aids.  Radiographs: None  Assessment/Plan: 1. Pain due to onychomycosis of toenails of both feet   2. Callus   3. Coagulation defect [facility])   Consent given for treatment. Patient examined. All patient's and/or POA's questions/concerns addressed on today's visit. Mycotic toenails 1-5 debrided in length and girth without incident. Callus(es) submet head 1 b/l and submet head 5 b/l pared with sharp debridement without incident.Continue soft, supportive shoe gear daily. Report any pedal injuries to medical professional. Call office if there are any quesitons/concerns. -Patient/POA to call should there be question/concern in the interim.   Return in about 9 weeks (around 09/09/2023).  Delon LITTIE Merlin, DPM      [place] LOCATION: [address] [place] [address]                   Office [phone]   [facility] LOCATION: [address] [place], [place] [address] Office [phone]

## 2023-07-11 ENCOUNTER — Encounter: Payer: Self-pay | Admitting: Podiatry

## 2023-07-26 ENCOUNTER — Telehealth: Payer: Self-pay

## 2023-07-26 NOTE — Telephone Encounter (Signed)
 Attempted to contact pt but phone number might be out of service  Dm inserts are here

## 2023-08-16 ENCOUNTER — Encounter: Payer: Self-pay | Admitting: Urology

## 2023-08-18 ENCOUNTER — Encounter: Payer: Self-pay | Admitting: Emergency Medicine

## 2023-08-18 ENCOUNTER — Emergency Department

## 2023-08-18 ENCOUNTER — Other Ambulatory Visit: Payer: Self-pay

## 2023-08-18 ENCOUNTER — Emergency Department
Admission: EM | Admit: 2023-08-18 | Discharge: 2023-08-18 | Disposition: A | Discharge status: Home / Self Care | Attending: Emergency Medicine | Admitting: Emergency Medicine

## 2023-08-18 DIAGNOSIS — Y92002 Bathroom of unspecified non-institutional (private) residence single-family (private) house as the place of occurrence of the external cause: Secondary | ICD-10-CM | POA: Diagnosis not present

## 2023-08-18 DIAGNOSIS — I5032 Chronic diastolic (congestive) heart failure: Secondary | ICD-10-CM | POA: Diagnosis not present

## 2023-08-18 DIAGNOSIS — I2510 Atherosclerotic heart disease of native coronary artery without angina pectoris: Secondary | ICD-10-CM | POA: Diagnosis not present

## 2023-08-18 DIAGNOSIS — Y9389 Activity, other specified: Secondary | ICD-10-CM | POA: Diagnosis not present

## 2023-08-18 DIAGNOSIS — S8992XA Unspecified injury of left lower leg, initial encounter: Secondary | ICD-10-CM | POA: Diagnosis present

## 2023-08-18 DIAGNOSIS — I110 Hypertensive heart disease with heart failure: Secondary | ICD-10-CM | POA: Insufficient documentation

## 2023-08-18 DIAGNOSIS — S83402A Sprain of unspecified collateral ligament of left knee, initial encounter: Secondary | ICD-10-CM | POA: Diagnosis not present

## 2023-08-18 DIAGNOSIS — Z7901 Long term (current) use of anticoagulants: Secondary | ICD-10-CM | POA: Diagnosis not present

## 2023-08-18 DIAGNOSIS — X501XXA Overexertion from prolonged static or awkward postures, initial encounter: Secondary | ICD-10-CM | POA: Diagnosis not present

## 2023-08-18 MED ORDER — ACETAMINOPHEN ER 650 MG PO TBCR: 650.0000 mg | EXTENDED_RELEASE_TABLET | Freq: Three times a day (TID) | ORAL | 0 refills | Status: AC | PRN

## 2023-08-18 MED ORDER — ACETAMINOPHEN 325 MG PO TABS
650.0000 mg | ORAL_TABLET | Freq: Once | ORAL | Status: AC
  Administered 2023-08-18: 650 mg via ORAL
  Filled 2023-08-18: qty 2

## 2023-08-18 NOTE — ED Triage Notes (Signed)
 Patient to ED via POV for left knee pain. Pt reports he was working in the bathroom and twisted his knee. Reports hearing a pop. States unable to put pressure on knee.

## 2023-08-18 NOTE — Discharge Instructions (Addendum)
 You have been diagnosed with a sprain of collateral ligament of the left knee.  Please take acetaminophen  1 tablet every 8 hours.  Please have a follow-up with [facility] for further evaluation.  Please come back to ED or go to your PCP if you have new symptoms or symptoms worsen

## 2023-08-18 NOTE — ED Provider Notes (Signed)
 [facility] Provider Note    Event Date/Time   First MD Initiated Contact with Patient 08/18/23 1534     (approximate)   History   Knee Pain    HPI  Jordan Figueroa is a 89 y.o. male    with a past medical history of atrial fibrillation, BPH, chronic heart failure, capsulitis of right ankle, Monia, PVCs, CAD,who presents to the ED complaining of left knee pain. According to the patient, he was working in his bathroom twisting his knee, hearing  a pop with posterior intense pain.  Patient was able to walk after the incident, bearing weight on  his knee is very painful.  Patient lives with his daughter.  His daughter is here.  Patient is taking blood thinners     Patient Active Problem List   Diagnosis Date Noted   Nonobstructive atherosclerosis of coronary artery 02/10/2023   Right calf pain 03/06/2022   Unstable angina (HCC) 07/28/2018   Dysphagia 07/28/2018   Stable angina (HCC) 05/18/2018   Accelerating angina (HCC) 04/09/2018   Chronic heart failure with preserved ejection fraction (HFpEF) (HCC) 04/09/2018   PVC's (premature ventricular contractions) 04/08/2017   Coronary artery disease involving native coronary artery of native heart with angina pectoris (HCC) 04/08/2017   Near syncope 04/08/2017   Chronic anticoagulation 04/08/2017   Essential hypertension 04/08/2017   BPH with obstruction/lower urinary tract symptoms 12/22/2016   Nocturia 12/22/2016   Primary osteoarthritis of right hip 07/30/2016   Health care maintenance 07/24/2015   Diastolic dysfunction with chronic heart failure (HCC) - Mild 07/03/2015   Abnormal nuclear stress test 06/26/2015   Chest pain of uncertain etiology 06/06/2015   History of pulmonary embolism 06/06/2015   Dyspnea on exertion 06/06/2015   Paroxysmal atrial fibrillation (HCC) 04/16/2015   Chronic pulmonary embolism (HCC) 04/16/2015   Dizziness    Influenza    PSVT (paroxysmal supraventricular tachycardia) (HCC)  04/10/2015     ROS: Patient currently denies any vision changes, tinnitus, difficulty speaking, facial droop, sore throat, chest pain, shortness of breath, abdominal pain, nausea/vomiting/diarrhea, dysuria, or weakness/numbness/paresthesias in any extremity   Physical Exam   Triage Vital Signs: ED Triage Vitals  Encounter Vitals Group     BP 08/18/23 1337 (!) 151/69     Girls Systolic BP Percentile --      Girls Diastolic BP Percentile --      Boys Systolic BP Percentile --      Boys Diastolic BP Percentile --      Pulse Rate 08/18/23 1337 80     Resp 08/18/23 1337 18     Temp 08/18/23 1337 97.7 F (36.5 C)     Temp Source 08/18/23 1337 Oral     SpO2 08/18/23 1337 97 %     Weight 08/18/23 1336 205 lb (93 kg)     Height 08/18/23 1336 5' 11 (1.803 m)     Head Circumference --      Peak Flow --      Pain Score 08/18/23 1336 10     Pain Loc --      Pain Education --      Exclude from Growth Chart --     Most recent vital signs: Vitals:   08/18/23 1337  BP: (!) 151/69  Pulse: 80  Resp: 18  Temp: 97.7 F (36.5 C)  SpO2: 97%     Physical Exam Vitals and nursing note reviewed.  During triage patient was hypertensive  Constitutional:  General: Awake and alert. No acute distress.    Appearance: Normal appearance. The patient is normal weight.      Able to speak in complete sentences without cough or dyspnea  HENT:     Head: Normocephalic and atraumatic.     Mouth: Mucous membranes are moist.  Eyes:     General: PERRL. Normal EOMs          Conjunctiva/sclera: Conjunctivae normal.  Nose No congestion/rhinorrhea  CV:                  Good peripheral perfusion.  Regular rate and rhythm  Resp:               Normal effort.  Equal breath sounds bilaterally.  Abd:                 No distention.  Soft, nontender.  No rebound or guarding.  Musculoskeletal:        General: No swelling. Normal range of motion.  Left knee: Skin is intact no evidence of ecchymosis or  hematomas.  McMurray test positive for MCL.  Flexion and extension are limited by pain.  Pulses positive.  Sensation is intact. Skin:    General: Skin is warm and dry.     Capillary Refill: Capillary refill takes less than 2 seconds.     Findings: No rash.  Neurological:     Mental Status: The patient is awake and alert. MAE spontaneously. No gross focal neurologic deficits are appreciated.  Psychiatric Mood and affect are normal. Speech and behavior are normal.  ED Results / Procedures / Treatments   Labs (all labs ordered are listed, but only abnormal results are displayed) Labs Reviewed - No data to display   EKG     RADIOLOGY I independently reviewed and interpreted imaging and agree with radiologists findings.      PROCEDURES:  Critical Care performed:   Procedures   MEDICATIONS ORDERED IN ED: Medications  acetaminophen  (TYLENOL ) tablet 650 mg (650 mg Oral Given 08/18/23 1652)      IMPRESSION / MDM / ASSESSMENT AND PLAN / ED COURSE  I reviewed the triage vital signs and the nursing notes.  Differential diagnosis includes, but is not limited to, fracture, dislocation, MCL tear, meniscus tear  Patient's presentation is most consistent with acute complicated illness / injury requiring diagnostic workup.    Jordan Figueroa is a 89 y.o., male who presents today with history pain on his left knee after twisting his knee to prevent a fall with posterior pain to bear weight.  On physical exam McMurray test positive for MCL.  Rest of the physical exam within normal limits Patient's diagnosis is consistent with left knee soft tissue injury. I independently reviewed and interpreted imaging and agree with radiologists findings ruling out fracture or dislocation.  I did review the patient's allergies and medications.The patient is in stable and satisfactory condition for discharge home  Patient will be discharged home with prescriptions for acetaminophen .  Before discharge  patient was placed on knee immobilizer. Patient is to follow up with [facility] as needed or otherwise directed. Patient is given ED precautions to return to the ED for any worsening or new symptoms. Discussed plan of care with patient, answered all of patient's questions, Patient agreeable to plan of care. Advised patient to take medications according to the instructions on the label. Discussed possible side effects of new medications. Patient verbalized understanding.    FINAL CLINICAL IMPRESSION(S) / ED DIAGNOSES  Final diagnoses:  Sprain of collateral ligament of left knee, initial encounter     Rx / DC Orders   ED Discharge Orders          Ordered    acetaminophen  (ACETAMINOPHEN  8 HOUR) 650 MG CR tablet  Every 8 hours PRN        08/18/23 1654             Note:  This document was prepared using Dragon voice recognition software and may include unintentional dictation errors.   Janit Kast, PA-C 08/18/23 1656    Dorothyann Drivers, MD 08/18/23 1723

## 2023-08-23 ENCOUNTER — Telehealth: Payer: Self-pay

## 2023-08-23 NOTE — Telephone Encounter (Signed)
 Inserts in [place] bin

## 2023-09-03 ENCOUNTER — Ambulatory Visit

## 2023-09-03 DIAGNOSIS — L84 Corns and callosities: Secondary | ICD-10-CM | POA: Diagnosis not present

## 2023-09-03 DIAGNOSIS — M2141 Flat foot [pes planus] (acquired), right foot: Secondary | ICD-10-CM

## 2023-09-03 DIAGNOSIS — M2142 Flat foot [pes planus] (acquired), left foot: Secondary | ICD-10-CM | POA: Diagnosis not present

## 2023-09-03 NOTE — Progress Notes (Signed)
 Patient presents today to pick up custom molded plastizote inserts diagnosed with Neuropathy and callous by Dr. Gaynel.   Orthotics were dispensed and fit was satisfactory. Reviewed instructions for break-in and wear. Written instructions given to patient.  Patient will follow up as needed.   Lolita Schultze CPed, CFo, CFm

## 2023-09-08 ENCOUNTER — Ambulatory Visit: Attending: Internal Medicine | Admitting: Internal Medicine

## 2023-09-08 ENCOUNTER — Encounter: Payer: Self-pay | Admitting: Internal Medicine

## 2023-09-08 VITALS — BP 120/70 | HR 64 | Ht 71.0 in | Wt 198.8 lb

## 2023-09-08 DIAGNOSIS — I5032 Chronic diastolic (congestive) heart failure: Secondary | ICD-10-CM | POA: Diagnosis not present

## 2023-09-08 DIAGNOSIS — I480 Paroxysmal atrial fibrillation: Secondary | ICD-10-CM | POA: Diagnosis not present

## 2023-09-08 DIAGNOSIS — I2510 Atherosclerotic heart disease of native coronary artery without angina pectoris: Secondary | ICD-10-CM | POA: Diagnosis not present

## 2023-09-08 DIAGNOSIS — I453 Trifascicular block: Secondary | ICD-10-CM | POA: Insufficient documentation

## 2023-09-08 DIAGNOSIS — I471 Supraventricular tachycardia, unspecified: Secondary | ICD-10-CM | POA: Insufficient documentation

## 2023-09-08 NOTE — Patient Instructions (Addendum)
 Medication Instructions:  Your physician recommends the following medication changes.  INCREASE: Take Lasix  40 mg by mouth twice daily until your leg swelling has resolved, then reduce the dose to 40 mg once daily. If you need to continue the 40 mg twice-daily dose for more than 4-5 days, please contact our office.  *If you need a refill on your cardiac medications before your next appointment, please call your pharmacy*  Lab Work: No labs ordered today    Testing/Procedures: No test ordered today   Follow-Up: At [facility], you and your health needs are our priority.  As part of our continuing mission to provide you with exceptional heart care, our providers are all part of one team.  This team includes your primary Cardiologist (physician) and Advanced Practice Providers or APPs (Physician Assistants and Nurse Practitioners) who all work together to provide you with the care you need, when you need it.  Your next appointment:   3 month(s)  Provider:   You may see Lonni Hanson, MD or one of the following Advanced Practice Providers on your designated Care Team:   Lonni Meager, NP Lesley Maffucci, PA-C Bernardino Bring, PA-C Cadence Camden, PA-C Tylene Lunch, NP Barnie Hila, NP

## 2023-09-08 NOTE — Progress Notes (Signed)
 Cardiology Office Note:  .   Date:  09/08/2023  ID:  Jordan Figueroa, DOB 23-Mar-1927, MRN [id number] PCP: Lenon Layman ORN, MD  [facility] Providers Cardiologist:  Lonni Hanson, MD     History of Present Illness: .   Jordan Figueroa is a 89 y.o. male with history of nonobstructive coronary artery disease, pulmonary embolism, paroxysmal supraventricular tachycardia, questionable paroxysmal atrial fibrillation, hiatal hernia, irritable bowel syndrome, and GERD, who presents for follow-up of coronary artery disease and HFpEF.  I last saw him in January, at which time he was feeling fairly well other than occasional episodes of shortness of breath that were no different from his baseline.  He also noted a few episodes of self-limited palpitations.  We did not make any medication changes or pursue additional testing.  Today, Jordan Figueroa reports he has been feeling fairly similar to our last visit with stable exertional dyspnea and sporadic palpitations without associated lightheadedness.  He has not had any chest pain.  He feels like his lower extremity edema is a little bit worse than baseline.  He remains on furosemide  40 mg daily with good diuresis afterwards.  He has not needed to use any nitroglycerin .  He has not had any falls.  He remains compliant with apixaban  without significant bleeding.  He has not tolerated compression stockings in the past due to discomfort from the compression.  ROS: See HPI  Studies Reviewed: SABRA   EKG Interpretation Date/Time:  Wednesday September 08 2023 16:02:18 EDT Ventricular Rate:  64 PR Interval:  262 QRS Duration:  160 QT Interval:  450 QTC Calculation: 464 R Axis:   -38  Text Interpretation: Sinus rhythm with 1st degree A-V block with occasional Premature ventricular complexes Left axis deviation Right bundle branch block Abnormal ECG When compared with ECG of 10-Feb-2023 09:13, No significant change was found Confirmed by Tiffanye Hartmann, Lonni [phone]) on  09/08/2023 4:44:34 PM    Pharmacologic MPI (11/20/2022): Low risk, probably normal pharmacologic myocardial perfusion stress test. There is heterogeneous radiotracer uptake on the rest and stress images without obvious ischemia or scar. LVEF 55-65% with coronary artery calcification and aortic atherosclerosis. Hiatal hernia similar to prior study. Overall, no significant change from last MPI on 04/12/2018.  Risk Assessment/Calculations:    CHA2DS2-VASc Score = 3   This indicates a 3.2% annual risk of stroke. The patient's score is based upon: CHF History: 1 HTN History: 0 Diabetes History: 0 Stroke History: 0 Vascular Disease History: 0 Age Score: 2 Gender Score: 0            Physical Exam:   VS:  BP 120/70 (BP Location: Left Arm, Patient Position: Sitting, Cuff Size: Normal)   Pulse 64   Ht 5' 11 (1.803 m)   Wt 198 lb 12.8 oz (90.2 kg)   SpO2 98%   BMI 27.73 kg/m    Wt Readings from Last 3 Encounters:  09/08/23 198 lb 12.8 oz (90.2 kg)  08/18/23 205 lb (93 kg)  03/01/23 211 lb (95.7 kg)    General:  NAD. Neck: No JVD or HJR. Lungs: Clear to auscultation bilaterally without wheezes or crackles. Heart: Regular rate and rhythm with with occasional extrasystoles. Abdomen: Soft, nontender, nondistended. Extremities: 1-2+ pitting edema in both calves.  ASSESSMENT AND PLAN: .    Chronic HFpEF: Jordan Figueroa reports stable exertional dyspnea but has 1-2+ pitting edema on exam today which he states is worse than his baseline.  He has NYHA class II symptoms.  Notably, his  weight is down about 13 pounds since February.  I have encouraged him to increase his furosemide  to 40 mg twice daily for the next few days to see if his edema can return to baseline.  At that point, he can go back to his 40 mg daily dosing.  If his edema does not improve or he has to remain on twice daily dosing long-term, he should let us  know so that we can repeat a BMP and also consider further medication  adjustments and repeat echocardiogram.  Nonobstructive coronary artery disease: No further chest pain reported nonischemic Myoview  in 11/2022 was reassuring.  No further testing recommended at this time.  Continue apixaban  and lieu of aspirin .  Paroxysmal atrial fibrillation and paroxysmal supraventricular tachycardia: Sporadic palpitations reported.  Exam today notable for occasional supraventricular ectopy likely corresponding to PVCs noted on EKG.  Continue apixaban  under the direction of Dr. Lenon.  Defer AV nodal blocking agents in the setting of underlying conduction disease.  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  No signs or symptoms of high-grade AV block.  Continue avoiding AV nodal blocking agents.    Dispo: Return to clinic in 3 months.  Signed, Lonni Hanson, MD

## 2023-09-09 ENCOUNTER — Encounter: Payer: Self-pay | Admitting: Podiatry

## 2023-09-09 ENCOUNTER — Ambulatory Visit (INDEPENDENT_AMBULATORY_CARE_PROVIDER_SITE_OTHER): Admitting: Podiatry

## 2023-09-09 DIAGNOSIS — L84 Corns and callosities: Secondary | ICD-10-CM | POA: Diagnosis not present

## 2023-09-09 DIAGNOSIS — D689 Coagulation defect, unspecified: Secondary | ICD-10-CM

## 2023-09-09 DIAGNOSIS — B351 Tinea unguium: Secondary | ICD-10-CM

## 2023-09-09 DIAGNOSIS — M79675 Pain in left toe(s): Secondary | ICD-10-CM

## 2023-09-09 DIAGNOSIS — M79674 Pain in right toe(s): Secondary | ICD-10-CM

## 2023-09-11 NOTE — Progress Notes (Signed)
  Subjective:  Patient ID: Jordan Figueroa, male    DOB: 03-30-1927,  MRN: [id number]  Jordan Figueroa presents to clinic today for at risk foot care with h/o coagulation defect and callus(es) of both feet and painful thick toenails that are difficult to trim. Painful toenails interfere with ambulation. Aggravating factors include wearing enclosed shoe gear. Pain is relieved with periodic professional debridement. Painful calluses are aggravated when weightbearing with and without shoegear. Pain is relieved with periodic professional debridement. Patient has received his custom orthotics. States he's only had them about one week Chief Complaint  Patient presents with   RFC    Rm3 Routine foot care/ Dr. Layman Piety last visit August 21,2025   New problem(s): None.   PCP is Piety Layman ORN, MD.  No Known Allergies  Review of Systems: Negative except as noted in the HPI.  Objective: No changes noted in today's physical examination. There were no vitals filed for this visit. Jordan Figueroa is a pleasant 89 y.o. male in NAD. AAO x 3.  Vascular Examination: Capillary refill time immediate b/l. Palpable pedal pulses. Pedal hair present b/l. No pain with calf compression b/l. Skin temperature gradient WNL b/l. No cyanosis or clubbing b/l. No ischemia or gangrene noted b/l. No edema noted b/l LE.  Neurological Examination: Sensation grossly intact b/l with 10 gram monofilament. Vibratory sensation intact b/l.   Dermatological Examination: Pedal skin with normal turgor, texture and tone b/l.  No open wounds. No interdigital macerations.   Toenails 1-5 b/l thick, discolored, elongated with subungual debris and pain on dorsal palpation.   Hyperkeratotic lesion(s) submet head 1 left foot, submet head 1 right foot, submet head 5 left foot, and submet head 5 right foot.  No erythema, no edema, no drainage, no fluctuance.  Musculoskeletal Examination: Normal muscle strength 5/5 to all lower  extremity muscle groups bilaterally. Plantarflexed metatarsal(s) 1st metatarsal head b/l lower extremities and 5th metatarsal head b/l lower extremities. Hammertoe deformity noted 2-5 b/l.SABRA No pain, crepitus or joint limitation noted with ROM b/l LE.  Patient ambulates independently without assistive aids.  Radiographs: None  Assessment/Plan: 1. Pain due to onychomycosis of toenails of both feet   2. Callus   3. Coagulation defect [facility])   Patient was evaluated and treated. All patient's and/or POA's questions/concerns addressed on today's visit. Toenails 1-5 debrided in length and girth without incident. Callus(es) submet head 1 b/l and submet head 5 b/l pared with sharp debridement without incident. Continue custom orthotics and soft, supportive shoe gear daily. Report any pedal injuries to medical professional. Call office if there are any questions/concerns. Return in about 9 weeks (around 11/11/2023).  Delon LITTIE Merlin, DPM      [facility] LOCATION: [address], [place] [address]                   Office [phone]   [facility] LOCATION: [address] [place], [place] [address] Office [phone]

## 2023-09-14 ENCOUNTER — Ambulatory Visit (INDEPENDENT_AMBULATORY_CARE_PROVIDER_SITE_OTHER): Admitting: Podiatry

## 2023-09-14 DIAGNOSIS — M19071 Primary osteoarthritis, right ankle and foot: Secondary | ICD-10-CM

## 2023-09-14 DIAGNOSIS — M216X1 Other acquired deformities of right foot: Secondary | ICD-10-CM

## 2023-09-14 NOTE — Progress Notes (Signed)
 Subjective:  Patient ID: Jordan Figueroa, male    DOB: 1927/09/17,  MRN: [id number]  Chief Complaint  Patient presents with   Injections    89 y.o. male presents with the above complaint.  Patient presents with ankle pain to the right side.  He states he was doing well for couple years and started to come back again he would like to do another injection he has secondary complaint plantarflexed with metatarsal with submetatarsal 5 pain when ambulating.  Denies any other acute complaints  Review of Systems: Negative except as noted in the HPI. Denies N/V/F/Ch.  Past Medical History:  Diagnosis Date   Arthritis    osteo-right hip   Basal cell carcinoma 05/29/2020   right forehead, left axilla, both tx'd with [facility] 06/25/2020   Cancer (HCC)    skin   Colon polyps    Diastolic dysfunction    a. 03/2015 Echo: EF 60-65%, Gr1 DD, mild MR, nl RV fxn, mild PAH; b. 06/2015 RHC: minimal elevated PCWP; c.  04/2017 Echo: EF 60-65%, no rwma, GR1 DD, triv AI, mild MR, mildly dil LA, nl RV fxn, nl PASP; d. 10/2021 Echo: EF 60-65%, no rwma, GrI DD, nl RV fxn, RVSP 40.92mmHg. Mildly dil LA, triv AI, Ao sclerosis. Ao root 38mm.   Dysrhythmia    GERD (gastroesophageal reflux disease)    Hemorrhoids    Hiatal hernia    HOH (hard of hearing)    aides   Hypertension    IBS (irritable bowel syndrome)    Melanoma (HCC) 09/26/2012   vertex scalp. MM in situ.   Non-obstructive CAD (coronary artery disease)    a. 06/2015 Cath: LM 5, LAD 5ost, 30p, 93m, LCX nl, OM1 min irregs, OM2 nl, RCA 20p, 65m, RPDA/RPL1 min irregs, RPAV nl; b. 06/2017 Lexiscan  MV: EF 55-65%, low risk.   PAF (paroxysmal atrial fibrillation) (HCC)    a. Reported - no documentation in our records. Never seen on monitoring.   Paroxysmal supraventricular tachycardia (HCC) 03/2015   First documented during episode of influenza   Pulmonary embolism (HCC)    a. Chronic eliquis .   PVC's (premature ventricular contractions)    a. 06/2017 24h Holter: Avg  HR 67 (49-110), rare PACs up to 5 beat run, freq PVC's - 8% burden; b. 08/2017 Zio: Avg HR 64 (48-171), occas PACs & PVCs. 4 episodes of NSVT (4 beats longest). 48 episodes of SVT up to 12.4 secs, max rate 171; c. 09/2021 Zio: predom RSR @ 65 (50-106), occas PACs (3.1%), freq PVCs (16.5%), 8 NSVT (longest 13 beats, max HR 197). 12 SVT runs (longest 5 beats, max HR 193).   Squamous cell carcinoma of skin 04/20/2014   Right 3rd finger. WD SCC.     Current Outpatient Medications:    acetaminophen  (ACETAMINOPHEN  8 HOUR) 650 MG CR tablet, Take 1 tablet (650 mg total) by mouth every 8 (eight) hours as needed for pain., Disp: 30 tablet, Rfl: 0   apixaban  (ELIQUIS ) 5 MG TABS tablet, Take 1 tablet (5 mg total) by mouth 2 (two) times daily., Disp: 90 tablet, Rfl: 3   clindamycin -benzoyl peroxide (BENZACLIN) gel, APPLY TO THE AFFECTED AREA ON THE SCALP TWICE A DAY, Disp: 50 g, Rfl: 6   finasteride  (PROSCAR ) 5 MG tablet, Take 1 tablet (5 mg total) by mouth daily., Disp: 90 tablet, Rfl: 3   furosemide  (LASIX ) 40 MG tablet, Take 1 tablet (40 mg total) by mouth daily., Disp: 90 tablet, Rfl: 3   gabapentin  (NEURONTIN ) 100  MG capsule, Take 100 mg by mouth 3 (three) times daily., Disp: , Rfl:    ketoconazole  (NIZORAL ) 2 % cream, Apply to both feet and between toes once daily for 6 weeks., Disp: 60 g, Rfl: 1   nitroGLYCERIN  (NITROSTAT ) 0.4 MG SL tablet, Place 1 tablet (0.4 mg total) under the tongue every 5 (five) minutes as needed for chest pain. Maximum of 3 doses., Disp: 25 tablet, Rfl: 1  Social History   Tobacco Use  Smoking Status Former   Current packs/day: 0.00   Average packs/day: 2.0 packs/day for 25.0 years (50.0 ttl pk-yrs)   Types: Cigarettes   Start date: 02/26/1948   Quit date: 02/25/1973   Years since quitting: 50.5  Smokeless Tobacco Never    No Known Allergies Objective:  There were no vitals filed for this visit. There is no height or weight on file to calculate BMI. Constitutional Well  developed. Well nourished.  Vascular Dorsalis pedis pulses palpable bilaterally. Posterior tibial pulses palpable bilaterally. Capillary refill normal to all digits.  No cyanosis or clubbing noted. Pedal hair growth normal.  Neurologic Normal speech. Oriented to person, place, and time. Epicritic sensation to light touch grossly present bilaterally.  Dermatologic Nails well groomed and normal in appearance. No open wounds. No skin lesions.  Orthopedic: Pain on palpation of right ankle joint pain with range of motion of the ankle joint deep intra-articular ankle pain noted.  No pain at the peroneal tendon Achilles tendon ATFL ligament posterior tibial tendon  Plantarflexed fifth metatarsal noted with submetatarsal 5 lesion   Radiographs: None Assessment:   1. Arthritis of right ankle   2. Plantar flexed metatarsal, right     Plan:  Patient was evaluated and treated and all questions answered.  Right plantar flex fifth metatarsal with submet 5 lesion noted - All questions and concerns were discussed with the patient in extensive detail - I briefly discussed floating osteotomy of the fifth metatarsal.  He states he will think about it we will get back to me when he is ready.  Right ankle capsulitis with underlying plantarflexed fifth metatarsal -I will Russians and concerns were discussed with the patient in extensive detail -Given the amount of pain that he is having he will benefit from a steroid injection to help decrease the acute inflammatory component associate with pain.  Patient agrees with plan like to proceed with steroid injection -A steroid injection was performed at right ankle joint using 1% plain Lidocaine  and 10 mg of Kenalog. This was well tolerated.   No follow-ups on file.

## 2023-11-15 ENCOUNTER — Ambulatory Visit (INDEPENDENT_AMBULATORY_CARE_PROVIDER_SITE_OTHER): Admitting: Podiatry

## 2023-11-15 ENCOUNTER — Encounter: Payer: Self-pay | Admitting: Podiatry

## 2023-11-15 DIAGNOSIS — D689 Coagulation defect, unspecified: Secondary | ICD-10-CM

## 2023-11-15 DIAGNOSIS — L84 Corns and callosities: Secondary | ICD-10-CM | POA: Diagnosis not present

## 2023-11-15 DIAGNOSIS — M79674 Pain in right toe(s): Secondary | ICD-10-CM

## 2023-11-15 DIAGNOSIS — M79675 Pain in left toe(s): Secondary | ICD-10-CM | POA: Diagnosis not present

## 2023-11-15 DIAGNOSIS — B351 Tinea unguium: Secondary | ICD-10-CM

## 2023-11-15 NOTE — Progress Notes (Unsigned)
  Subjective:  Patient ID: Jordan Figueroa, male    DOB: September 09, 1927,  MRN: [id number]  89 y.o. male presents to clinic with  at risk foot care with h/o coagulation defect and callus(es) b/l feet and painful mycotic toenails that are difficult to trim. Painful toenails interfere with ambulation. Aggravating factors include wearing enclosed shoe gear. Pain is relieved with periodic professional debridement. Painful calluses are aggravated when weightbearing with and without shoegear. Pain is relieved with periodic professional debridement. No chief complaint on file.    New problem(s): None   PCP is Lenon Layman ORN, MD.  No Known Allergies  Review of Systems: Negative except as noted in the HPI.   Objective:  Jordan Figueroa is a pleasant 89 y.o. male in NAD. AAO x 3.  Vascular Examination: Vascular status intact b/l with palpable pedal pulses. Pedal hair sparse. CFT immediate b/l. No edema. No pain with calf compression b/l. Skin temperature gradient WNL b/l. No ischemia or gangrene noted b/l LE. No cyanosis or clubbing noted b/l LE.  Neurological Examination: Sensation grossly intact b/l with 10 gram monofilament. Vibratory sensation intact b/l. {[id number]}  Dermatological Examination: Pedal skin with normal turgor, texture and tone b/l. Toenails 1-5 b/l thick, discolored, elongated with subungual debris and pain on dorsal palpation. Hyperkeratotic lesion(s) submet head 1 b/l and submet head 5 b/l.  No erythema, no edema, no drainage, no fluctuance.  Musculoskeletal Examination: Muscle strength 5/5 to b/l LE. Plantarflexed metatarsal(s) 1st metatarsal head b/l lower extremities and 5th metatarsal head b/l lower extremities.  Radiographs: None   Assessment:  No diagnosis found.  Plan:  Consent given for treatment. Patient examined. All patient's and/or POA's questions/concerns addressed on today's visit. Mycotic toenails 1-5 b/l debrided in length and girth without incident.  Callus(es) submet head 1 b/l and submet head 5 b/l pared with sharp debridement without incident.Continue soft, supportive shoe gear daily. Report any pedal injuries to medical professional. Call office if there are any quesitons/concerns.  No follow-ups on file.  Delon LITTIE Merlin, DPM      [facility] LOCATION: [address] [place] [address]                   Office [phone]   [facility] LOCATION: [address] [place], [place] [address] Office [phone]

## 2023-12-04 ENCOUNTER — Other Ambulatory Visit: Payer: Self-pay | Admitting: Internal Medicine

## 2023-12-09 ENCOUNTER — Other Ambulatory Visit: Payer: Self-pay | Admitting: Urology

## 2023-12-09 DIAGNOSIS — N138 Other obstructive and reflux uropathy: Secondary | ICD-10-CM

## 2023-12-17 ENCOUNTER — Ambulatory Visit: Attending: Internal Medicine | Admitting: Internal Medicine

## 2023-12-17 ENCOUNTER — Encounter: Payer: Self-pay | Admitting: Internal Medicine

## 2023-12-17 VITALS — BP 122/56 | HR 69 | Ht 71.0 in | Wt 205.4 lb

## 2023-12-17 DIAGNOSIS — I2510 Atherosclerotic heart disease of native coronary artery without angina pectoris: Secondary | ICD-10-CM | POA: Diagnosis present

## 2023-12-17 DIAGNOSIS — I5032 Chronic diastolic (congestive) heart failure: Secondary | ICD-10-CM | POA: Diagnosis present

## 2023-12-17 DIAGNOSIS — I480 Paroxysmal atrial fibrillation: Secondary | ICD-10-CM | POA: Diagnosis present

## 2023-12-17 DIAGNOSIS — I453 Trifascicular block: Secondary | ICD-10-CM | POA: Diagnosis present

## 2023-12-17 DIAGNOSIS — I471 Supraventricular tachycardia, unspecified: Secondary | ICD-10-CM | POA: Diagnosis present

## 2023-12-17 NOTE — Progress Notes (Signed)
 Cardiology Office Note:  .   Date:  12/17/2023  ID:  Jordan Figueroa, DOB 1927-04-01, MRN [id number] PCP: Lenon Layman ORN, MD  [facility] Providers Cardiologist:  Lonni Hanson, MD     History of Present Illness: .   Jordan Figueroa is a 89 y.o. male with history of nonobstructive coronary artery disease, pulmonary embolism, paroxysmal supraventricular tachycardia, questionable paroxysmal atrial fibrillation, hiatal hernia, irritable bowel syndrome, and GERD, who presents for follow-up of coronary artery disease and HFpEF.  I last saw him in August, at which time he complained of stable exertional dyspnea and sporadic palpitations associated with lightheadedness.  He noted that his chronic lower extremity edema seemed a little worse than baseline.  I asked him to increase furosemide  to 40 mg twice daily for a few days to see if his edema would return back to baseline.  Today, Mr. Reh reports that he has been feeling well.  His leg edema has returned to his baseline, and he is back to taking furosemide  40 mg daily.  He denies chest pain, shortness of breath, palpitations, and lightheadedness.  He notes that he stumbled and fell twice over the last 3-4 weeks.  He suffered abrasions on his arms and forehead but did not sustain any significant injuries.  He remains on apixaban  without significant bleeding.  ROS: See HPI  Studies Reviewed: SABRA   EKG Interpretation Date/Time:  Friday December 17 2023 07:57:02 EST Ventricular Rate:  69 PR Interval:  264 QRS Duration:  146 QT Interval:  446 QTC Calculation: 477 R Axis:   -47  Text Interpretation: Sinus rhythm with 1st degree A-V block with occasional Premature ventricular complexes and Premature atrial complexes Left axis deviation Right bundle branch block Abnormal ECG When compared with ECG of 08-Sep-2023 16:02, Premature atrial complexes are now Present Confirmed by Matther Labell, Lonni [phone]) on 12/17/2023 8:02:23 AM    Pharmacologic  MPI (11/20/2022): Low risk, probably normal pharmacologic myocardial perfusion stress test. There is heterogeneous radiotracer uptake on the rest and stress images without obvious ischemia or scar. LVEF 55-65% with coronary artery calcification and aortic atherosclerosis. Hiatal hernia similar to prior study. Overall, no significant change from last MPI on 04/12/2018.  Risk Assessment/Calculations:    CHA2DS2-VASc Score = 4   This indicates a 4.8% annual risk of stroke. The patient's score is based upon: CHF History: 1 HTN History: 0 Diabetes History: 0 Stroke History: 0 Vascular Disease History: 1 Age Score: 2 Gender Score: 0            Physical Exam:   VS:  BP (!) 122/56 (BP Location: Left Arm, Patient Position: Sitting, Cuff Size: Normal)   Pulse 69   Ht 5' 11 (1.803 m)   Wt 205 lb 6.4 oz (93.2 kg)   SpO2 98%   BMI 28.65 kg/m    Wt Readings from Last 3 Encounters:  12/17/23 205 lb 6.4 oz (93.2 kg)  09/08/23 198 lb 12.8 oz (90.2 kg)  08/18/23 205 lb (93 kg)    General:  NAD. Neck: No JVD or HJR. Lungs: Clear to auscultation bilaterally without wheezes or crackles. Heart: Regular rate and rhythm with occasional extasystoles.  No murmurs. Abdomen: Soft, nontender, nondistended. Extremities: Trace pretibial edema bilaterally.  ASSESSMENT AND PLAN: .    Chronic HFpEF: Leg edema improved and back to baseline.  No HF symptoms reported today.  Continue current dose of furosemide  40 mg PO daily.  Follow-up labs scheduled for next month with Dr. Lenon per Mr.  Kannan.  Nonobstructive coronary artery disease: No angina reported.  Continue apixaban  in lieu of aspirin .  Defer statin therapy given excellent lipid profile in 08/2023 and advanced age.  Paroxysmal atrial fibrillation, supraventricular tachycardia, and trifascicular block: Isolated PAC's and PVC's noted on today's EKG with underlying RBBB and 1st degree AVB.  No palpitations reported today nor symptoms to suggest  high-grade AV block.  We will continue apixaban  at the discretion of Dr. Lenon; defer AV-nodal blocking agents in the setting of underlying conduction disease.    Dispo: Return to clinic in 6 months.  Signed, Lonni Hanson, MD

## 2023-12-17 NOTE — Patient Instructions (Signed)

## 2024-01-02 ENCOUNTER — Emergency Department

## 2024-01-02 ENCOUNTER — Emergency Department
Admission: EM | Admit: 2024-01-02 | Discharge: 2024-01-02 | Disposition: A | Discharge status: Home / Self Care | Attending: Emergency Medicine | Admitting: Emergency Medicine

## 2024-01-02 ENCOUNTER — Other Ambulatory Visit: Payer: Self-pay

## 2024-01-02 DIAGNOSIS — I5020 Unspecified systolic (congestive) heart failure: Secondary | ICD-10-CM | POA: Diagnosis not present

## 2024-01-02 DIAGNOSIS — I110 Hypertensive heart disease with heart failure: Secondary | ICD-10-CM | POA: Insufficient documentation

## 2024-01-02 DIAGNOSIS — I480 Paroxysmal atrial fibrillation: Secondary | ICD-10-CM | POA: Insufficient documentation

## 2024-01-02 DIAGNOSIS — R059 Cough, unspecified: Secondary | ICD-10-CM | POA: Diagnosis not present

## 2024-01-02 DIAGNOSIS — Z955 Presence of coronary angioplasty implant and graft: Secondary | ICD-10-CM | POA: Insufficient documentation

## 2024-01-02 DIAGNOSIS — W19XXXA Unspecified fall, initial encounter: Secondary | ICD-10-CM | POA: Insufficient documentation

## 2024-01-02 DIAGNOSIS — R509 Fever, unspecified: Secondary | ICD-10-CM | POA: Diagnosis not present

## 2024-01-02 DIAGNOSIS — R799 Abnormal finding of blood chemistry, unspecified: Secondary | ICD-10-CM | POA: Insufficient documentation

## 2024-01-02 DIAGNOSIS — Y92009 Unspecified place in unspecified non-institutional (private) residence as the place of occurrence of the external cause: Secondary | ICD-10-CM

## 2024-01-02 DIAGNOSIS — S0990XA Unspecified injury of head, initial encounter: Secondary | ICD-10-CM | POA: Diagnosis present

## 2024-01-02 DIAGNOSIS — Z85828 Personal history of other malignant neoplasm of skin: Secondary | ICD-10-CM | POA: Insufficient documentation

## 2024-01-02 DIAGNOSIS — S0001XA Abrasion of scalp, initial encounter: Secondary | ICD-10-CM | POA: Insufficient documentation

## 2024-01-02 HISTORY — DX: Unspecified atrial fibrillation: I48.91

## 2024-01-02 LAB — COMPREHENSIVE METABOLIC PANEL WITH GFR
ALT: 12 U/L (ref 0–44)
AST: 17 U/L (ref 15–41)
Albumin: 3.9 g/dL (ref 3.5–5.0)
Alkaline Phosphatase: 98 U/L (ref 38–126)
Anion gap: 12 (ref 5–15)
BUN: 13 mg/dL (ref 8–23)
CO2: 25 mmol/L (ref 22–32)
Calcium: 8.4 mg/dL — ABNORMAL LOW (ref 8.9–10.3)
Chloride: 100 mmol/L (ref 98–111)
Creatinine, Ser: 0.87 mg/dL (ref 0.61–1.24)
GFR, Estimated: >60 mL/min
Glucose, Bld: 113 mg/dL — ABNORMAL HIGH (ref 70–99)
Potassium: 4 mmol/L (ref 3.5–5.1)
Sodium: 137 mmol/L (ref 135–145)
Total Bilirubin: 0.4 mg/dL (ref 0.0–1.2)
Total Protein: 6.5 g/dL (ref 6.5–8.1)

## 2024-01-02 LAB — CBC
HCT: 43.8 % (ref 39.0–52.0)
Hemoglobin: 14.5 g/dL (ref 13.0–17.0)
MCH: 29.4 pg (ref 26.0–34.0)
MCHC: 33.1 g/dL (ref 30.0–36.0)
MCV: 88.7 fL (ref 80.0–100.0)
Platelets: 178 K/uL (ref 150–400)
RBC: 4.94 MIL/uL (ref 4.22–5.81)
RDW: 14.1 % (ref 11.5–15.5)
WBC: 6.3 K/uL (ref 4.0–10.5)
nRBC: 0 % (ref 0.0–0.2)

## 2024-01-02 LAB — URINALYSIS, ROUTINE W REFLEX MICROSCOPIC
Bacteria, UA: NONE SEEN
Bilirubin Urine: NEGATIVE
Glucose, UA: NEGATIVE mg/dL
Hgb urine dipstick: NEGATIVE
Ketones, ur: NEGATIVE mg/dL
Leukocytes,Ua: NEGATIVE
Nitrite: NEGATIVE
Protein, ur: 30 mg/dL — AB
Specific Gravity, Urine: 1.025 (ref 1.005–1.030)
pH: 5 (ref 5.0–8.0)

## 2024-01-02 LAB — RESP PANEL BY RT-PCR (RSV, FLU A&B, COVID)  RVPGX2
Influenza A by PCR: NEGATIVE
Influenza B by PCR: NEGATIVE
Resp Syncytial Virus by PCR: NEGATIVE
SARS Coronavirus 2 by RT PCR: NEGATIVE

## 2024-01-02 MED ORDER — ALBUTEROL SULFATE (2.5 MG/3ML) 0.083% IN NEBU
5.0000 mg | INHALATION_SOLUTION | Freq: Once | RESPIRATORY_TRACT | Status: AC
  Administered 2024-01-02: 5 mg via RESPIRATORY_TRACT
  Filled 2024-01-02: qty 6

## 2024-01-02 MED ORDER — DOXYCYCLINE HYCLATE 100 MG PO TABS
100.0000 mg | ORAL_TABLET | Freq: Once | ORAL | Status: AC
  Administered 2024-01-02: 100 mg via ORAL
  Filled 2024-01-02: qty 1

## 2024-01-02 MED ORDER — METHYLPREDNISOLONE SODIUM SUCC 125 MG IJ SOLR
125.0000 mg | INTRAMUSCULAR | Status: AC
  Administered 2024-01-02: 125 mg via INTRAVENOUS
  Filled 2024-01-02: qty 2

## 2024-01-02 MED ORDER — DOXYCYCLINE HYCLATE 100 MG PO CAPS: 100.0000 mg | ORAL_CAPSULE | Freq: Two times a day (BID) | ORAL | 0 refills | Status: AC

## 2024-01-02 NOTE — ED Triage Notes (Signed)
 Pt to ED found on floor alert and oriented after unwitnessed fall today. Abrasion to forehead, skin tear to L elbow. No LOC, takes thinners. Recent URI symptoms.  Temp was 101.2 w EMS. Received IV 1g tylenol , temp in triage 99.9.  Hx a fib. Was NSR w PVCs on EMS EKG.  CBG 95, 99% RA, BP normal  Blue top sent w labs. Alert, oriented.

## 2024-01-02 NOTE — ED Provider Notes (Signed)
 "  [facility] Provider Note    Event Date/Time   First MD Initiated Contact with Patient 01/02/24 1920     (approximate)   History   Chief Complaint: Fall   HPI  Jordan Figueroa is a 89 y.o. male with a history of SVT, PAF, heart failure, hypertension who comes ED complaining of fall today.  Patient reports that he was feeling fatigued, has had cough for the last few days, and while spouse had left the house briefly, he fell to the ground and was not able to get himself back up.  Denies loss of consciousness.  Denies neck pain.  No chest pain.  Was noted to have temp of 101.2.  No dysuria.        Past Medical History:  Diagnosis Date   Arthritis    osteo-right hip   Atrial fibrillation (HCC)    Basal cell carcinoma 05/29/2020   right forehead, left axilla, both tx'd with [facility] 06/25/2020   Cancer (HCC)    skin   Colon polyps    Diastolic dysfunction    a. 03/2015 Echo: EF 60-65%, Gr1 DD, mild MR, nl RV fxn, mild PAH; b. 06/2015 RHC: minimal elevated PCWP; c.  04/2017 Echo: EF 60-65%, no rwma, GR1 DD, triv AI, mild MR, mildly dil LA, nl RV fxn, nl PASP; d. 10/2021 Echo: EF 60-65%, no rwma, GrI DD, nl RV fxn, RVSP 40.10mmHg. Mildly dil LA, triv AI, Ao sclerosis. Ao root 38mm.   Dysrhythmia    GERD (gastroesophageal reflux disease)    Hemorrhoids    Hiatal hernia    HOH (hard of hearing)    aides   Hypertension    IBS (irritable bowel syndrome)    Melanoma (HCC) 09/26/2012   vertex scalp. MM in situ.   Non-obstructive CAD (coronary artery disease)    a. 06/2015 Cath: LM 5, LAD 5ost, 30p, 90m, LCX nl, OM1 min irregs, OM2 nl, RCA 20p, 100m, RPDA/RPL1 min irregs, RPAV nl; b. 06/2017 Lexiscan  MV: EF 55-65%, low risk.   PAF (paroxysmal atrial fibrillation) (HCC)    a. Reported - no documentation in our records. Never seen on monitoring.   Paroxysmal supraventricular tachycardia 03/2015   First documented during episode of influenza   Pulmonary embolism (HCC)     a. Chronic eliquis .   PVC's (premature ventricular contractions)    a. 06/2017 24h Holter: Avg HR 67 (49-110), rare PACs up to 5 beat run, freq PVC's - 8% burden; b. 08/2017 Zio: Avg HR 64 (48-171), occas PACs & PVCs. 4 episodes of NSVT (4 beats longest). 48 episodes of SVT up to 12.4 secs, max rate 171; c. 09/2021 Zio: predom RSR @ 65 (50-106), occas PACs (3.1%), freq PVCs (16.5%), 8 NSVT (longest 13 beats, max HR 197). 12 SVT runs (longest 5 beats, max HR 193).   Squamous cell carcinoma of skin 04/20/2014   Right 3rd finger. WD SCC.     Current Outpatient Rx   Order #: [id number] Class: Normal   Order #: [id number] Class: Normal   Order #: [id number] Class: Normal   Order #: [id number] Class: Normal   Order #: [id number] Class: Normal   Order #: [id number] Class: Normal   Order #: [id number] Class: Historical Med   Order #: [id number] Class: Normal   Order #: [id number] Class: Normal    Past Surgical History:  Procedure Laterality Date   CARDIAC CATHETERIZATION N/A 06/28/2015   Procedure: Left Heart Cath and Coronary Angiography;  Surgeon: Alm LELON Clay, MD;  Location: [facility] INVASIVE CV  LAB;  Service: Cardiovascular;  Laterality: N/A;; proximal and mid RCA focal 20-25% lesions. Mild diffuse calcifications in the left main and proximal LAD ~30%, LVEDP 18 mmHg   CARDIAC CATHETERIZATION  06/28/2015   Procedure: Right Heart Cath;  Surgeon: Alm LELON Clay, MD;  Location: [facility] INVASIVE CV LAB;  Service: Cardiovascular;; essentially normal right heart cath pressures. Cardiac Output 4.74.Normal wedge pressure roughly 12 mmHg.    COLON SURGERY  07/14/2013   small bowel obstruction   ESOPHAGOGASTRODUODENOSCOPY (EGD) WITH PROPOFOL  N/A 08/26/2018   Procedure: ESOPHAGOGASTRODUODENOSCOPY (EGD) WITH PROPOFOL ;  Surgeon: Therisa Bi, MD;  Location: [facility] ENDOSCOPY;  Service: Gastroenterology;  Laterality: N/A;   EYE SURGERY Bilateral    cataracts   HEMORROIDECTOMY     MASS EXCISION Left 03/03/2019   Procedure: EXCISION MASS;   Surgeon: Tye Millet, DO;  Location: [facility] ORS;  Service: General;  Laterality: Left;   SKIN SURGERY     TRANSTHORACIC ECHOCARDIOGRAM  04/10/2015   EF 60-65%. No RWMA, GR 1 DD. Mild PA pressure elevation of 38 mmHg.    Physical Exam   Triage Vital Signs: ED Triage Vitals  Encounter Vitals Group     BP 01/02/24 1803 127/65     Girls Systolic BP Percentile --      Girls Diastolic BP Percentile --      Boys Systolic BP Percentile --      Boys Diastolic BP Percentile --      Pulse Rate 01/02/24 1803 (!) 47     Resp 01/02/24 1803 20     Temp 01/02/24 1803 99.9 F (37.7 C)     Temp Source 01/02/24 1803 Oral     SpO2 01/02/24 1803 92 %     Weight 01/02/24 1807 205 lb 4 oz (93.1 kg)     Height 01/02/24 1807 5' 11 (1.803 m)     Head Circumference --      Peak Flow --      Pain Score 01/02/24 1804 0     Pain Loc --      Pain Education --      Exclude from Growth Chart --     Most recent vital signs: Vitals:   01/02/24 1925 01/02/24 2215  BP: (!) 111/55 117/64  Pulse: 70 92  Resp: 20 20  Temp: 98.4 F (36.9 C) 98.8 F (37.1 C)  SpO2: 95% 96%    General: Awake, no distress.  CV:  Good peripheral perfusion.  Regular rate rhythm Resp:  Normal effort.  Good air movement in all lung fields.  Mild expiratory wheezing. Abd:  No distention.  Soft nontender Other:  Mid forehead abrasion.  No hematoma or bony depression.  No lacerations.  No midline spinal tenderness   ED Results / Procedures / Treatments   Labs (all labs ordered are listed, but only abnormal results are displayed) Labs Reviewed  COMPREHENSIVE METABOLIC PANEL WITH GFR - Abnormal; Notable for the following components:      Result Value   Glucose, Bld 113 (*)    Calcium 8.4 (*)    All other components within normal limits  URINALYSIS, ROUTINE W REFLEX MICROSCOPIC - Abnormal; Notable for the following components:   Color, Urine YELLOW (*)    APPearance CLEAR (*)    Protein, ur 30 (*)    All other components  within normal limits  RESP PANEL BY RT-PCR (RSV, FLU A&B, COVID)  RVPGX2  CBC  CBG MONITORING, ED     EKG Interpreted by me Atrial  fibrillation, rate of 94.  Normal axis, right bundle branch block, no acute ischemic changes.   RADIOLOGY Chest x-ray interpreted by me, unremarkable.  Radiology report reviewed CT head and cervical spine unremarkable    PROCEDURES:  Procedures   MEDICATIONS ORDERED IN ED: Medications  methylPREDNISolone  sodium succinate (SOLU-MEDROL ) 125 mg/2 mL injection 125 mg (125 mg Intravenous Given 01/02/24 2005)  albuterol  (PROVENTIL ) (2.5 MG/3ML) 0.083% nebulizer solution 5 mg (5 mg Nebulization Given 01/02/24 2006)  doxycycline  (VIBRA -TABS) tablet 100 mg (100 mg Oral Given 01/02/24 2251)     IMPRESSION / MDM / ASSESSMENT AND PLAN / ED COURSE  I reviewed the triage vital signs and the nursing notes.  DDx: Pneumonia, COVID, influenza, UTI, AKI, electrolyte derangement, anemia, intracranial hemorrhage, C-spine fracture  Patient's presentation is most consistent with acute presentation with potential threat to life or bodily function.  Patient presents with fatigue, low-grade fever, fall at home which appears mechanical.  Not septic.  Has some reactive airway disease on exam, feels better after receiving Solu-Medrol  and bronchodilators.  Workup is reassuring.  He would like to be discharged home, I think he stable to do so.  Will start doxycycline  for potential atypical pneumonia.  Return precautions discussed.       FINAL CLINICAL IMPRESSION(S) / ED DIAGNOSES   Final diagnoses:  Fall in home, initial encounter  Abrasion of scalp, initial encounter  PAF (paroxysmal atrial fibrillation) (HCC)     Rx / DC Orders   ED Discharge Orders          Ordered    doxycycline  (VIBRAMYCIN ) 100 MG capsule  2 times daily        01/02/24 2246             Note:  This document was prepared using Dragon voice recognition software and may include  unintentional dictation errors.   Viviann Pastor, MD 01/03/24 0028  "

## 2024-01-25 ENCOUNTER — Ambulatory Visit: Payer: Medicare Other | Admitting: Dermatology

## 2024-01-25 ENCOUNTER — Encounter: Payer: Self-pay | Admitting: Dermatology

## 2024-01-25 DIAGNOSIS — W908XXA Exposure to other nonionizing radiation, initial encounter: Secondary | ICD-10-CM | POA: Diagnosis not present

## 2024-01-25 DIAGNOSIS — S50312A Abrasion of left elbow, initial encounter: Secondary | ICD-10-CM

## 2024-01-25 DIAGNOSIS — L821 Other seborrheic keratosis: Secondary | ICD-10-CM

## 2024-01-25 DIAGNOSIS — D489 Neoplasm of uncertain behavior, unspecified: Secondary | ICD-10-CM

## 2024-01-25 DIAGNOSIS — L570 Actinic keratosis: Secondary | ICD-10-CM

## 2024-01-25 DIAGNOSIS — S50311A Abrasion of right elbow, initial encounter: Secondary | ICD-10-CM

## 2024-01-25 DIAGNOSIS — S80211A Abrasion, right knee, initial encounter: Secondary | ICD-10-CM

## 2024-01-25 DIAGNOSIS — Z1283 Encounter for screening for malignant neoplasm of skin: Secondary | ICD-10-CM | POA: Diagnosis not present

## 2024-01-25 DIAGNOSIS — L578 Other skin changes due to chronic exposure to nonionizing radiation: Secondary | ICD-10-CM | POA: Diagnosis not present

## 2024-01-25 DIAGNOSIS — C4442 Squamous cell carcinoma of skin of scalp and neck: Secondary | ICD-10-CM | POA: Diagnosis not present

## 2024-01-25 DIAGNOSIS — D229 Melanocytic nevi, unspecified: Secondary | ICD-10-CM

## 2024-01-25 DIAGNOSIS — R296 Repeated falls: Secondary | ICD-10-CM

## 2024-01-25 DIAGNOSIS — L814 Other melanin hyperpigmentation: Secondary | ICD-10-CM

## 2024-01-25 DIAGNOSIS — D1801 Hemangioma of skin and subcutaneous tissue: Secondary | ICD-10-CM

## 2024-01-25 DIAGNOSIS — C4492 Squamous cell carcinoma of skin, unspecified: Secondary | ICD-10-CM

## 2024-01-25 HISTORY — DX: Squamous cell carcinoma of skin, unspecified: C44.92

## 2024-01-25 NOTE — Progress Notes (Signed)
 "  Follow-Up Visit   Subjective  Jordan Figueroa is a 89 y.o. male who presents for the following: Skin Cancer Screening and Full Body Skin Exam Hx of aks, hx of melanoma, hx of bcc, hx of scc  Patient states no concerns to focus on today. Patient had 2 falls during the holidays and developed scarring on bilateral elbows and right knee.  The patient presents for Total-Body Skin Exam (TBSE) for skin cancer screening and mole check. The patient has spots, moles and lesions to be evaluated, some may be new or changing and the patient may have concern these could be cancer.    The following portions of the chart were reviewed this encounter and updated as appropriate: medications, allergies, medical history  Review of Systems:  No other skin or systemic complaints except as noted in HPI or Assessment and Plan.  Objective  Well appearing patient in no apparent distress; mood and affect are within normal limits.  A full examination was performed including scalp, head, eyes, ears, nose, lips, neck, bilateral upper extremities, bilateral lower extremities, hands, feet, fingers, toes, fingernails, and toenails. All findings within normal limits unless otherwise noted below.   Relevant physical exam findings are noted in the Assessment and Plan.  Left Parietal Scalp 6 mm pink ulcerated papule  Left occipital scalp 4 mm ulcerated pink papule   Assessment & Plan   SKIN CANCER SCREENING PERFORMED TODAY.  ACTINIC DAMAGE - Chronic condition, secondary to cumulative UV/sun exposure - diffuse scaly erythematous macules with underlying dyspigmentation - Recommend daily broad spectrum sunscreen SPF 30+ to sun-exposed areas, reapply every 2 hours as needed.  - Staying in the shade or wearing long sleeves, sun glasses (UVA+UVB protection) and wide brim hats (4-inch brim around the entire circumference of the hat) are also recommended for sun protection.  - Call for new or changing  lesions.  LENTIGINES, SEBORRHEIC KERATOSES, HEMANGIOMAS - Benign normal skin lesions - Benign-appearing - Call for any changes  MELANOCYTIC NEVI - Tan-brown and/or pink-flesh-colored symmetric macules and papules - Benign appearing on exam today - Observation - Call clinic for new or changing moles - Recommend daily use of broad spectrum spf 30+ sunscreen to sun-exposed areas.   HEALING EXCORIATION -secondary to mechanical fall before the holidays Exam: Excoriation at bilateral elbows and right knee  Treatment Plan: Recommend vaseline. Call if not resolving.   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  - Call if any new or changing lesions are noted between office visits   HISTORY OF SQUAMOUS CELL CARCINOMA OF THE SKIN Right 3rd finger 04/2014 - No evidence of recurrence today - Recommend regular full body skin exams - Recommend daily broad spectrum sunscreen SPF 30+ to sun-exposed areas, reapply every 2 hours as needed.  - Call if any new or changing lesions are noted between office visits   HISTORY OF BASAL CELL CARCINOMA OF THE SKIN Right forehead Left axilla  ED&C 06/2020 for both - No evidence of recurrence today - Recommend regular full body skin exams - Recommend daily broad spectrum sunscreen SPF 30+ to sun-exposed areas, reapply every 2 hours as needed.  - Call if any new or changing lesions are noted between office visits NEOPLASM OF UNCERTAIN BEHAVIOR (2) Left Parietal Scalp - Skin / nail biopsy Type of biopsy: tangential  Informed consent: discussed and consent obtained   Timeout: patient name, date of birth, surgical site, and procedure verified   Procedure prep:  Patient was prepped and draped in usual sterile fashion Prep type:  Isopropyl alcohol Anesthesia: the lesion  was anesthetized in a standard fashion   Anesthetic:  1% lidocaine  w/ epinephrine  1-100,000 buffered w/ 8.4% NaHCO3 Instrument used: DermaBlade   Hemostasis achieved with: pressure and aluminum chloride   Outcome: patient tolerated procedure well   Post-procedure details: sterile dressing applied and wound care instructions given   Dressing type: bandage and petrolatum    Specimen 1 - Surgical pathology Differential Diagnosis: SCC vs ISK  Check Margins: No 6 mm pink ulcerated papule Left occipital scalp - Skin / nail biopsy Type of biopsy: tangential   Informed consent: discussed and consent obtained   Timeout: patient name, date of birth, surgical site, and procedure verified   Procedure prep:  Patient was prepped and draped in usual sterile fashion Prep type:  Isopropyl alcohol Anesthesia: the lesion was anesthetized in a standard fashion   Anesthetic:  1% lidocaine  w/ epinephrine  1-100,000 buffered w/ 8.4% NaHCO3 Instrument used: DermaBlade   Hemostasis achieved with: pressure and aluminum chloride   Outcome: patient tolerated procedure well   Post-procedure details: sterile dressing applied and wound care instructions given   Dressing type: bandage and petrolatum    Specimen 2 - Surgical pathology Differential Diagnosis: SCC vs ISK  Check Margins: No 4 mm ulcerated pink papule LENTIGINES   MULTIPLE BENIGN NEVI   ACTINIC ELASTOSIS   SEBORRHEIC KERATOSES   CHERRY ANGIOMA   ACTINIC KERATOSES   Return in about 1 year (around 01/24/2025) for TBSE, w/ Dr. Claudene.  IAlmetta Nora, RMA, am acting as scribe for Boneta Claudene, MD .   Documentation: I have reviewed the above documentation for accuracy and completeness, and I agree with the above.  Boneta Claudene, MD    "

## 2024-01-25 NOTE — Patient Instructions (Addendum)

## 2024-01-26 LAB — SURGICAL PATHOLOGY

## 2024-01-27 ENCOUNTER — Encounter: Payer: Self-pay | Admitting: Dermatology

## 2024-01-27 ENCOUNTER — Ambulatory Visit: Payer: Self-pay | Admitting: Dermatology

## 2024-01-27 ENCOUNTER — Ambulatory Visit: Admitting: Podiatry

## 2024-01-27 DIAGNOSIS — C4492 Squamous cell carcinoma of skin, unspecified: Secondary | ICD-10-CM

## 2024-01-27 DIAGNOSIS — M7751 Other enthesopathy of right foot: Secondary | ICD-10-CM | POA: Diagnosis not present

## 2024-01-27 DIAGNOSIS — M7752 Other enthesopathy of left foot: Secondary | ICD-10-CM

## 2024-01-27 NOTE — Telephone Encounter (Signed)
-----   Message from Boneta Sharps, MD sent at 01/27/2024 10:00 AM EST ----- Diagnosis: 1. Skin, left parietal scalp :       WELL DIFFERENTIATED SQUAMOUS CELL CARCINOMA        2. Skin, left occipital scalp :       MODERATELY DIFFERENTIATED SQUAMOUS CELL CARCINOMA   Please call with diagnosis and refer to Mohs surgery. L occipital is the priority  Both biopsies: Explanation: squamous cell skin cancer that has grown beyond the surface of the skin and is invading the second layer of the skin. It has the potential to spread beyond the skin and threaten your  health, so I recommend treating it.  Treatment: Given the location and type of skin cancer, I recommend Mohs surgery. Mohs surgery involves cutting out the skin cancer and then checking under the microscope to ensure the whole skin  cancer was removed. If any skin cancer remains, the surgeon will cut out more until it is fully removed. The cure rate is about 98-99%. Once the Mohs surgeon confirms the skin cancer is out, they  will discuss the options to repair or heal the area. You must take it easy for about two weeks after surgery (no lifting over 10-15 lbs, avoid activity to get your heart rate and blood pressure up).  It is done at another office outside of [place] ([place], [place], or [place]).

## 2024-01-27 NOTE — Progress Notes (Signed)
 "  Subjective:  Patient ID: Jordan Figueroa, male    DOB: 04-07-1927,  MRN: [id number]  Chief Complaint  Patient presents with   Arthritis    Would like an injection     89 y.o. male presents with the above complaint.  Patient presents with bilateral ankle pain to the right side.  He states is doing well.  Injection helped considerably he would like to do another one denies any other acute complaints Review of Systems: Negative except as noted in the HPI. Denies N/V/F/Ch.  Past Medical History:  Diagnosis Date   Arthritis    osteo-right hip   Atrial fibrillation (HCC)    Basal cell carcinoma 05/29/2020   right forehead, left axilla, both tx'd with [facility] 06/25/2020   Cancer (HCC)    skin   Colon polyps    Diastolic dysfunction    a. 03/2015 Echo: EF 60-65%, Gr1 DD, mild MR, nl RV fxn, mild PAH; b. 06/2015 RHC: minimal elevated PCWP; c.  04/2017 Echo: EF 60-65%, no rwma, GR1 DD, triv AI, mild MR, mildly dil LA, nl RV fxn, nl PASP; d. 10/2021 Echo: EF 60-65%, no rwma, GrI DD, nl RV fxn, RVSP 40.51mmHg. Mildly dil LA, triv AI, Ao sclerosis. Ao root 38mm.   Dysrhythmia    GERD (gastroesophageal reflux disease)    Hemorrhoids    Hiatal hernia    HOH (hard of hearing)    aides   Hypertension    IBS (irritable bowel syndrome)    Melanoma (HCC) 09/26/2012   vertex scalp. MM in situ.   Non-obstructive CAD (coronary artery disease)    a. 06/2015 Cath: LM 5, LAD 5ost, 30p, 64m, LCX nl, OM1 min irregs, OM2 nl, RCA 20p, 25m, RPDA/RPL1 min irregs, RPAV nl; b. 06/2017 Lexiscan  MV: EF 55-65%, low risk.   PAF (paroxysmal atrial fibrillation) (HCC)    a. Reported - no documentation in our records. Never seen on monitoring.   Paroxysmal supraventricular tachycardia 03/2015   First documented during episode of influenza   Pulmonary embolism (HCC)    a. Chronic eliquis .   PVC's (premature ventricular contractions)    a. 06/2017 24h Holter: Avg HR 67 (49-110), rare PACs up to 5 beat run, freq PVC's - 8%  burden; b. 08/2017 Zio: Avg HR 64 (48-171), occas PACs & PVCs. 4 episodes of NSVT (4 beats longest). 48 episodes of SVT up to 12.4 secs, max rate 171; c. 09/2021 Zio: predom RSR @ 65 (50-106), occas PACs (3.1%), freq PVCs (16.5%), 8 NSVT (longest 13 beats, max HR 197). 12 SVT runs (longest 5 beats, max HR 193).   Squamous cell carcinoma of skin 04/20/2014   Right 3rd finger. WD SCC.     Current Outpatient Medications:    acetaminophen  (ACETAMINOPHEN  8 HOUR) 650 MG CR tablet, Take 1 tablet (650 mg total) by mouth every 8 (eight) hours as needed for pain., Disp: 30 tablet, Rfl: 0   apixaban  (ELIQUIS ) 5 MG TABS tablet, Take 1 tablet (5 mg total) by mouth 2 (two) times daily., Disp: 90 tablet, Rfl: 3   clindamycin -benzoyl peroxide (BENZACLIN) gel, APPLY TO THE AFFECTED AREA ON THE SCALP TWICE A DAY (Patient not taking: Reported on 12/17/2023), Disp: 50 g, Rfl: 6   finasteride  (PROSCAR ) 5 MG tablet, TAKE 1 TABLET BY MOUTH DAILY, Disp: 90 tablet, Rfl: 3   furosemide  (LASIX ) 40 MG tablet, TAKE 1 TABLET BY MOUTH DAILY, Disp: 90 tablet, Rfl: 3   gabapentin  (NEURONTIN ) 100 MG capsule, Take 100 mg  by mouth 3 (three) times daily., Disp: , Rfl:    ketoconazole  (NIZORAL ) 2 % cream, Apply to both feet and between toes once daily for 6 weeks. (Patient not taking: Reported on 12/17/2023), Disp: 60 g, Rfl: 1   nitroGLYCERIN  (NITROSTAT ) 0.4 MG SL tablet, Place 1 tablet (0.4 mg total) under the tongue every 5 (five) minutes as needed for chest pain. Maximum of 3 doses., Disp: 25 tablet, Rfl: 1  Social History   Tobacco Use  Smoking Status Former   Current packs/day: 0.00   Average packs/day: 2.0 packs/day for 25.0 years (50.0 ttl pk-yrs)   Types: Cigarettes   Start date: 02/26/1948   Quit date: 02/25/1973   Years since quitting: 50.9  Smokeless Tobacco Never    No Known Allergies Objective:  There were no vitals filed for this visit. There is no height or weight on file to calculate BMI. Constitutional Well  developed. Well nourished.  Vascular Dorsalis pedis pulses palpable bilaterally. Posterior tibial pulses palpable bilaterally. Capillary refill normal to all digits.  No cyanosis or clubbing noted. Pedal hair growth normal.  Neurologic Normal speech. Oriented to person, place, and time. Epicritic sensation to light touch grossly present bilaterally.  Dermatologic Nails well groomed and normal in appearance. No open wounds. No skin lesions.  Orthopedic: Pain on palpation of bilateral ankle joint pain with range of motion of the ankle joint deep intra-articular ankle pain noted.  No pain at the peroneal tendon Achilles tendon ATFL ligament posterior tibial tendon  Plantarflexed fifth metatarsal noted with submetatarsal 5 lesion   Radiographs: None Assessment:   1. Capsulitis of right ankle   2. Capsulitis of left ankle      Plan:  Patient was evaluated and treated and all questions answered.  Right plantar flex fifth metatarsal with submet 5 lesion noted - All questions and concerns were discussed with the patient in extensive detail - I briefly discussed floating osteotomy of the fifth metatarsal.  He states he will think about it we will get back to me when he is ready.  Right ankle capsulitis with underlying plantarflexed fifth metatarsal -I will [place] and concerns were discussed with the patient in extensive detail -Given the amount of pain that he is having he will benefit from a steroid injection to help decrease the acute inflammatory component associate with pain.  Patient agrees with plan like to proceed with steroid injection -A steroid injection was performed at bilateral ankle joint using 1% plain Lidocaine  and 10 mg of Kenalog. This was well tolerated.   No follow-ups on file.  "

## 2024-02-08 ENCOUNTER — Ambulatory Visit: Admitting: Urology

## 2024-02-10 ENCOUNTER — Ambulatory Visit: Payer: Medicare Other | Admitting: Urology

## 2024-02-18 ENCOUNTER — Ambulatory Visit: Admitting: Podiatry

## 2024-02-23 ENCOUNTER — Ambulatory Visit: Admitting: Urology

## 2024-03-07 ENCOUNTER — Encounter: Admitting: Dermatology

## 2024-03-22 ENCOUNTER — Encounter: Admitting: Dermatology

## 2024-04-24 ENCOUNTER — Ambulatory Visit: Admitting: Podiatry

## 2025-01-29 ENCOUNTER — Ambulatory Visit: Admitting: Dermatology
